# Patient Record
Sex: Male | Born: 1965 | Race: Black or African American | Hispanic: No | Marital: Single | Smoking: Current every day smoker
Health system: Southern US, Community
[De-identification: ages and names within clinical notes are randomized; demographics above are authoritative.]

## PROBLEM LIST (undated history)

## (undated) DIAGNOSIS — R079 Chest pain, unspecified: Secondary | ICD-10-CM

## (undated) DIAGNOSIS — B1920 Unspecified viral hepatitis C without hepatic coma: Secondary | ICD-10-CM

## (undated) DIAGNOSIS — E7800 Pure hypercholesterolemia, unspecified: Secondary | ICD-10-CM

## (undated) DIAGNOSIS — R0600 Dyspnea, unspecified: Secondary | ICD-10-CM

## (undated) DIAGNOSIS — C801 Malignant (primary) neoplasm, unspecified: Secondary | ICD-10-CM

## (undated) DIAGNOSIS — I10 Essential (primary) hypertension: Secondary | ICD-10-CM

## (undated) HISTORY — PX: JOINT REPLACEMENT: SHX530

## (undated) HISTORY — PX: PARTIAL HIP ARTHROPLASTY: SHX733

## (undated) HISTORY — DX: Essential (primary) hypertension: I10

## (undated) HISTORY — DX: Pure hypercholesterolemia, unspecified: E78.00

## (undated) HISTORY — DX: Unspecified viral hepatitis C without hepatic coma: B19.20

---
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
## 1998-05-23 ENCOUNTER — Inpatient Hospital Stay (HOSPITAL_COMMUNITY): Admission: RE | Admit: 1998-05-23 | Discharge: 1998-05-27 | Payer: Self-pay | Admitting: Orthopedic Surgery

## 1998-05-23 ENCOUNTER — Encounter: Payer: Self-pay | Admitting: Orthopedic Surgery

## 1999-01-12 ENCOUNTER — Inpatient Hospital Stay (HOSPITAL_COMMUNITY): Admission: EM | Admit: 1999-01-12 | Discharge: 1999-01-16 | Payer: Self-pay | Admitting: Emergency Medicine

## 1999-01-12 ENCOUNTER — Encounter: Payer: Self-pay | Admitting: Emergency Medicine

## 1999-02-04 ENCOUNTER — Encounter: Payer: Self-pay | Admitting: Emergency Medicine

## 1999-02-04 ENCOUNTER — Emergency Department (HOSPITAL_COMMUNITY): Admission: EM | Admit: 1999-02-04 | Discharge: 1999-02-04 | Payer: Self-pay | Admitting: Emergency Medicine

## 1999-02-07 ENCOUNTER — Inpatient Hospital Stay (HOSPITAL_COMMUNITY): Admission: EM | Admit: 1999-02-07 | Discharge: 1999-02-11 | Payer: Self-pay | Admitting: Emergency Medicine

## 1999-02-25 ENCOUNTER — Encounter: Admission: RE | Admit: 1999-02-25 | Discharge: 1999-02-28 | Payer: Self-pay | Admitting: Orthopedic Surgery

## 1999-08-13 ENCOUNTER — Emergency Department (HOSPITAL_COMMUNITY): Admission: EM | Admit: 1999-08-13 | Discharge: 1999-08-13 | Payer: Self-pay | Admitting: Emergency Medicine

## 2000-08-03 ENCOUNTER — Emergency Department (HOSPITAL_COMMUNITY): Admission: EM | Admit: 2000-08-03 | Discharge: 2000-08-03 | Payer: Self-pay | Admitting: Emergency Medicine

## 2000-08-03 ENCOUNTER — Encounter: Payer: Self-pay | Admitting: Emergency Medicine

## 2001-02-17 ENCOUNTER — Encounter: Payer: Self-pay | Admitting: Orthopedic Surgery

## 2001-02-17 ENCOUNTER — Encounter: Payer: Self-pay | Admitting: Emergency Medicine

## 2001-02-17 ENCOUNTER — Inpatient Hospital Stay (HOSPITAL_COMMUNITY): Admission: EM | Admit: 2001-02-17 | Discharge: 2001-02-19 | Payer: Self-pay | Admitting: Emergency Medicine

## 2001-11-11 ENCOUNTER — Emergency Department (HOSPITAL_COMMUNITY): Admission: EM | Admit: 2001-11-11 | Discharge: 2001-11-11 | Payer: Self-pay | Admitting: Emergency Medicine

## 2001-11-11 ENCOUNTER — Encounter: Payer: Self-pay | Admitting: Emergency Medicine

## 2002-06-17 ENCOUNTER — Emergency Department (HOSPITAL_COMMUNITY): Admission: EM | Admit: 2002-06-17 | Discharge: 2002-06-18 | Payer: Self-pay | Admitting: Emergency Medicine

## 2002-06-18 ENCOUNTER — Encounter: Payer: Self-pay | Admitting: Emergency Medicine

## 2002-10-22 ENCOUNTER — Emergency Department (HOSPITAL_COMMUNITY): Admission: EM | Admit: 2002-10-22 | Discharge: 2002-10-22 | Payer: Self-pay | Admitting: Emergency Medicine

## 2002-10-22 ENCOUNTER — Encounter: Payer: Self-pay | Admitting: Emergency Medicine

## 2003-02-08 ENCOUNTER — Encounter: Payer: Self-pay | Admitting: Orthopedic Surgery

## 2003-02-08 ENCOUNTER — Ambulatory Visit (HOSPITAL_COMMUNITY): Admission: RE | Admit: 2003-02-08 | Discharge: 2003-02-08 | Payer: Self-pay | Admitting: Orthopedic Surgery

## 2003-06-12 ENCOUNTER — Emergency Department (HOSPITAL_COMMUNITY): Admission: AD | Admit: 2003-06-12 | Discharge: 2003-06-12 | Payer: Self-pay | Admitting: Family Medicine

## 2003-06-20 ENCOUNTER — Emergency Department (HOSPITAL_COMMUNITY): Admission: AD | Admit: 2003-06-20 | Discharge: 2003-06-20 | Payer: Self-pay | Admitting: Family Medicine

## 2003-07-01 ENCOUNTER — Emergency Department (HOSPITAL_COMMUNITY): Admission: AD | Admit: 2003-07-01 | Discharge: 2003-07-01 | Payer: Self-pay | Admitting: Family Medicine

## 2005-08-10 ENCOUNTER — Emergency Department (HOSPITAL_COMMUNITY): Admission: EM | Admit: 2005-08-10 | Discharge: 2005-08-10 | Payer: Self-pay | Admitting: Emergency Medicine

## 2006-05-07 ENCOUNTER — Emergency Department (HOSPITAL_COMMUNITY): Admission: EM | Admit: 2006-05-07 | Discharge: 2006-05-07 | Payer: Self-pay | Admitting: Emergency Medicine

## 2006-10-16 ENCOUNTER — Emergency Department (HOSPITAL_COMMUNITY): Admission: EM | Admit: 2006-10-16 | Discharge: 2006-10-16 | Payer: Self-pay | Admitting: Emergency Medicine

## 2006-12-01 ENCOUNTER — Inpatient Hospital Stay (HOSPITAL_COMMUNITY): Admission: EM | Admit: 2006-12-01 | Discharge: 2006-12-04 | Payer: Self-pay | Admitting: *Deleted

## 2006-12-07 ENCOUNTER — Ambulatory Visit: Payer: Self-pay | Admitting: Internal Medicine

## 2006-12-19 ENCOUNTER — Emergency Department (HOSPITAL_COMMUNITY): Admission: EM | Admit: 2006-12-19 | Discharge: 2006-12-19 | Payer: Self-pay | Admitting: Emergency Medicine

## 2006-12-23 ENCOUNTER — Ambulatory Visit: Payer: Self-pay | Admitting: Internal Medicine

## 2006-12-27 ENCOUNTER — Emergency Department (HOSPITAL_COMMUNITY): Admission: EM | Admit: 2006-12-27 | Discharge: 2006-12-27 | Payer: Self-pay | Admitting: Emergency Medicine

## 2007-01-06 ENCOUNTER — Emergency Department (HOSPITAL_COMMUNITY): Admission: EM | Admit: 2007-01-06 | Discharge: 2007-01-06 | Payer: Self-pay | Admitting: Emergency Medicine

## 2007-01-21 ENCOUNTER — Ambulatory Visit: Payer: Self-pay | Admitting: Internal Medicine

## 2007-02-10 ENCOUNTER — Ambulatory Visit: Payer: Self-pay | Admitting: Internal Medicine

## 2007-04-25 ENCOUNTER — Encounter: Admission: RE | Admit: 2007-04-25 | Discharge: 2007-04-25 | Payer: Self-pay | Admitting: Internal Medicine

## 2007-04-25 ENCOUNTER — Ambulatory Visit: Payer: Self-pay | Admitting: Internal Medicine

## 2007-04-25 LAB — CONVERTED CEMR LAB
ALT: 83 units/L — ABNORMAL HIGH (ref 0–53)
AST: 45 units/L — ABNORMAL HIGH (ref 0–37)
Alkaline Phosphatase: 44 units/L (ref 39–117)
LDL Cholesterol: 75 mg/dL (ref 0–99)
Sodium: 142 meq/L (ref 135–145)
Total Bilirubin: 0.5 mg/dL (ref 0.3–1.2)
Total CHOL/HDL Ratio: 5.4
Total Protein: 7.5 g/dL (ref 6.0–8.3)
VLDL: 67 mg/dL — ABNORMAL HIGH (ref 0–40)

## 2007-10-06 ENCOUNTER — Ambulatory Visit: Payer: Self-pay | Admitting: Internal Medicine

## 2008-04-10 ENCOUNTER — Ambulatory Visit: Payer: Self-pay | Admitting: Internal Medicine

## 2008-04-10 LAB — CONVERTED CEMR LAB
Calcium: 10.2 mg/dL (ref 8.4–10.5)
Cholesterol: 212 mg/dL — ABNORMAL HIGH (ref 0–200)
Potassium: 4.3 meq/L (ref 3.5–5.3)
Sodium: 142 meq/L (ref 135–145)
Total CHOL/HDL Ratio: 4.5
Triglycerides: 155 mg/dL — ABNORMAL HIGH (ref <150)
VLDL: 31 mg/dL (ref 0–40)

## 2008-11-29 ENCOUNTER — Ambulatory Visit: Payer: Self-pay | Admitting: Internal Medicine

## 2009-09-02 ENCOUNTER — Ambulatory Visit: Payer: Self-pay | Admitting: Internal Medicine

## 2009-09-02 LAB — CONVERTED CEMR LAB
Albumin: 4.3 g/dL (ref 3.5–5.2)
Alkaline Phosphatase: 46 units/L (ref 39–117)
BUN: 25 mg/dL — ABNORMAL HIGH (ref 6–23)
CO2: 26 meq/L (ref 19–32)
Direct LDL: 93 mg/dL
Glucose, Bld: 173 mg/dL — ABNORMAL HIGH (ref 70–99)
Hgb A1c MFr Bld: 7.1 % — ABNORMAL HIGH (ref 4.6–6.1)
Microalb, Ur: 0.5 mg/dL (ref 0.00–1.89)
Potassium: 4.2 meq/L (ref 3.5–5.3)
Total Bilirubin: 0.5 mg/dL (ref 0.3–1.2)
Total Protein: 7.4 g/dL (ref 6.0–8.3)

## 2010-07-30 ENCOUNTER — Encounter (INDEPENDENT_AMBULATORY_CARE_PROVIDER_SITE_OTHER): Payer: Self-pay | Admitting: Family Medicine

## 2010-07-30 LAB — CONVERTED CEMR LAB
ALT: 96 units/L — ABNORMAL HIGH (ref 0–53)
AST: 54 units/L — ABNORMAL HIGH (ref 0–37)
Alkaline Phosphatase: 54 units/L (ref 39–117)
BUN: 20 mg/dL (ref 6–23)
Calcium: 9.7 mg/dL (ref 8.4–10.5)
Chloride: 99 meq/L (ref 96–112)
Creatinine, Ser: 1.24 mg/dL (ref 0.40–1.50)
HDL: 33 mg/dL — ABNORMAL LOW (ref >39)
LDL Cholesterol: 99 mg/dL (ref 0–99)
Total CHOL/HDL Ratio: 5.7
VLDL: 55 mg/dL — ABNORMAL HIGH (ref 0–40)

## 2010-11-10 ENCOUNTER — Emergency Department (HOSPITAL_COMMUNITY): Payer: Medicare Other

## 2010-11-10 ENCOUNTER — Emergency Department (HOSPITAL_COMMUNITY)
Admission: EM | Admit: 2010-11-10 | Discharge: 2010-11-11 | Disposition: A | Discharge status: Home / Self Care | Payer: Medicare Other | Attending: Emergency Medicine | Admitting: Emergency Medicine

## 2010-11-10 DIAGNOSIS — M79609 Pain in unspecified limb: Secondary | ICD-10-CM | POA: Insufficient documentation

## 2010-11-10 DIAGNOSIS — I10 Essential (primary) hypertension: Secondary | ICD-10-CM | POA: Insufficient documentation

## 2010-11-10 DIAGNOSIS — E119 Type 2 diabetes mellitus without complications: Secondary | ICD-10-CM | POA: Insufficient documentation

## 2010-11-10 DIAGNOSIS — IMO0002 Reserved for concepts with insufficient information to code with codable children: Secondary | ICD-10-CM | POA: Insufficient documentation

## 2010-11-10 DIAGNOSIS — Z794 Long term (current) use of insulin: Secondary | ICD-10-CM | POA: Insufficient documentation

## 2010-11-17 ENCOUNTER — Inpatient Hospital Stay (INDEPENDENT_AMBULATORY_CARE_PROVIDER_SITE_OTHER)
Admission: RE | Admit: 2010-11-17 | Discharge: 2010-11-17 | Disposition: A | Discharge status: Home / Self Care | Payer: Medicare Other | Source: Ambulatory Visit | Attending: Emergency Medicine | Admitting: Emergency Medicine

## 2010-11-17 DIAGNOSIS — M25529 Pain in unspecified elbow: Secondary | ICD-10-CM

## 2010-12-08 ENCOUNTER — Inpatient Hospital Stay (INDEPENDENT_AMBULATORY_CARE_PROVIDER_SITE_OTHER)
Admission: RE | Admit: 2010-12-08 | Discharge: 2010-12-08 | Disposition: A | Discharge status: Home / Self Care | Payer: Medicare Other | Source: Ambulatory Visit | Attending: Emergency Medicine | Admitting: Emergency Medicine

## 2010-12-08 ENCOUNTER — Ambulatory Visit (INDEPENDENT_AMBULATORY_CARE_PROVIDER_SITE_OTHER): Payer: Medicare Other

## 2010-12-08 ENCOUNTER — Ambulatory Visit (HOSPITAL_COMMUNITY): Payer: Medicare Other

## 2010-12-08 DIAGNOSIS — IMO0002 Reserved for concepts with insufficient information to code with codable children: Secondary | ICD-10-CM

## 2010-12-19 NOTE — Op Note (Signed)
[place]  Patient:    [name], UTTER                         MRN: [id number] Proc. Date: 02/17/01 Adm. Date:  [id number] Attending:  Mayme Genta                           Operative Report  PREOPERATIVE DIAGNOSIS: Post-traumatic dislocation of right total hip.  POSTOPERATIVE DIAGNOSIS: Post-traumatic dislocation of right total hip.  OPERATION/PROCEDURE: Closed manipulative reduction under anesthesia.  SURGEON: Sharmon Revere, M.D.  ANESTHESIA: General.  DESCRIPTION OF PROCEDURE: The patient was taken to the operating room after being given adequate preoperative medication and given general anesthesia and intubated.  The right hip was manipulated with traction and countertraction. The C-arm was used to visualize reduction and the hip was reduced.  Implants remained stable.  No evidence of loosening.  The patient tolerated the procedure quite well and went to the recovery room in stable and satisfactory condition.  The patient is being kept for 23 hour observation for pain control and to start physical therapy with the use of crutches, partial weightbearing on the right side, and to use pillows in between the legs while at bed rest. The patient will be discharged tomorrow on Percocet one q.4h p.r.n. for pain. Partial weightbearing on the right side.  Return to the office in two weeks. DD:  02/17/01 TD:  02/18/01 Job: [id number]
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
## 2010-12-19 NOTE — Discharge Summary (Signed)
[name], [name]                  ACCOUNT NO.:  [id number]   MEDICAL RECORD NO.:  [id number]          PATIENT TYPE:  INP   LOCATION:  9038                         FACILITY:  [facility]   PHYSICIAN:  Leana Gamer, MDDATE OF BIRTH:  [birth year]   DATE OF ADMISSION:  12/01/2006  DATE OF DISCHARGE:                               DISCHARGE SUMMARY   DATE OF ANTICIPATED DISCHARGE:  Dec 04, 2006.   DISCHARGE DISPOSITION:  Home.   FINAL DISCHARGE DIAGNOSES:  1. Mild diabetic ketoacidosis.  2. Diabetes type 2, new diagnosis.  3. Hypertension.  4. Hyperlipidemia.  5. Alcohol abuse.  6. Tobacco use disorder.  7. Fatty liver.  8. History of psychiatric disorder, unspecified at this time.   DISCHARGE MEDICATIONS:  1. Glucotrol 5 mg p.o. b.i.d.  2. Glucophage 500 mg p.o. b.i.d.  3. Lantus insulin 20 units subcu at bedtime.  4. Lisinopril 20 mg p.o. daily.  5. Hydrochlorothiazide 12.5 mg p.o. daily.  6. Pravachol 40 mg p.o. q.h.s.   CONSULTANTS:  None.   PROCEDURE:  None.   DIAGNOSTIC STUDIES:  CT of the abdomen and pelvis, which showed a fatty  liver and a slightly prominent appendix with no adjacent inflammation.   CHIEF COMPLAINT:  Abdominal pain and vomiting x4 days.   HISTORY OF PRESENT ILLNESS:  Please see the H&P dictated by Dr. Zigmund Daniel  for details of the HPI.   HOSPITAL COURSE:  1. Diabetic ketoacidosis.  The patient was found to be a new diagnosis      of diabetes type 2 and was in mild DKA.  The patient was given      aggressive hydration and IV insulin initially.  The anion gap      corrected with these measures and the patient was then transitioned      to oral antiglycemic medications.  The patient had poor control on      oral medications and did have some nausea associated with intake of      Glucophage.  Thus, the Glucophage was not any further titrated at      this time and the patient was started on Lantus insulin.  I expect      that the patient will  improve in terms of symptomatology from the      Glucophage and this can be titrated up and the patient should be      able to come off of insulin as his medications are titrated as an      outpatient.  However, I will leave that up to the skill and      discretion of the outpatient physician.  Please note that      microalbumin was checked on this patient.  The patient has minimal      microalbumin of 0.20 at this time.  2. Hypertension.  The patient was markedly hypertensive on arrival to      the emergency room and was given antihypertensive medications in      the emergency room.  The patient was thus started on lisinopril 40  mg and hydrochlorothiazide 12.5 mg; however, the patient did have      some symptoms of orthostasis and the lisinopril was decreased down      to 20 mg.  The patient is being discharged on lisinopril 20 mg p.o.      daily and hydrochlorothiazide 12.5 mg p.o. daily.  At this dosing,      the patient has no signs or symptoms of orthostasis and any further      titration will be done as an outpatient.  3. Hyperlipidemia.  The patient had a fasting lipid panel, which      indicated that he had elevated triglyceride and LDL levels.  The      patient was started on Zocor in the hospital and will be      transitioned to Pravachol, as an outpatient, 40 mg p.o. q.h.s.  The      patient was given diabetic teaching, goals in dieting, the      administration of his insulin.  He was given instruction on signs      and symptoms of hypo- and hyperglycemia and how to proceed in      either case.  The patient was also counseled against continued use      of alcohol and tobacco in light of these new diagnoses.  The      patient is in a precontemplated state for use of both of these;      however, reinforcement by his outpatient physician may very well      bring this patient into contemplative state and into a state of      cessation.  The patient, even though a heavy  drinker, exhibited no      signs and symptoms of withdrawal or delirium tremens while      hospitalized.  The patient has a significant other who appears to      be very involved in his care and he was also instructed in diabetic      education.  Dietary restrictions, the patient should be on a      diabetic, low-sodium, low-cholesterol diet.  Physical restriction,      none.   PERTINENT LABORATORY STUDIES:  On this admission, the patient had a  baseline hemoglobin A1c of 14.7.  Patient has a total cholesterol of  217, triglyceride level of 200, HDL of 31 and an LDL of 146.  Liver  enzymes, he has an alkaline phosphatase of 44, HGOT of 56, HGPT of 169.  Patient has a baseline creatinine of 1.23 and a BUN of 11, sodium of  135, potassium 4.4, chloride 99 and bicarb of 29.  He has a hemoglobin  of 13.6, hematocrit of 40.2 and a platelet count of 195 with a WBC count  of 5.3.   FOLLOWUP:  Patient to follow up with [facility].      Leana Gamer, MD  Electronically Signed     MAM/MEDQ  D:  12/04/2006  T:  12/04/2006  Job:  [id number]   cc:   [facility]

## 2010-12-19 NOTE — H&P (Signed)
[name], [name]                  ACCOUNT NO.:  [id number]   MEDICAL RECORD NO.:  [id number]          PATIENT TYPE:  INP   LOCATION:  2119                         FACILITY:  [facility]   PHYSICIAN:  Leana Gamer, MDDATE OF BIRTH:  05/31/1966   DATE OF ADMISSION:  12/01/2006  DATE OF DISCHARGE:  10/16/2006                              HISTORY & PHYSICAL   CHIEF COMPLAINT:  Abdominal pain with vomiting x4 days.   HISTORY OF PRESENT ILLNESS:  This is a 45 year old gentleman with a long-  standing history of excessive alcohol use who reports to the emergency  room with abdominal pain.  According to the patient, the abdominal pain  started 4 days ago.  He describes the pain as periumbilical, achy in  nature, nonradiating, and associated with emesis.  The patient states he  has approximately 15 episodes of emesis over the last 4 days.  Last  episode yesterday.  The patient states that the emesis consisted of  blood; however, the patient is unable to tell me whether it is bright  red blood or coffee ground emesis.  The patient states he has had no  episodes such as this in the past.   The patient denies any fever or chills.  He denies any diarrhea.  Please  note that the patient also used crack/cocaine approximately 4 days ago,  just prior to the onset of these symptoms.   During a routine workup, the patient was found to have a blood sugar of  480 and we were asked to see the patient for admission for new onset  diabetes.   PAST MEDICAL HISTORY:  Significant for:  1. A right total hip arthroplasty.  2. Questionable mental illness, which has allowed him to get      disability; however, the patient is not on any medications for his      mental illness.   SOCIAL HISTORY:  The patient is on disability.  Apparently, he has had a  longstanding history of incarceration, in and out of the hospital.  He  has excessive alcohol use, ranging anywhere from six 24-ounce beers to a  12-pack of  beer a day.  He does admit to 2 pack per day tobacco habits  for approximately 30 years.  As noted before, the patient does have  crack cocaine use.  He stated his last use was 4 days ago prior to  admission to the hospital.   FAMILY HISTORY:  The patient has diabetes in both his mother and his  father.  Hypertension and hyperlipidemia in a father who is now deceased  from complications of diabetes.   ALLERGIES:  NO KNOWN DRUG ALLERGIES.   MEDICATIONS:  No current medications.   REVIEW OF SYSTEMS:  Full 10 systems reviewed, all systems are negative,  except as noted in the HPI.  Please note the patient denies polyuria,  polydipsia or polyphagia.   LABORATORY STUDIES:  Done in the emergency room shows a sodium of 130,  potassium of 4.1, chloride of 93, bicarb 26, BUN 50, creatinine 1.2 and  a blood glucose  level of 480, lipase is 35.  White blood cell count 5.3,  hemoglobin of 13.6, platelets of 195.  A urinalysis is positive for  ketones, but negative for any signs of urinary tract infection.   PHYSICAL EXAMINATION:  GENERAL:  The patient is resting comfortably in  bed in no acute distress.  VITAL SIGNS:  Initial blood pressure was 152/102.  Blood pressure, at  time of my evaluation is 150/89.  Heart rate 66.  Respiratory rate 16.  Temperature 97.6.  The patient is 99% on room air.  HEENT EXAMINATION:  He is normocephalic, atraumatic.  Pupils are equally  round and reactive to light and accommodation.  Extraocular movements  are intact.  Tympanic membranes are translucent bilaterally with good  land marks.  Oral mucosa is moist.  No exudate, erythema or lesions are  noted.  NECK EXAMINATION:  Trachea is midline.  No masses.  No thyromegaly or  carotid bruit.  RESPIRATORY EXAMINATION:  Patient has normal respiratory effort.  Equal  excursion bilaterally.  No wheezes, rhonchi or rales noted.  No  increased vocal fremitus.  Clear to auscultation.  CARDIOVASCULAR:  He has got normal  S1 and S2.  No murmurs, rubs or  gallops or noted.  PMI is nondisplaced.  No heaves or thrills on  palpation.  ABDOMINAL EXAMINATION:  Patient's abdomen is soft, nontender and  nondistended.  No masses and no hepatosplenomegaly and the patient has  normoactive bowel sounds.  LYMPH:  There is no cervical or axillary inguinal  lymphadenopathy  noted.  MUSCULOSKELETAL:  Patient has functional range of motion in bilateral  upper and lower extremities albeit.  The range of motion in the right  hip is somewhat limited.  There is no warmth, swelling or erythema  around the joints.  There is no spinal tenderness.  NEUROLOGICAL:  Cranial nerves II-XII are grossly intact.  No focal  neurological deficits.  DTRs are 2+ bilaterally upper and lower  extremities.  Sensation is intact to light touch, pinprick, and  proprioception.  Strength is 4+/5 in bilateral upper and lower  extremities.  PSYCHIATRIC:  Patient is alert and oriented x3.  The patient has good  recent recall; however, his remote call appears somewhat impaired and  the patient appears to have some cognitive deficits.   ASSESSMENT/PLAN:  This is a patient who presents with:  1. New diagnosis of diabetes.  2. Mild diabetic ketoacidosis.  3. New diagnosis of hypertension, uncontrolled.  4. Alcohol dependence.  5. Tobacco use disorder.  6. History of mental illness.  7. Status post right total hip arthroplasty.   PLAN:  The patient will be admitted mainly for his new onset diabetes  with diabetic ketoacidosis and uncontrolled hypertension.  The patient  is being started on lisinopril 20 mg p.o. daily and hydrochlorothiazide  12.5 mg p.o. daily.  I would avoid any beta-blockers on this patient who  does admit to cocaine use and the patient is being started on ACE  inhibitor due to his diabetes and hypertension.  Patient will also receive hydralazine on a p.r.n. basis for elevated blood pressure beyond  the control of his basal  medications.  The patient will receive diabetic  education and will be started on IV fluids, normal saline with 20 of  potassium per liter at 100 mL per hour.  The patient will also receive  thiamine and folate.  The patient does have excessive use and he stopped  his alcohol use approximately 4 days ago.  The patient is in the time  period that he certainly should be going into DTs if he will; however,  at this time, the patient shows no signs and symptoms of agitation and  no anxiolytics have been started for this patient; however, I would  caution that, if the patient shows any signs of agitation, the physician  should be notified so that the patient can be evaluated and anxiolytics  started if necessary.  We will initiate diabetic and nutrition  education.  Check a fasting lipid panel on this patient.  Further  actions will be contingent upon tests results.  Patient does have  abdominal pain and so far the abdominal pain is unimpressive.  I imagine  that this is probably secondary to the diabetes and may be some group  gastroileitis that the patient had.  Patient will be put on GI  prophylaxis with  Protonix and on Lovenox.  I would refrain from aspirin use at this time  secondary to the patient's claim of bloody type emesis; however, the  patient has demonstrated none so far.  The patient will assume a heart  healthy diabetic diet and we will observe the patient and make further  decisions over the next 24 hours.      Leana Gamer, MD  Electronically Signed     MAM/MEDQ  D:  12/01/2006  T:  12/01/2006  Job:  [id number]

## 2011-01-28 DIAGNOSIS — E119 Type 2 diabetes mellitus without complications: Secondary | ICD-10-CM | POA: Insufficient documentation

## 2011-01-28 DIAGNOSIS — E7800 Pure hypercholesterolemia, unspecified: Secondary | ICD-10-CM

## 2011-01-28 DIAGNOSIS — E785 Hyperlipidemia, unspecified: Secondary | ICD-10-CM | POA: Insufficient documentation

## 2011-01-28 DIAGNOSIS — I10 Essential (primary) hypertension: Secondary | ICD-10-CM

## 2011-04-25 ENCOUNTER — Emergency Department (HOSPITAL_COMMUNITY)
Admission: EM | Admit: 2011-04-25 | Discharge: 2011-04-26 | Disposition: A | Discharge status: Home / Self Care | Payer: Medicare Other | Attending: Emergency Medicine | Admitting: Emergency Medicine

## 2011-04-25 DIAGNOSIS — I10 Essential (primary) hypertension: Secondary | ICD-10-CM | POA: Insufficient documentation

## 2011-04-25 DIAGNOSIS — Z794 Long term (current) use of insulin: Secondary | ICD-10-CM | POA: Insufficient documentation

## 2011-04-25 DIAGNOSIS — F1010 Alcohol abuse, uncomplicated: Secondary | ICD-10-CM | POA: Insufficient documentation

## 2011-04-25 DIAGNOSIS — E119 Type 2 diabetes mellitus without complications: Secondary | ICD-10-CM | POA: Insufficient documentation

## 2011-04-25 LAB — POCT I-STAT, CHEM 8
Calcium, Ion: 1.13 mmol/L (ref 1.12–1.32)
Chloride: 108 mEq/L (ref 96–112)
Glucose, Bld: 111 mg/dL — ABNORMAL HIGH (ref 70–99)
HCT: 51 % (ref 39.0–52.0)
TCO2: 22 mmol/L (ref 0–100)

## 2011-04-25 LAB — URINALYSIS, ROUTINE W REFLEX MICROSCOPIC
Bilirubin Urine: NEGATIVE
Ketones, ur: NEGATIVE mg/dL
Leukocytes, UA: NEGATIVE
Nitrite: NEGATIVE
Protein, ur: NEGATIVE mg/dL
Urobilinogen, UA: 0.2 mg/dL (ref 0.0–1.0)
pH: 5 (ref 5.0–8.0)

## 2011-04-25 LAB — GLUCOSE, CAPILLARY: Glucose-Capillary: 90 mg/dL (ref 70–99)

## 2011-05-21 LAB — BASIC METABOLIC PANEL
CO2: 30
Calcium: 9.8
Creatinine, Ser: 1.1
GFR calc non Af Amer: >60
Sodium: 135

## 2011-06-27 ENCOUNTER — Emergency Department (HOSPITAL_COMMUNITY): Payer: Medicare Other

## 2011-06-27 ENCOUNTER — Encounter (HOSPITAL_COMMUNITY): Payer: Self-pay

## 2011-06-27 ENCOUNTER — Inpatient Hospital Stay (HOSPITAL_COMMUNITY)
Admission: EM | Admit: 2011-06-27 | Discharge: 2011-07-03 | DRG: 903 | Disposition: A | Discharge status: Home / Self Care | Payer: Medicare Other | Attending: General Surgery | Admitting: General Surgery

## 2011-06-27 DIAGNOSIS — E119 Type 2 diabetes mellitus without complications: Secondary | ICD-10-CM | POA: Diagnosis present

## 2011-06-27 DIAGNOSIS — Z1881 Retained glass fragments: Secondary | ICD-10-CM

## 2011-06-27 DIAGNOSIS — Z7289 Other problems related to lifestyle: Secondary | ICD-10-CM | POA: Diagnosis present

## 2011-06-27 DIAGNOSIS — E785 Hyperlipidemia, unspecified: Secondary | ICD-10-CM | POA: Diagnosis present

## 2011-06-27 DIAGNOSIS — Z833 Family history of diabetes mellitus: Secondary | ICD-10-CM

## 2011-06-27 DIAGNOSIS — S139XXA Sprain of joints and ligaments of unspecified parts of neck, initial encounter: Secondary | ICD-10-CM | POA: Diagnosis present

## 2011-06-27 DIAGNOSIS — S51809A Unspecified open wound of unspecified forearm, initial encounter: Secondary | ICD-10-CM

## 2011-06-27 DIAGNOSIS — S161XXA Strain of muscle, fascia and tendon at neck level, initial encounter: Secondary | ICD-10-CM | POA: Diagnosis present

## 2011-06-27 DIAGNOSIS — I10 Essential (primary) hypertension: Secondary | ICD-10-CM | POA: Diagnosis present

## 2011-06-27 DIAGNOSIS — Z79899 Other long term (current) drug therapy: Secondary | ICD-10-CM

## 2011-06-27 DIAGNOSIS — E7800 Pure hypercholesterolemia, unspecified: Secondary | ICD-10-CM | POA: Diagnosis present

## 2011-06-27 DIAGNOSIS — S41109A Unspecified open wound of unspecified upper arm, initial encounter: Secondary | ICD-10-CM | POA: Diagnosis present

## 2011-06-27 DIAGNOSIS — F17200 Nicotine dependence, unspecified, uncomplicated: Secondary | ICD-10-CM | POA: Diagnosis present

## 2011-06-27 DIAGNOSIS — T148XXA Other injury of unspecified body region, initial encounter: Secondary | ICD-10-CM

## 2011-06-27 DIAGNOSIS — M542 Cervicalgia: Secondary | ICD-10-CM

## 2011-06-27 DIAGNOSIS — S51009A Unspecified open wound of unspecified elbow, initial encounter: Secondary | ICD-10-CM | POA: Diagnosis present

## 2011-06-27 DIAGNOSIS — S41112A Laceration without foreign body of left upper arm, initial encounter: Secondary | ICD-10-CM | POA: Diagnosis present

## 2011-06-27 DIAGNOSIS — Z8249 Family history of ischemic heart disease and other diseases of the circulatory system: Secondary | ICD-10-CM

## 2011-06-27 DIAGNOSIS — IMO0002 Reserved for concepts with insufficient information to code with codable children: Secondary | ICD-10-CM

## 2011-06-27 DIAGNOSIS — Z794 Long term (current) use of insulin: Secondary | ICD-10-CM

## 2011-06-27 DIAGNOSIS — F1010 Alcohol abuse, uncomplicated: Secondary | ICD-10-CM | POA: Diagnosis present

## 2011-06-27 DIAGNOSIS — Y92410 Unspecified street and highway as the place of occurrence of the external cause: Secondary | ICD-10-CM

## 2011-06-27 DIAGNOSIS — Z96649 Presence of unspecified artificial hip joint: Secondary | ICD-10-CM

## 2011-06-27 LAB — COMPREHENSIVE METABOLIC PANEL
ALT: 136 U/L — ABNORMAL HIGH (ref 0–53)
AST: 85 U/L — ABNORMAL HIGH (ref 0–37)
CO2: 21 mEq/L (ref 19–32)
Chloride: 100 mEq/L (ref 96–112)
GFR calc non Af Amer: 79 mL/min — ABNORMAL LOW (ref >90)
Sodium: 137 mEq/L (ref 135–145)
Total Bilirubin: 0.3 mg/dL (ref 0.3–1.2)

## 2011-06-27 LAB — CBC
Platelets: 184 10*3/uL (ref 150–400)
RDW: 13.2 % (ref 11.5–15.5)
WBC: 7.7 10*3/uL (ref 4.0–10.5)

## 2011-06-27 LAB — URINALYSIS, ROUTINE W REFLEX MICROSCOPIC
Bilirubin Urine: NEGATIVE
Hgb urine dipstick: NEGATIVE
Protein, ur: NEGATIVE mg/dL
Specific Gravity, Urine: 1.041 — ABNORMAL HIGH (ref 1.005–1.030)
Urobilinogen, UA: 0.2 mg/dL (ref 0.0–1.0)

## 2011-06-27 LAB — TYPE AND SCREEN: Antibody Screen: NEGATIVE

## 2011-06-27 LAB — DIFFERENTIAL
Basophils Absolute: 0 10*3/uL (ref 0.0–0.1)
Lymphocytes Relative: 50 % — ABNORMAL HIGH (ref 12–46)
Neutro Abs: 3.1 10*3/uL (ref 1.7–7.7)

## 2011-06-27 LAB — GLUCOSE, CAPILLARY

## 2011-06-27 LAB — RAPID URINE DRUG SCREEN, HOSP PERFORMED
Amphetamines: NOT DETECTED
Benzodiazepines: NOT DETECTED
Opiates: POSITIVE — AB
Tetrahydrocannabinol: NOT DETECTED

## 2011-06-27 LAB — ABO/RH: ABO/RH(D): A POS

## 2011-06-27 MED ORDER — HYDROMORPHONE HCL PF 1 MG/ML IJ SOLN
INTRAMUSCULAR | Status: AC
  Administered 2011-06-28: 2 mg via INTRAVENOUS
  Filled 2011-06-27: qty 1

## 2011-06-27 MED ORDER — DEXTROSE-NACL 5-0.9 % IV SOLN
INTRAVENOUS | Status: DC
  Administered 2011-06-27 – 2011-06-29 (×3): via INTRAVENOUS

## 2011-06-27 MED ORDER — HYDROMORPHONE HCL PF 2 MG/ML IJ SOLN
INTRAMUSCULAR | Status: AC
  Administered 2011-06-27: 1 mg
  Filled 2011-06-27: qty 1

## 2011-06-27 MED ORDER — ACETAMINOPHEN 650 MG RE SUPP: 650.0000 mg | Freq: Four times a day (QID) | RECTAL | Status: DC | PRN

## 2011-06-27 MED ORDER — OXYCODONE HCL 5 MG PO TABS
5.0000 mg | ORAL_TABLET | ORAL | Status: DC | PRN
  Administered 2011-06-29: 5 mg via ORAL
  Filled 2011-06-27: qty 1

## 2011-06-27 MED ORDER — ONDANSETRON HCL 4 MG/2ML IJ SOLN
INTRAMUSCULAR | Status: AC
  Administered 2011-06-27: 4 mg
  Filled 2011-06-27: qty 2

## 2011-06-27 MED ORDER — HYDROMORPHONE HCL PF 1 MG/ML IJ SOLN
1.0000 mg | Freq: Once | INTRAMUSCULAR | Status: AC
  Administered 2011-06-27: 1 mg via INTRAVENOUS
  Filled 2011-06-27: qty 1

## 2011-06-27 MED ORDER — THERA M PLUS PO TABS
1.0000 | ORAL_TABLET | Freq: Every day | ORAL | Status: DC
  Administered 2011-06-28 – 2011-07-03 (×5): 1 via ORAL
  Filled 2011-06-27 (×6): qty 1

## 2011-06-27 MED ORDER — THIAMINE HCL 100 MG/ML IJ SOLN
100.0000 mg | Freq: Every day | INTRAMUSCULAR | Status: DC
  Filled 2011-06-27 (×6): qty 1

## 2011-06-27 MED ORDER — ONDANSETRON HCL 4 MG/2ML IJ SOLN: 4.0000 mg | Freq: Four times a day (QID) | INTRAMUSCULAR | Status: DC | PRN

## 2011-06-27 MED ORDER — MORPHINE SULFATE 4 MG/ML IJ SOLN
4.0000 mg | Freq: Once | INTRAMUSCULAR | Status: AC
  Administered 2011-06-27: 4 mg via INTRAVENOUS
  Filled 2011-06-27: qty 1

## 2011-06-27 MED ORDER — MORPHINE SULFATE 4 MG/ML IJ SOLN
INTRAMUSCULAR | Status: AC
  Administered 2011-06-27: 4 mg
  Filled 2011-06-27: qty 1

## 2011-06-27 MED ORDER — CEFAZOLIN SODIUM 1-5 GM-% IV SOLN
1.0000 g | Freq: Three times a day (TID) | INTRAVENOUS | Status: DC
  Administered 2011-06-27 – 2011-07-03 (×16): 1 g via INTRAVENOUS
  Filled 2011-06-27 (×20): qty 50

## 2011-06-27 MED ORDER — HYDROMORPHONE HCL PF 2 MG/ML IJ SOLN
INTRAMUSCULAR | Status: AC
  Administered 2011-06-27: 2 mg
  Filled 2011-06-27: qty 1

## 2011-06-27 MED ORDER — LISINOPRIL 20 MG PO TABS
20.0000 mg | ORAL_TABLET | Freq: Every day | ORAL | Status: DC
  Administered 2011-06-28 – 2011-07-03 (×6): 20 mg via ORAL
  Filled 2011-06-27 (×8): qty 1

## 2011-06-27 MED ORDER — IOHEXOL 300 MG/ML  SOLN
100.0000 mL | Freq: Once | INTRAMUSCULAR | Status: AC | PRN
  Administered 2011-06-27: 100 mL via INTRAVENOUS

## 2011-06-27 MED ORDER — ACETAMINOPHEN 325 MG PO TABS: 650.0000 mg | ORAL_TABLET | Freq: Four times a day (QID) | ORAL | Status: DC | PRN

## 2011-06-27 MED ORDER — HYDROCHLOROTHIAZIDE 25 MG PO TABS
25.0000 mg | ORAL_TABLET | Freq: Every day | ORAL | Status: DC
  Administered 2011-06-28 – 2011-07-03 (×5): 25 mg via ORAL
  Filled 2011-06-27 (×8): qty 1

## 2011-06-27 MED ORDER — MORPHINE SULFATE 4 MG/ML IJ SOLN
4.0000 mg | Freq: Once | INTRAMUSCULAR | Status: AC
  Administered 2011-06-27: 4 mg via INTRAVENOUS

## 2011-06-27 MED ORDER — LORAZEPAM 2 MG/ML IJ SOLN
1.0000 mg | Freq: Four times a day (QID) | INTRAMUSCULAR | Status: AC | PRN
  Administered 2011-06-28: 1 mg via INTRAVENOUS
  Filled 2011-06-27: qty 1

## 2011-06-27 MED ORDER — LORAZEPAM 1 MG PO TABS
1.0000 mg | ORAL_TABLET | Freq: Four times a day (QID) | ORAL | Status: AC | PRN
  Administered 2011-06-29 (×2): 1 mg via ORAL
  Filled 2011-06-27 (×2): qty 1

## 2011-06-27 MED ORDER — MORPHINE SULFATE 4 MG/ML IJ SOLN: 4.0000 mg | Freq: Once | INTRAMUSCULAR | Status: DC

## 2011-06-27 MED ORDER — HYDROMORPHONE HCL PF 1 MG/ML IJ SOLN
INTRAMUSCULAR | Status: AC
  Administered 2011-06-27: 1 mg via INTRAVENOUS
  Filled 2011-06-27: qty 1

## 2011-06-27 MED ORDER — HYDROMORPHONE HCL PF 1 MG/ML IJ SOLN
1.0000 mg | Freq: Once | INTRAMUSCULAR | Status: DC
  Administered 2011-06-27: 1 mg via INTRAVENOUS

## 2011-06-27 MED ORDER — TETANUS-DIPHTH-ACELL PERTUSSIS 5-2.5-18.5 LF-MCG/0.5 IM SUSP
INTRAMUSCULAR | Status: AC
  Filled 2011-06-27: qty 0.5

## 2011-06-27 MED ORDER — INSULIN ASPART 100 UNIT/ML ~~LOC~~ SOLN
0.0000 [IU] | SUBCUTANEOUS | Status: DC
  Administered 2011-06-27 – 2011-06-28 (×2): 8 [IU] via SUBCUTANEOUS
  Administered 2011-06-28: 3 [IU] via SUBCUTANEOUS
  Administered 2011-06-28 (×3): 8 [IU] via SUBCUTANEOUS
  Administered 2011-06-29: 5 [IU] via SUBCUTANEOUS
  Administered 2011-06-29: 3 [IU] via SUBCUTANEOUS
  Administered 2011-06-29: 5 [IU] via SUBCUTANEOUS
  Administered 2011-06-29: 3 [IU] via SUBCUTANEOUS
  Administered 2011-06-29: 2 [IU] via SUBCUTANEOUS
  Administered 2011-06-29: 3 [IU] via SUBCUTANEOUS
  Administered 2011-06-30: 2 [IU] via SUBCUTANEOUS
  Administered 2011-06-30: 3 [IU] via SUBCUTANEOUS
  Administered 2011-06-30: 5 [IU] via SUBCUTANEOUS
  Administered 2011-06-30: 2 [IU] via SUBCUTANEOUS
  Administered 2011-06-30: 3 [IU] via SUBCUTANEOUS
  Administered 2011-07-01: 2 [IU] via SUBCUTANEOUS
  Administered 2011-07-01 – 2011-07-02 (×3): 3 [IU] via SUBCUTANEOUS
  Administered 2011-07-02: 2 [IU] via SUBCUTANEOUS
  Administered 2011-07-02: 5 [IU] via SUBCUTANEOUS
  Administered 2011-07-02: 8 [IU] via SUBCUTANEOUS
  Administered 2011-07-03 (×2): 3 [IU] via SUBCUTANEOUS
  Administered 2011-07-03 (×2): 5 [IU] via SUBCUTANEOUS
  Filled 2011-06-27: qty 3

## 2011-06-27 MED ORDER — CEFAZOLIN SODIUM 1-5 GM-% IV SOLN
1.0000 g | Freq: Once | INTRAVENOUS | Status: AC
  Administered 2011-06-27 – 2011-06-28 (×2): 1 g via INTRAVENOUS
  Filled 2011-06-27: qty 50

## 2011-06-27 MED ORDER — TETANUS-DIPHTHERIA TOXOIDS TD 5-2 LFU IM INJ
0.5000 mL | INJECTION | Freq: Once | INTRAMUSCULAR | Status: AC
  Administered 2011-06-27: 0.5 mL via INTRAMUSCULAR

## 2011-06-27 MED ORDER — SODIUM CHLORIDE 0.9 % IV BOLUS (SEPSIS)
1000.0000 mL | Freq: Once | INTRAVENOUS | Status: AC
  Administered 2011-06-27: 1000 mL via INTRAVENOUS

## 2011-06-27 MED ORDER — HYDROMORPHONE HCL PF 1 MG/ML IJ SOLN
2.0000 mg | INTRAMUSCULAR | Status: DC | PRN
  Administered 2011-06-27 – 2011-06-29 (×12): 2 mg via INTRAVENOUS
  Filled 2011-06-27 (×12): qty 2

## 2011-06-27 MED ORDER — VITAMIN B-1 100 MG PO TABS
100.0000 mg | ORAL_TABLET | Freq: Every day | ORAL | Status: DC
  Administered 2011-06-28 – 2011-07-03 (×5): 100 mg via ORAL
  Filled 2011-06-27 (×6): qty 1

## 2011-06-27 MED ORDER — FOLIC ACID 1 MG PO TABS
1.0000 mg | ORAL_TABLET | Freq: Every day | ORAL | Status: DC
  Administered 2011-06-28 – 2011-07-03 (×5): 1 mg via ORAL
  Filled 2011-06-27 (×6): qty 1

## 2011-06-27 MED ORDER — INSULIN ASPART 100 UNIT/ML ~~LOC~~ SOLN
SUBCUTANEOUS | Status: AC
  Administered 2011-06-27: 8 [IU] via SUBCUTANEOUS
  Filled 2011-06-27: qty 1

## 2011-06-27 NOTE — ED Notes (Signed)
Pt request pain medication, Dr. Justin Mend made aware.

## 2011-06-27 NOTE — ED Notes (Signed)
Attempted to call report to floor RN x1 - RN unavailable at this time.
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
## 2011-06-27 NOTE — ED Notes (Signed)
Family at beside. Family given emotional support. 
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
## 2011-06-28 ENCOUNTER — Encounter (HOSPITAL_COMMUNITY): Admission: EM | Disposition: A | Discharge status: Home / Self Care | Payer: Self-pay | Source: Home / Self Care

## 2011-06-28 ENCOUNTER — Inpatient Hospital Stay (HOSPITAL_COMMUNITY): Payer: Medicare Other

## 2011-06-28 ENCOUNTER — Encounter (HOSPITAL_COMMUNITY): Payer: Self-pay

## 2011-06-28 HISTORY — PX: I & D EXTREMITY: SHX5045

## 2011-06-28 LAB — GLUCOSE, CAPILLARY
Glucose-Capillary: 260 mg/dL — ABNORMAL HIGH (ref 70–99)
Glucose-Capillary: 228 mg/dL — ABNORMAL HIGH (ref 70–99)
Glucose-Capillary: 295 mg/dL — ABNORMAL HIGH (ref 70–99)

## 2011-06-28 LAB — BASIC METABOLIC PANEL
BUN: 11 mg/dL (ref 6–23)
Chloride: 100 mEq/L (ref 96–112)
Glucose, Bld: 264 mg/dL — ABNORMAL HIGH (ref 70–99)
Potassium: 4.1 mEq/L (ref 3.5–5.1)

## 2011-06-28 LAB — CBC
HCT: 32.7 % — ABNORMAL LOW (ref 39.0–52.0)
Hemoglobin: 10.9 g/dL — ABNORMAL LOW (ref 13.0–17.0)
MCH: 29.5 pg (ref 26.0–34.0)
MCHC: 33.3 g/dL (ref 30.0–36.0)
MCV: 88.4 fL (ref 78.0–100.0)

## 2011-06-28 SURGERY — IRRIGATION AND DEBRIDEMENT EXTREMITY
Anesthesia: General | Site: Arm Lower | Laterality: Left | Wound class: Contaminated

## 2011-06-28 MED ORDER — HYDROMORPHONE HCL PF 1 MG/ML IJ SOLN
INTRAMUSCULAR | Status: AC
  Administered 2011-06-28: 0.5 mg via INTRAVENOUS
  Filled 2011-06-28: qty 1

## 2011-06-28 MED ORDER — ONDANSETRON HCL 4 MG/2ML IJ SOLN: 4.0000 mg | Freq: Once | INTRAMUSCULAR | Status: AC | PRN

## 2011-06-28 MED ORDER — HYDROMORPHONE HCL PF 1 MG/ML IJ SOLN
0.2500 mg | INTRAMUSCULAR | Status: DC | PRN
  Administered 2011-06-28 (×4): 0.5 mg via INTRAVENOUS

## 2011-06-28 MED ORDER — PROPOFOL 10 MG/ML IV EMUL
INTRAVENOUS | Status: DC | PRN
  Administered 2011-06-28: 100 mg via INTRAVENOUS

## 2011-06-28 MED ORDER — MEPERIDINE HCL 25 MG/ML IJ SOLN: 6.2500 mg | INTRAMUSCULAR | Status: DC | PRN

## 2011-06-28 MED ORDER — FENTANYL CITRATE 0.05 MG/ML IJ SOLN
INTRAMUSCULAR | Status: DC | PRN
  Administered 2011-06-28: 100 ug via INTRAVENOUS
  Administered 2011-06-28: 150 ug via INTRAVENOUS

## 2011-06-28 MED ORDER — ONDANSETRON HCL 4 MG/2ML IJ SOLN
INTRAMUSCULAR | Status: DC | PRN
  Administered 2011-06-28: 4 mg via INTRAVENOUS

## 2011-06-28 MED ORDER — SODIUM CHLORIDE 0.9 % IR SOLN
Status: DC | PRN
  Administered 2011-06-28: 3000 mL

## 2011-06-28 MED ORDER — MIDAZOLAM HCL 5 MG/5ML IJ SOLN
INTRAMUSCULAR | Status: DC | PRN
  Administered 2011-06-28: 2 mg via INTRAVENOUS

## 2011-06-28 MED ORDER — LACTATED RINGERS IV SOLN
INTRAVENOUS | Status: DC | PRN
  Administered 2011-06-28 (×2): via INTRAVENOUS

## 2011-06-28 MED ORDER — PHENYLEPHRINE HCL 10 MG/ML IJ SOLN
INTRAMUSCULAR | Status: DC | PRN
  Administered 2011-06-28: 160 ug via INTRAVENOUS
  Administered 2011-06-28: 240 ug via INTRAVENOUS
  Administered 2011-06-28: 400 ug via INTRAVENOUS
  Administered 2011-06-28 (×3): 160 ug via INTRAVENOUS

## 2011-06-28 MED ORDER — SUCCINYLCHOLINE CHLORIDE 20 MG/ML IJ SOLN
INTRAMUSCULAR | Status: DC | PRN
  Administered 2011-06-28: 100 mg via INTRAVENOUS

## 2011-06-28 MED ORDER — LIDOCAINE HCL (CARDIAC) 20 MG/ML IV SOLN
INTRAVENOUS | Status: DC | PRN
  Administered 2011-06-28: 50 mg via INTRAVENOUS

## 2011-06-28 SURGICAL SUPPLY — 59 items
BANDAGE CONFORM 2  STR LF (GAUZE/BANDAGES/DRESSINGS) IMPLANT
BANDAGE ELASTIC 3 VELCRO ST LF (GAUZE/BANDAGES/DRESSINGS) ×1 IMPLANT
BANDAGE ELASTIC 4 VELCRO ST LF (GAUZE/BANDAGES/DRESSINGS) ×1 IMPLANT
BANDAGE GAUZE ELAST BULKY 4 IN (GAUZE/BANDAGES/DRESSINGS) ×3 IMPLANT
BNDG CMPR 9X4 STRL LF SNTH (GAUZE/BANDAGES/DRESSINGS) ×1
BNDG COHESIVE 1X5 TAN STRL LF (GAUZE/BANDAGES/DRESSINGS) IMPLANT
BNDG ESMARK 4X9 LF (GAUZE/BANDAGES/DRESSINGS) ×2 IMPLANT
CLOTH BEACON ORANGE TIMEOUT ST (SAFETY) ×2 IMPLANT
CORDS BIPOLAR (ELECTRODE) ×2 IMPLANT
COVER SURGICAL LIGHT HANDLE (MISCELLANEOUS) ×2 IMPLANT
CUFF TOURNIQUET SINGLE 18IN (TOURNIQUET CUFF) ×2 IMPLANT
CUFF TOURNIQUET SINGLE 24IN (TOURNIQUET CUFF) IMPLANT
DRAIN PENROSE 1/4X12 LTX STRL (WOUND CARE) IMPLANT
DRAPE INCISE IOBAN 66X45 STRL (DRAPES) ×1 IMPLANT
DRAPE SURG 17X23 STRL (DRAPES) ×2 IMPLANT
DRAPE X-RAY CASS 24X20 (DRAPES) ×1 IMPLANT
DRSG ADAPTIC 3X8 NADH LF (GAUZE/BANDAGES/DRESSINGS) ×2 IMPLANT
DRSG VAC ATS MED SENSATRAC (GAUZE/BANDAGES/DRESSINGS) ×1 IMPLANT
ELECT REM PT RETURN 9FT ADLT (ELECTROSURGICAL) ×2
ELECTRODE REM PT RTRN 9FT ADLT (ELECTROSURGICAL) IMPLANT
GAUZE SPONGE 4X4 12PLY STRL LF (GAUZE/BANDAGES/DRESSINGS) ×1 IMPLANT
GAUZE XEROFORM 1X8 LF (GAUZE/BANDAGES/DRESSINGS) ×2 IMPLANT
GAUZE XEROFORM 5X9 LF (GAUZE/BANDAGES/DRESSINGS) IMPLANT
GLOVE BIOGEL PI IND STRL 8.5 (GLOVE) ×1 IMPLANT
GLOVE BIOGEL PI INDICATOR 8.5 (GLOVE) ×1
GLOVE SURG ORTHO 8.0 STRL STRW (GLOVE) ×2 IMPLANT
GOWN PREVENTION PLUS XLARGE (GOWN DISPOSABLE) ×3 IMPLANT
GOWN STRL NON-REIN LRG LVL3 (GOWN DISPOSABLE) ×3 IMPLANT
HANDPIECE INTERPULSE COAX TIP (DISPOSABLE) ×4
IV CYSTO TUBING LATEX FREE (IV SOLUTION) IMPLANT
KIT BASIN OR (CUSTOM PROCEDURE TRAY) ×2 IMPLANT
KIT ROOM TURNOVER OR (KITS) ×2 IMPLANT
MANIFOLD NEPTUNE II (INSTRUMENTS) ×2 IMPLANT
NDL HYPO 25GX1X1/2 BEV (NEEDLE) IMPLANT
NEEDLE HYPO 25GX1X1/2 BEV (NEEDLE) IMPLANT
NS IRRIG 1000ML POUR BTL (IV SOLUTION) ×2 IMPLANT
PACK ORTHO EXTREMITY (CUSTOM PROCEDURE TRAY) ×2 IMPLANT
PAD ARMBOARD 7.5X6 YLW CONV (MISCELLANEOUS) ×2 IMPLANT
PAD CAST 4YDX4 CTTN HI CHSV (CAST SUPPLIES) ×1 IMPLANT
PAD NEG PRESSURE SENSATRAC (MISCELLANEOUS) ×1 IMPLANT
PADDING CAST COTTON 4X4 STRL (CAST SUPPLIES)
SET HNDPC FAN SPRY TIP SCT (DISPOSABLE) IMPLANT
SOAP 2 % CHG 4 OZ (WOUND CARE) ×2 IMPLANT
SPONGE GAUZE 4X4 12PLY (GAUZE/BANDAGES/DRESSINGS) ×2 IMPLANT
SPONGE LAP 18X18 X RAY DECT (DISPOSABLE) ×2 IMPLANT
SPONGE LAP 4X18 X RAY DECT (DISPOSABLE) ×1 IMPLANT
SUCTION FRAZIER TIP 10 FR DISP (SUCTIONS) ×2 IMPLANT
SUT ETHILON 4 0 PS 2 18 (SUTURE) IMPLANT
SUT ETHILON 5 0 P 3 18 (SUTURE)
SUT NYLON ETHILON 5-0 P-3 1X18 (SUTURE) ×1 IMPLANT
SWAB CULTURE LIQ STUART DBL (MISCELLANEOUS) IMPLANT
SYR CONTROL 10ML LL (SYRINGE) IMPLANT
TOWEL OR 17X24 6PK STRL BLUE (TOWEL DISPOSABLE) ×2 IMPLANT
TOWEL OR 17X26 10 PK STRL BLUE (TOWEL DISPOSABLE) ×2 IMPLANT
TUBE ANAEROBIC SPECIMEN COL (MISCELLANEOUS) IMPLANT
TUBE CONNECTING 12X1/4 (SUCTIONS) ×2 IMPLANT
UNDERPAD 30X30 INCONTINENT (UNDERPADS AND DIAPERS) ×2 IMPLANT
WATER STERILE IRR 1000ML POUR (IV SOLUTION) ×1 IMPLANT
YANKAUER SUCT BULB TIP NO VENT (SUCTIONS) ×2 IMPLANT
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
## 2011-06-28 NOTE — Preoperative (Addendum)
Beta Blockers   Reason not to administer Beta Blockers:Not Applicable 

## 2011-06-28 NOTE — Brief Op Note (Signed)
06/27/2011 - 06/28/2011  9:21 AM  PATIENT:  [name]  45 y.o. male  PRE-OPERATIVE DIAGNOSIS:  Left forearm wound  POST-OPERATIVE DIAGNOSIS:  left upper and lower left arm wound  PROCEDURE:  Procedure(s): IRRIGATION AND DEBRIDEMENT EXTREMITY LUE WOUND 3 SEPARATE WOUNDS 4X5 CM [id number] AND [id number] APPLICATION OF WOUND VAC  SURGEON:  Surgeon(s): Linna Hoff  PHYSICIAN ASSISTANT:   ASSISTANTS: none   ANESTHESIA:   general  EBL:  Total I/O In: 1000 [I.V.:1000] Out: 350 [Urine:250; Blood:100]  BLOOD ADMINISTERED:none  DRAINS: none   LOCAL MEDICATIONS USED:  NONE  SPECIMEN:  No Specimen  DISPOSITION OF SPECIMEN:  N/A  COUNTS:  YES  TOURNIQUET:  * No tourniquets in log *  DICTATION: .[id number] JOB ID  PLAN OF CARE: Admit to inpatient   PATIENT DISPOSITION:  PACU - hemodynamically stable.   Delay start of Pharmacological VTE agent (>24hrs) due to surgical blood loss or risk of bleeding:  {YES/NO/NOT APPLICABLE:20182

## 2011-06-28 NOTE — Anesthesia Preprocedure Evaluation (Addendum)
Anesthesia Evaluation  Patient identified by MRN, date of birth, ID band Patient awake    Reviewed: Allergy & Precautions, H&P , Patient's Chart, lab work & pertinent test results  Airway       Dental  (+) Teeth Intact   Pulmonary          Cardiovascular hypertension, Pt. on medications Regular     Neuro/Psych    GI/Hepatic   Endo/Other  Diabetes mellitus-, Poorly Controlled, Type 2, Oral Hypoglycemic Agents  Renal/GU      Musculoskeletal   Abdominal (+) obese,   Peds  Hematology   Anesthesia Other Findings   Reproductive/Obstetrics                         Anesthesia Physical Anesthesia Plan  ASA: [name]  Anesthesia Plan: General   Post-op Pain Management:    Induction:   Airway Management Planned:   Additional Equipment:   Intra-op Plan:   Post-operative Plan:   Informed Consent: I have reviewed the patients History and Physical, chart, labs and discussed the procedure including the risks, benefits and alternatives for the proposed anesthesia with the patient or authorized representative who has indicated his/her understanding and acceptance.   Dental advisory given  Plan Discussed with: CRNA and Surgeon  Anesthesia Plan Comments:        Anesthesia Quick Evaluation

## 2011-06-28 NOTE — Progress Notes (Signed)
Patient states " I have artificial right hip"

## 2011-06-28 NOTE — Anesthesia Postprocedure Evaluation (Signed)
  Anesthesia Post-op Note  Patient: [name]  Procedure(s) Performed:  IRRIGATION AND DEBRIDEMENT EXTREMITY - with application of wound vac  Patient Location: PACU  Anesthesia Type: General  Level of Consciousness: alert   Airway and Oxygen Therapy: Patient connected to nasal cannula oxygen  Post-op Pain: moderate  Post-op Assessment: Post-op Vital signs reviewed  Post-op Vital Signs: stable  Complications: No apparent anesthesia complications
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
## 2011-06-28 NOTE — Transfer of Care (Signed)
Immediate Anesthesia Transfer of Care Note  Patient: [name]  Procedure(s) Performed:  IRRIGATION AND DEBRIDEMENT EXTREMITY - with application of wound vac  Patient Location: PACU  Anesthesia Type: General  Level of Consciousness: awake, alert  and oriented  Airway & Oxygen Therapy: Patient Spontanous Breathing and Patient connected to nasal cannula oxygen  Post-op Assessment: Report given to PACU RN, Post -op Vital signs reviewed and stable and Patient able to stick tongue midline  Post vital signs: Reviewed and stable  Complications: No apparent anesthesia complications

## 2011-06-28 NOTE — Anesthesia Procedure Notes (Addendum)
Performed by: Elige Ko F   Procedure Name: Intubation Date/Time: 06/28/2011 7:55 AM Performed by: Neldon Newport Pre-anesthesia Checklist: Emergency Drugs available, Patient identified, Timeout performed, Suction available and Patient being monitored Patient Re-evaluated:Patient Re-evaluated prior to inductionOxygen Delivery Method: Circle System Utilized Preoxygenation: Pre-oxygenation with 100% oxygen Intubation Type: IV induction, Rapid sequence and Circoid Pressure applied Laryngoscope Size: Charlyne Petrin, 2 and 3 Grade View: Grade [name] Tube type: Oral Tube size: 7.5 mm Number of attempts: 3 Placement Confirmation: ETT inserted through vocal cords under direct vision,  positive ETCO2,  CO2 detector and breath sounds checked- equal and bilateral Secured at: 23 cm Tube secured with: Tape Dental Injury: Teeth and Oropharynx as per pre-operative assessment  Difficulty Due To: Difficulty was unanticipated

## 2011-06-29 DIAGNOSIS — S161XXA Strain of muscle, fascia and tendon at neck level, initial encounter: Secondary | ICD-10-CM | POA: Diagnosis present

## 2011-06-29 DIAGNOSIS — S41112A Laceration without foreign body of left upper arm, initial encounter: Secondary | ICD-10-CM | POA: Diagnosis present

## 2011-06-29 DIAGNOSIS — Z7289 Other problems related to lifestyle: Secondary | ICD-10-CM | POA: Diagnosis present

## 2011-06-29 LAB — GLUCOSE, CAPILLARY
Glucose-Capillary: 202 mg/dL — ABNORMAL HIGH (ref 70–99)
Glucose-Capillary: 139 mg/dL — ABNORMAL HIGH (ref 70–99)

## 2011-06-29 MED ORDER — HYDROMORPHONE HCL PF 1 MG/ML IJ SOLN
0.5000 mg | INTRAMUSCULAR | Status: DC | PRN
  Administered 2011-06-29 – 2011-07-03 (×4): 0.5 mg via INTRAVENOUS
  Filled 2011-06-29 (×6): qty 1

## 2011-06-29 MED ORDER — ENOXAPARIN SODIUM 40 MG/0.4ML ~~LOC~~ SOLN
40.0000 mg | Freq: Every day | SUBCUTANEOUS | Status: DC
  Administered 2011-06-29 – 2011-07-03 (×4): 40 mg via SUBCUTANEOUS
  Filled 2011-06-29 (×5): qty 0.4

## 2011-06-29 MED ORDER — METHOCARBAMOL 500 MG PO TABS
500.0000 mg | ORAL_TABLET | Freq: Four times a day (QID) | ORAL | Status: DC | PRN
  Administered 2011-06-29: 1000 mg via ORAL
  Administered 2011-07-01 (×2): 500 mg via ORAL
  Administered 2011-07-03 (×2): 1000 mg via ORAL
  Filled 2011-06-29 (×2): qty 2
  Filled 2011-06-29 (×2): qty 1
  Filled 2011-06-29: qty 2

## 2011-06-29 MED ORDER — NAPROXEN 500 MG PO TABS
500.0000 mg | ORAL_TABLET | Freq: Two times a day (BID) | ORAL | Status: DC
  Administered 2011-06-29 – 2011-07-03 (×7): 500 mg via ORAL
  Filled 2011-06-29 (×10): qty 1

## 2011-06-29 MED ORDER — METFORMIN HCL 500 MG PO TABS
1000.0000 mg | ORAL_TABLET | Freq: Two times a day (BID) | ORAL | Status: DC
  Administered 2011-06-29 – 2011-07-03 (×8): 1000 mg via ORAL
  Filled 2011-06-29 (×10): qty 2

## 2011-06-29 MED ORDER — INSULIN GLARGINE 100 UNIT/ML ~~LOC~~ SOLN
20.0000 [IU] | Freq: Every day | SUBCUTANEOUS | Status: DC
  Administered 2011-06-29 – 2011-07-02 (×3): 20 [IU] via SUBCUTANEOUS
  Filled 2011-06-29: qty 3

## 2011-06-29 MED ORDER — OXYCODONE HCL 5 MG PO TABS
10.0000 mg | ORAL_TABLET | ORAL | Status: DC | PRN
  Administered 2011-06-29 (×2): 20 mg via ORAL
  Administered 2011-06-30 (×2): 10 mg via ORAL
  Administered 2011-07-01 – 2011-07-03 (×5): 20 mg via ORAL
  Filled 2011-06-29: qty 4
  Filled 2011-06-29 (×2): qty 2
  Filled 2011-06-29 (×4): qty 4
  Filled 2011-06-29: qty 2
  Filled 2011-06-29 (×3): qty 4

## 2011-06-29 MED ORDER — SIMVASTATIN 40 MG PO TABS
40.0000 mg | ORAL_TABLET | Freq: Every day | ORAL | Status: DC
  Administered 2011-06-29 – 2011-07-03 (×5): 40 mg via ORAL
  Filled 2011-06-29 (×5): qty 1

## 2011-06-29 MED ORDER — GLIPIZIDE 5 MG PO TABS
5.0000 mg | ORAL_TABLET | Freq: Every day | ORAL | Status: DC
  Administered 2011-06-29 – 2011-07-03 (×4): 5 mg via ORAL
  Filled 2011-06-29 (×5): qty 1

## 2011-06-29 NOTE — Progress Notes (Signed)
On CIWA as drinks 12pack daily Patient examined and I agree with the assessment and plan  Courtnie Brenes E

## 2011-06-29 NOTE — Progress Notes (Signed)
Subjective: Pt doing ok   Objective: Vital signs in last 24 hours: Temp:  [98.8 F (37.1 C)-99.4 F (37.4 C)] 99.3 F (37.4 C) (11/26 0515) Pulse Rate:  [80-93] 93  (11/26 0515) Resp:  [18-22] 20  (11/26 0515) BP: (103-147)/(51-75) 147/69 mmHg (11/26 0515) SpO2:  [97 %-100 %] 100 % (11/26 0515)  Intake/Output from previous day: 11/25 0701 - 11/26 0700 In: 4358.3 [I.V.:4108.3; IV Piggyback:250] Out: 350 [Urine:250; Blood:100] Intake/Output this shift: Total I/O In: -  Out: 300 [Urine:300]   Basename 06/28/11 1140 06/27/11 1552  HGB 10.9* 15.1    Basename 06/28/11 1140 06/27/11 1552  WBC 9.0 7.7  RBC 3.70* 5.01  HCT 32.7* 43.3  PLT 183 184    Basename 06/28/11 1140 06/27/11 1552  NA 134* 137  K 4.1 3.7  CL 100 100  CO2 25 21  BUN 11 9  CREATININE 1.00 1.11  GLUCOSE 264* 323*  CALCIUM 7.9* 9.0   No results found for this basename: LABPT:2,INR:2 in the last 72 hours  Vac in place able to gently wiggle digits fingers warm well perfused   Assessment/Plan: Continue with wound vac Return to or on Wednesday for repeat debridement and or skin grafting Will continue to follow   MCGWIRE, DASARO 06/29/2011, 12:34 PM
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
## 2011-06-30 ENCOUNTER — Encounter (HOSPITAL_COMMUNITY): Payer: Self-pay | Admitting: Orthopedic Surgery

## 2011-06-30 LAB — GLUCOSE, CAPILLARY
Glucose-Capillary: 137 mg/dL — ABNORMAL HIGH (ref 70–99)
Glucose-Capillary: 110 mg/dL — ABNORMAL HIGH (ref 70–99)
Glucose-Capillary: 135 mg/dL — ABNORMAL HIGH (ref 70–99)

## 2011-06-30 NOTE — Progress Notes (Signed)
Agree [name] E
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
## 2011-06-30 NOTE — Progress Notes (Signed)
Surgery postponed until Thursday i want to look at wound tomorrow and change wound vac with wound vac nurse to see if pt is ready for skin grafting or will need further vac tx before skin grafting. Surgery on schedule for Thursday if wound looks good tomorrow. Will see tomorrow

## 2011-07-01 LAB — GLUCOSE, CAPILLARY
Glucose-Capillary: 128 mg/dL — ABNORMAL HIGH (ref 70–99)
Glucose-Capillary: 174 mg/dL — ABNORMAL HIGH (ref 70–99)
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
## 2011-07-01 NOTE — Progress Notes (Signed)
Pt seen/examined  Wound vac changed with wound care nurse Wound looks good, good healthy tissue  Will plan for OR tomorrow for wound debridement and skin grafting. Will continue to follow
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
## 2011-07-01 NOTE — Progress Notes (Signed)
This patient has been seen and I agree with the findings and treatment plan.  [name], [name], MD, FACS [phone] (pager) [phone] (direct pager) Trauma Surgeon  

## 2011-07-02 ENCOUNTER — Inpatient Hospital Stay (HOSPITAL_COMMUNITY): Payer: Medicare Other | Admitting: Anesthesiology

## 2011-07-02 ENCOUNTER — Encounter (HOSPITAL_COMMUNITY): Payer: Self-pay | Admitting: Anesthesiology

## 2011-07-02 ENCOUNTER — Encounter (HOSPITAL_COMMUNITY): Admission: EM | Disposition: A | Discharge status: Home / Self Care | Payer: Self-pay | Source: Home / Self Care

## 2011-07-02 HISTORY — PX: INCISION AND DRAINAGE OF WOUND: SHX1803

## 2011-07-02 LAB — GLUCOSE, CAPILLARY
Glucose-Capillary: 164 mg/dL — ABNORMAL HIGH (ref 70–99)
Glucose-Capillary: 289 mg/dL — ABNORMAL HIGH (ref 70–99)

## 2011-07-02 SURGERY — IRRIGATION AND DEBRIDEMENT WOUND
Anesthesia: General | Laterality: Left | Wound class: Clean Contaminated

## 2011-07-02 MED ORDER — CEFAZOLIN SODIUM 1-5 GM-% IV SOLN
INTRAVENOUS | Status: DC | PRN
  Administered 2011-07-02: 1 g via INTRAVENOUS

## 2011-07-02 MED ORDER — HYDROMORPHONE HCL PF 1 MG/ML IJ SOLN
1.0000 mg | INTRAMUSCULAR | Status: DC | PRN
  Administered 2011-07-02 – 2011-07-03 (×5): 1 mg via INTRAVENOUS
  Filled 2011-07-02 (×4): qty 1

## 2011-07-02 MED ORDER — HYDROMORPHONE HCL PF 1 MG/ML IJ SOLN
0.2500 mg | INTRAMUSCULAR | Status: DC | PRN
  Administered 2011-07-02: 0.5 mg via INTRAVENOUS

## 2011-07-02 MED ORDER — FENTANYL CITRATE 0.05 MG/ML IJ SOLN
INTRAMUSCULAR | Status: DC | PRN
  Administered 2011-07-02 (×5): 50 ug via INTRAVENOUS
  Administered 2011-07-02: 75 ug via INTRAVENOUS
  Administered 2011-07-02 (×5): 50 ug via INTRAVENOUS
  Administered 2011-07-02: 100 ug via INTRAVENOUS
  Administered 2011-07-02: 25 ug via INTRAVENOUS
  Administered 2011-07-02: 50 ug via INTRAVENOUS

## 2011-07-02 MED ORDER — MIDAZOLAM HCL 5 MG/5ML IJ SOLN
INTRAMUSCULAR | Status: DC | PRN
  Administered 2011-07-02: 2 mg via INTRAVENOUS

## 2011-07-02 MED ORDER — MINERAL OIL LIGHT 100 % EX OIL
TOPICAL_OIL | CUTANEOUS | Status: DC | PRN
  Administered 2011-07-02: 1 via TOPICAL

## 2011-07-02 MED ORDER — THROMBIN 20000 UNITS EX KIT
PACK | CUTANEOUS | Status: DC | PRN
  Administered 2011-07-02: 20000 [IU] via TOPICAL

## 2011-07-02 MED ORDER — SODIUM CHLORIDE 0.9 % IV SOLN
INTRAVENOUS | Status: DC | PRN
  Administered 2011-07-02 (×2): via INTRAVENOUS

## 2011-07-02 MED ORDER — SODIUM CHLORIDE 0.9 % IR SOLN
Status: DC | PRN
  Administered 2011-07-02: 1000 mL

## 2011-07-02 MED ORDER — LACTATED RINGERS IV SOLN: INTRAVENOUS | Status: DC | PRN

## 2011-07-02 MED ORDER — LACTATED RINGERS IV SOLN
INTRAVENOUS | Status: DC | PRN
  Administered 2011-07-02: 15:00:00 via INTRAVENOUS

## 2011-07-02 MED ORDER — PROPOFOL 10 MG/ML IV BOLUS
INTRAVENOUS | Status: DC | PRN
  Administered 2011-07-02: 200 mg via INTRAVENOUS

## 2011-07-02 MED ORDER — DROPERIDOL 2.5 MG/ML IJ SOLN: 0.6250 mg | INTRAMUSCULAR | Status: DC | PRN

## 2011-07-02 SURGICAL SUPPLY — 59 items
BANDAGE ELASTIC 3 VELCRO ST LF (GAUZE/BANDAGES/DRESSINGS) ×1 IMPLANT
BANDAGE ELASTIC 4 VELCRO ST LF (GAUZE/BANDAGES/DRESSINGS) ×1 IMPLANT
BANDAGE GAUZE ELAST BULKY 4 IN (GAUZE/BANDAGES/DRESSINGS) ×1 IMPLANT
BLADE DERMATOME II (BLADE) ×1 IMPLANT
BLADE SURG 15 STRL LF DISP TIS (BLADE) IMPLANT
BLADE SURG 15 STRL SS (BLADE) ×2
BNDG CMPR 9X4 STRL LF SNTH (GAUZE/BANDAGES/DRESSINGS) ×1
BNDG COHESIVE 1X5 TAN STRL LF (GAUZE/BANDAGES/DRESSINGS) ×1 IMPLANT
BNDG ESMARK 4X9 LF (GAUZE/BANDAGES/DRESSINGS) ×1 IMPLANT
CLOTH BEACON ORANGE TIMEOUT ST (SAFETY) ×1 IMPLANT
CORDS BIPOLAR (ELECTRODE) ×1 IMPLANT
COVER SURGICAL LIGHT HANDLE (MISCELLANEOUS) ×2 IMPLANT
CUFF TOURNIQUET SINGLE 18IN (TOURNIQUET CUFF) IMPLANT
CUFF TOURNIQUET SINGLE 24IN (TOURNIQUET CUFF) IMPLANT
DERMACARRIERS 3 TO 1 (MISCELLANEOUS) ×1 IMPLANT
DERMACARRIERS GRAFT 1 TO 1.5 (DISPOSABLE) ×2
DRAPE INCISE IOBAN 66X45 STRL (DRAPES) ×1 IMPLANT
DRAPE SURG 17X23 STRL (DRAPES) ×5 IMPLANT
DRESSING MATRIX WOUND 4X5 (GAUZE/BANDAGES/DRESSINGS) IMPLANT
DRSG ADAPTIC 3X8 NADH LF (GAUZE/BANDAGES/DRESSINGS) ×2 IMPLANT
DRSG MATRIX WOUND 4X5 (GAUZE/BANDAGES/DRESSINGS) ×2
DRSG PAD ABDOMINAL 8X10 ST (GAUZE/BANDAGES/DRESSINGS) ×1 IMPLANT
DRSG VAC ATS MED SENSATRAC (GAUZE/BANDAGES/DRESSINGS) ×1 IMPLANT
ELECT REM PT RETURN 9FT ADLT (ELECTROSURGICAL)
ELECTRODE REM PT RTRN 9FT ADLT (ELECTROSURGICAL) IMPLANT
GAUZE XEROFORM 5X9 LF (GAUZE/BANDAGES/DRESSINGS) ×1 IMPLANT
GLOVE BIOGEL PI IND STRL 7.0 (GLOVE) IMPLANT
GLOVE BIOGEL PI IND STRL 8.5 (GLOVE) IMPLANT
GLOVE BIOGEL PI INDICATOR 7.0 (GLOVE) ×1
GLOVE BIOGEL PI INDICATOR 8.5 (GLOVE) ×2
GLOVE SURG ORTHO 8.0 STRL STRW (GLOVE) ×2 IMPLANT
GOWN PREVENTION PLUS XLARGE (GOWN DISPOSABLE) ×1 IMPLANT
GOWN STRL NON-REIN LRG LVL3 (GOWN DISPOSABLE) ×5 IMPLANT
GRAFT DERMACARRIERS 1 TO 1.5 (DISPOSABLE) IMPLANT
HANDPIECE INTERPULSE COAX TIP (DISPOSABLE)
KIT BASIN OR (CUSTOM PROCEDURE TRAY) ×1 IMPLANT
KIT ROOM TURNOVER OR (KITS) ×1 IMPLANT
MANIFOLD NEPTUNE II (INSTRUMENTS) ×1 IMPLANT
NS IRRIG 1000ML POUR BTL (IV SOLUTION) ×1 IMPLANT
PACK ORTHO EXTREMITY (CUSTOM PROCEDURE TRAY) ×1 IMPLANT
PAD ARMBOARD 7.5X6 YLW CONV (MISCELLANEOUS) ×1 IMPLANT
PAD CAST 4YDX4 CTTN HI CHSV (CAST SUPPLIES) IMPLANT
PADDING CAST COTTON 4X4 STRL (CAST SUPPLIES) ×2
SET HNDPC FAN SPRY TIP SCT (DISPOSABLE) IMPLANT
SOAP 2 % CHG 4 OZ (WOUND CARE) ×1 IMPLANT
SPONGE GAUZE 4X4 12PLY (GAUZE/BANDAGES/DRESSINGS) ×1 IMPLANT
SPONGE LAP 18X18 X RAY DECT (DISPOSABLE) ×1 IMPLANT
SPONGE LAP 4X18 X RAY DECT (DISPOSABLE) ×1 IMPLANT
STAPLER VISISTAT (STAPLE) ×2 IMPLANT
STAPLER VISISTAT 35W (STAPLE) ×1 IMPLANT
SUCTION FRAZIER TIP 10 FR DISP (SUCTIONS) ×1 IMPLANT
SUT CHROMIC 5 0 P 3 (SUTURE) ×2 IMPLANT
SUT MNCRL AB 4-0 PS2 18 (SUTURE) ×2 IMPLANT
TOWEL OR 17X24 6PK STRL BLUE (TOWEL DISPOSABLE) ×1 IMPLANT
TOWEL OR 17X26 10 PK STRL BLUE (TOWEL DISPOSABLE) ×1 IMPLANT
TUBE CONNECTING 12X1/4 (SUCTIONS) ×1 IMPLANT
UNDERPAD 30X30 INCONTINENT (UNDERPADS AND DIAPERS) ×1 IMPLANT
WATER STERILE IRR 1000ML POUR (IV SOLUTION) ×1 IMPLANT
YANKAUER SUCT BULB TIP NO VENT (SUCTIONS) ×1 IMPLANT

## 2011-07-02 NOTE — Transfer of Care (Signed)
Immediate Anesthesia Transfer of Care Note  Patient: [name]  Procedure(s) Performed:  IRRIGATION AND DEBRIDEMENT WOUND - Irrigation and Debridement of left arm wound with  Skin Grafting from left thigh   Patient Location: PACU  Anesthesia Type: General  Level of Consciousness: awake, alert  and oriented  Airway & Oxygen Therapy: Patient Spontanous Breathing and Patient connected to nasal cannula oxygen  Post-op Assessment: Report given to PACU RN  Post vital signs: stable  Complications: No apparent anesthesia complications

## 2011-07-02 NOTE — Anesthesia Preprocedure Evaluation (Addendum)
Anesthesia Evaluation  Patient identified by MRN, date of birth, ID band Patient awake    Reviewed: Allergy & Precautions, H&P , NPO status , Patient's Chart, lab work & pertinent test results, reviewed documented beta blocker date and time   Airway Mallampati: II TM Distance: >3 FB Neck ROM: Full    Dental  (+) Dental Advisory Given   Pulmonary Current Smoker,  clear to auscultation  Pulmonary exam normal       Cardiovascular hypertension, Pt. on medications Regular Normal- Systolic murmurs    Neuro/Psych  Neuromuscular disease    GI/Hepatic (+)     substance abuse  alcohol use and cocaine use,   Endo/Other  Diabetes mellitus-, Poorly Controlled, Type 2, Oral Hypoglycemic Agents and Insulin Dependent  Renal/GU      Musculoskeletal   Abdominal   Peds  Hematology   Anesthesia Other Findings   Reproductive/Obstetrics                       Anesthesia Physical Anesthesia Plan  ASA: II  Anesthesia Plan: General   Post-op Pain Management:    Induction: Intravenous  Airway Management Planned: LMA  Additional Equipment:   Intra-op Plan:   Post-operative Plan:   Informed Consent: I have reviewed the patients History and Physical, chart, labs and discussed the procedure including the risks, benefits and alternatives for the proposed anesthesia with the patient or authorized representative who has indicated his/her understanding and acceptance.     Plan Discussed with: CRNA and Surgeon  Anesthesia Plan Comments:         Anesthesia Quick Evaluation

## 2011-07-02 NOTE — Progress Notes (Signed)
Pt seen examined To OR today for debridement and possible skin grafting May need to used biomatrix and come back to skin graft next week. Discussed this with patient.  Patient voiced understanding and reason for surgery Consent signed [id number]  [name]

## 2011-07-02 NOTE — Anesthesia Postprocedure Evaluation (Signed)
  Anesthesia Post-op Note  Patient: [name]  Procedure(s) Performed:  IRRIGATION AND DEBRIDEMENT WOUND - Irrigation and Debridement of left arm wound with  Skin Grafting from left thigh   Patient Location: PACU  Anesthesia Type: General  Level of Consciousness: awake, alert  and oriented  Airway and Oxygen Therapy: Patient Spontanous Breathing  Post-op Pain: none  Post-op Assessment: Post-op Vital signs reviewed, Patient's Cardiovascular Status Stable, Respiratory Function Stable, Patent Airway, No signs of Nausea or vomiting and Pain level controlled  Post-op Vital Signs: stable  Complications: No apparent anesthesia complications

## 2011-07-02 NOTE — Progress Notes (Signed)
OK TO D/C TO HOME WILL NEED HOME WOUND VAC, WILL NOT NEED TO BE CHANGED WILL NEED TO SEE IN OFFICE IN 10 DAYS KEEP VAC AT 100MMHG CONTINOUS/LOW  CONTINUE WITH SPLINT AND VAC AT ALL TIMES

## 2011-07-02 NOTE — Consult Note (Signed)
Wound care follow-up:  Vac reading blockage throughout the night and alarming.  Bedside nurses have applied several layers of drape.  @ separate areas of sponge have drifted apart.  Cut opening and applied new area of bridge sponge and new track pad.  Good seal and suction at 125 cm with mod pink drainage.  Plans to go to the OR today for closure. Will not plan to follow further unless re-consulted.  [address], [place], MSN, [place]

## 2011-07-02 NOTE — Anesthesia Procedure Notes (Addendum)
Performed by: Marinda Elk   Procedure Name: LMA Insertion Date/Time: 07/02/2011 3:26 PM Performed by: Marinda Elk Pre-anesthesia Checklist: Patient identified, Timeout performed, Emergency Drugs available, Suction available and Patient being monitored Patient Re-evaluated:Patient Re-evaluated prior to inductionOxygen Delivery Method: Circle System Utilized Preoxygenation: Pre-oxygenation with 100% oxygen Intubation Type: IV induction Ventilation: Mask ventilation without difficulty LMA: LMA inserted LMA Size: 5.0 Number of attempts: 1 Placement Confirmation: positive ETCO2 and breath sounds checked- equal and bilateral Tube secured with: Tape Dental Injury: Teeth and Oropharynx as per pre-operative assessment
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
## 2011-07-02 NOTE — Brief Op Note (Signed)
06/27/2011 - 07/02/2011  5:30 PM  PATIENT:  [name]  45 y.o. male  PRE-OPERATIVE DIAGNOSIS:  left arm wound  POST-OPERATIVE DIAGNOSIS:  left arm wound  PROCEDURE:  Procedure(s): IRRIGATION AND DEBRIDEMENT WOUND AND STSG TO LEFT ARM AND FOREARM LEFT ARM 4X4 CM AREA LEFT FOREARM 8X12CM AREA  SURGEON:  Surgeon(s): Linna Hoff  PHYSICIAN ASSISTANT: NONE  ASSISTANTS: none   ANESTHESIA:   general  EBL:  Total I/O In: 1800 [I.V.:1800] Out: 610 [Urine:600; Blood:10]  BLOOD ADMINISTERED:none  DRAINS: none   LOCAL MEDICATIONS USED:  NONE  SPECIMEN:  No Specimen  DISPOSITION OF SPECIMEN:  N/A  COUNTS:  YES  TOURNIQUET:  * No tourniquets in log *  DICTATION: .329191  PLAN OF CARE: Admit to inpatient   PATIENT DISPOSITION:  PACU - hemodynamically stable.   Delay start of Pharmacological VTE agent (>24hrs) due to surgical blood loss or risk of bleeding:  {YES/NO/NOT APPLICABLE:20182

## 2011-07-02 NOTE — Progress Notes (Signed)
This patient has been seen and I agree with the findings and treatment plan.  Wing Gfeller O. Lacora Folmer, [name], MD, FACS [phone] (pager) [phone] (direct pager) Trauma Surgeon  

## 2011-07-03 LAB — GLUCOSE, CAPILLARY
Glucose-Capillary: 185 mg/dL — ABNORMAL HIGH (ref 70–99)
Glucose-Capillary: 211 mg/dL — ABNORMAL HIGH (ref 70–99)
Glucose-Capillary: 226 mg/dL — ABNORMAL HIGH (ref 70–99)

## 2011-07-03 MED ORDER — NAPROXEN 500 MG PO TABS: 500.0000 mg | ORAL_TABLET | Freq: Two times a day (BID) | ORAL | Status: AC

## 2011-07-03 MED ORDER — METHOCARBAMOL 500 MG PO TABS: 500.0000 mg | ORAL_TABLET | Freq: Four times a day (QID) | ORAL | Status: AC | PRN

## 2011-07-03 MED ORDER — SIMETHICONE 80 MG PO CHEW
80.0000 mg | CHEWABLE_TABLET | Freq: Four times a day (QID) | ORAL | Status: DC | PRN
  Administered 2011-07-03: 80 mg via ORAL
  Filled 2011-07-03 (×2): qty 1

## 2011-07-03 MED ORDER — TRAMADOL HCL 50 MG PO TABS: 100.0000 mg | ORAL_TABLET | Freq: Four times a day (QID) | ORAL | Status: AC

## 2011-07-03 MED ORDER — TRAMADOL HCL 50 MG PO TABS
100.0000 mg | ORAL_TABLET | Freq: Four times a day (QID) | ORAL | Status: DC
  Administered 2011-07-03: 100 mg via ORAL
  Filled 2011-07-03: qty 2

## 2011-07-03 MED ORDER — OXYCODONE-ACETAMINOPHEN 10-325 MG PO TABS: 1.0000 | ORAL_TABLET | Freq: Four times a day (QID) | ORAL | Status: AC | PRN
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
## 2011-07-03 NOTE — Progress Notes (Signed)
This patient has been seen and I agree with the findings and treatment plan.  Tarrence Enck O. Isha Seefeld, [name], MD, FACS [phone] (pager) [phone] (direct pager) Trauma Surgeon  

## 2011-07-03 NOTE — Discharge Summary (Signed)
This patient has been seen and I agree with the findings and treatment plan.  Kage Willmann O. Geraldo Haris, [name], MD, FACS [phone] (pager) [phone] (direct pager) Trauma Surgeon  
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
## 2011-07-03 NOTE — Progress Notes (Signed)
Inpatient Diabetes Program Recommendations  AACE/ADA: New Consensus Statement on Inpatient Glycemic Control (2009)  Target Ranges:  Prepandial:   less than 140 mg/dL      Peak postprandial:   less than 180 mg/dL (1-2 hours)      Critically ill patients:  140 - 180 mg/dL   Reason for Visit: CBGs 213, 289, 185, 226, 195  Inpatient Diabetes Program Recommendations Insulin - Basal: increase Lantus to 24 units

## 2011-07-14 ENCOUNTER — Encounter (HOSPITAL_COMMUNITY): Payer: Self-pay | Admitting: Orthopedic Surgery

## 2011-08-07 DIAGNOSIS — S51809A Unspecified open wound of unspecified forearm, initial encounter: Secondary | ICD-10-CM | POA: Diagnosis not present

## 2011-08-14 DIAGNOSIS — S51809A Unspecified open wound of unspecified forearm, initial encounter: Secondary | ICD-10-CM | POA: Diagnosis not present

## 2011-08-24 DIAGNOSIS — S51809A Unspecified open wound of unspecified forearm, initial encounter: Secondary | ICD-10-CM | POA: Diagnosis not present

## 2011-09-04 DIAGNOSIS — S51809A Unspecified open wound of unspecified forearm, initial encounter: Secondary | ICD-10-CM | POA: Diagnosis not present

## 2011-09-16 DIAGNOSIS — I10 Essential (primary) hypertension: Secondary | ICD-10-CM | POA: Diagnosis not present

## 2011-09-28 DIAGNOSIS — S51809A Unspecified open wound of unspecified forearm, initial encounter: Secondary | ICD-10-CM | POA: Diagnosis not present

## 2011-10-12 DIAGNOSIS — S51809A Unspecified open wound of unspecified forearm, initial encounter: Secondary | ICD-10-CM | POA: Diagnosis not present

## 2011-10-13 DIAGNOSIS — S51809A Unspecified open wound of unspecified forearm, initial encounter: Secondary | ICD-10-CM | POA: Diagnosis not present

## 2011-12-17 DIAGNOSIS — E119 Type 2 diabetes mellitus without complications: Secondary | ICD-10-CM | POA: Diagnosis not present

## 2011-12-17 DIAGNOSIS — I10 Essential (primary) hypertension: Secondary | ICD-10-CM | POA: Diagnosis not present

## 2012-03-31 DIAGNOSIS — E119 Type 2 diabetes mellitus without complications: Secondary | ICD-10-CM | POA: Diagnosis not present

## 2012-03-31 DIAGNOSIS — E785 Hyperlipidemia, unspecified: Secondary | ICD-10-CM | POA: Diagnosis not present

## 2012-03-31 DIAGNOSIS — I10 Essential (primary) hypertension: Secondary | ICD-10-CM | POA: Diagnosis not present

## 2012-03-31 DIAGNOSIS — M1990 Unspecified osteoarthritis, unspecified site: Secondary | ICD-10-CM | POA: Diagnosis not present

## 2012-08-22 DIAGNOSIS — J209 Acute bronchitis, unspecified: Secondary | ICD-10-CM | POA: Diagnosis not present

## 2012-08-22 DIAGNOSIS — E119 Type 2 diabetes mellitus without complications: Secondary | ICD-10-CM | POA: Diagnosis not present

## 2012-08-22 DIAGNOSIS — E785 Hyperlipidemia, unspecified: Secondary | ICD-10-CM | POA: Diagnosis not present

## 2012-08-22 DIAGNOSIS — I10 Essential (primary) hypertension: Secondary | ICD-10-CM | POA: Diagnosis not present

## 2013-01-24 DIAGNOSIS — I10 Essential (primary) hypertension: Secondary | ICD-10-CM | POA: Diagnosis not present

## 2013-01-24 DIAGNOSIS — E119 Type 2 diabetes mellitus without complications: Secondary | ICD-10-CM | POA: Diagnosis not present

## 2013-02-14 ENCOUNTER — Encounter (HOSPITAL_COMMUNITY): Payer: Self-pay | Admitting: *Deleted

## 2013-02-14 ENCOUNTER — Emergency Department (INDEPENDENT_AMBULATORY_CARE_PROVIDER_SITE_OTHER): Payer: Medicare Other

## 2013-02-14 ENCOUNTER — Emergency Department (INDEPENDENT_AMBULATORY_CARE_PROVIDER_SITE_OTHER)
Admission: EM | Admit: 2013-02-14 | Discharge: 2013-02-14 | Disposition: A | Discharge status: Home / Self Care | Payer: Medicare Other | Source: Home / Self Care | Attending: Family Medicine | Admitting: Family Medicine

## 2013-02-14 DIAGNOSIS — S20219A Contusion of unspecified front wall of thorax, initial encounter: Secondary | ICD-10-CM

## 2013-02-14 DIAGNOSIS — R079 Chest pain, unspecified: Secondary | ICD-10-CM | POA: Diagnosis not present

## 2013-02-14 DIAGNOSIS — S298XXA Other specified injuries of thorax, initial encounter: Secondary | ICD-10-CM | POA: Diagnosis not present

## 2013-02-14 DIAGNOSIS — S20212A Contusion of left front wall of thorax, initial encounter: Secondary | ICD-10-CM

## 2013-02-14 MED ORDER — DICLOFENAC POTASSIUM 50 MG PO TABS: 50.0000 mg | ORAL_TABLET | Freq: Three times a day (TID) | ORAL | Status: DC
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
## 2013-08-05 ENCOUNTER — Emergency Department (INDEPENDENT_AMBULATORY_CARE_PROVIDER_SITE_OTHER)
Admission: EM | Admit: 2013-08-05 | Discharge: 2013-08-05 | Disposition: A | Discharge status: Home / Self Care | Payer: PRIVATE HEALTH INSURANCE | Source: Home / Self Care | Attending: Family Medicine | Admitting: Family Medicine

## 2013-08-05 ENCOUNTER — Encounter (HOSPITAL_COMMUNITY): Payer: Self-pay | Admitting: Emergency Medicine

## 2013-08-05 ENCOUNTER — Emergency Department (INDEPENDENT_AMBULATORY_CARE_PROVIDER_SITE_OTHER): Payer: PRIVATE HEALTH INSURANCE

## 2013-08-05 DIAGNOSIS — S161XXA Strain of muscle, fascia and tendon at neck level, initial encounter: Secondary | ICD-10-CM

## 2013-08-05 DIAGNOSIS — S0993XA Unspecified injury of face, initial encounter: Secondary | ICD-10-CM | POA: Diagnosis not present

## 2013-08-05 DIAGNOSIS — M542 Cervicalgia: Secondary | ICD-10-CM | POA: Diagnosis not present

## 2013-08-05 DIAGNOSIS — S139XXA Sprain of joints and ligaments of unspecified parts of neck, initial encounter: Secondary | ICD-10-CM

## 2013-08-05 MED ORDER — CYCLOBENZAPRINE HCL 10 MG PO TABS: 10.0000 mg | ORAL_TABLET | Freq: Every evening | ORAL | Status: DC | PRN

## 2013-08-05 MED ORDER — TRAMADOL HCL 50 MG PO TABS: 50.0000 mg | ORAL_TABLET | Freq: Four times a day (QID) | ORAL | Status: DC | PRN
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
## 2013-09-04 ENCOUNTER — Ambulatory Visit: Payer: Self-pay | Admitting: Family Medicine

## 2013-09-22 ENCOUNTER — Ambulatory Visit: Payer: Medicare Other | Admitting: Family Medicine

## 2013-10-12 DIAGNOSIS — E1110 Type 2 diabetes mellitus with ketoacidosis without coma: Secondary | ICD-10-CM | POA: Diagnosis not present

## 2013-10-12 DIAGNOSIS — I10 Essential (primary) hypertension: Secondary | ICD-10-CM | POA: Diagnosis not present

## 2013-10-12 DIAGNOSIS — E119 Type 2 diabetes mellitus without complications: Secondary | ICD-10-CM | POA: Diagnosis not present

## 2013-11-16 DIAGNOSIS — E119 Type 2 diabetes mellitus without complications: Secondary | ICD-10-CM | POA: Diagnosis not present

## 2013-11-16 DIAGNOSIS — M542 Cervicalgia: Secondary | ICD-10-CM | POA: Diagnosis not present

## 2013-11-23 ENCOUNTER — Ambulatory Visit: Payer: Medicare Other | Attending: Family Medicine

## 2013-11-23 DIAGNOSIS — M2550 Pain in unspecified joint: Secondary | ICD-10-CM | POA: Insufficient documentation

## 2013-11-23 DIAGNOSIS — M542 Cervicalgia: Secondary | ICD-10-CM | POA: Diagnosis not present

## 2013-11-23 DIAGNOSIS — IMO0001 Reserved for inherently not codable concepts without codable children: Secondary | ICD-10-CM | POA: Diagnosis not present

## 2013-12-04 ENCOUNTER — Encounter: Payer: Medicare Other | Admitting: Rehabilitation

## 2013-12-06 ENCOUNTER — Encounter: Payer: Medicare Other | Admitting: Rehabilitation

## 2013-12-20 ENCOUNTER — Encounter: Payer: Medicare Other | Admitting: Rehabilitation

## 2013-12-26 ENCOUNTER — Encounter: Payer: Medicare Other | Admitting: Rehabilitation

## 2013-12-28 ENCOUNTER — Encounter: Payer: Medicare Other | Admitting: Rehabilitation

## 2014-04-26 DIAGNOSIS — E119 Type 2 diabetes mellitus without complications: Secondary | ICD-10-CM | POA: Diagnosis not present

## 2014-04-26 DIAGNOSIS — Z23 Encounter for immunization: Secondary | ICD-10-CM | POA: Diagnosis not present

## 2014-04-26 DIAGNOSIS — I10 Essential (primary) hypertension: Secondary | ICD-10-CM | POA: Diagnosis not present

## 2014-04-26 DIAGNOSIS — E785 Hyperlipidemia, unspecified: Secondary | ICD-10-CM | POA: Diagnosis not present

## 2014-04-26 DIAGNOSIS — R079 Chest pain, unspecified: Secondary | ICD-10-CM | POA: Diagnosis not present

## 2014-05-15 DIAGNOSIS — M79672 Pain in left foot: Secondary | ICD-10-CM | POA: Diagnosis not present

## 2014-05-15 DIAGNOSIS — E119 Type 2 diabetes mellitus without complications: Secondary | ICD-10-CM | POA: Diagnosis not present

## 2014-05-15 DIAGNOSIS — I10 Essential (primary) hypertension: Secondary | ICD-10-CM | POA: Diagnosis not present

## 2014-05-30 DIAGNOSIS — E1140 Type 2 diabetes mellitus with diabetic neuropathy, unspecified: Secondary | ICD-10-CM | POA: Diagnosis not present

## 2014-05-30 DIAGNOSIS — I10 Essential (primary) hypertension: Secondary | ICD-10-CM | POA: Diagnosis not present

## 2014-05-30 DIAGNOSIS — F17209 Nicotine dependence, unspecified, with unspecified nicotine-induced disorders: Secondary | ICD-10-CM | POA: Diagnosis not present

## 2014-05-30 DIAGNOSIS — R0789 Other chest pain: Secondary | ICD-10-CM | POA: Diagnosis not present

## 2014-05-31 ENCOUNTER — Ambulatory Visit (INDEPENDENT_AMBULATORY_CARE_PROVIDER_SITE_OTHER): Payer: Medicare Other | Admitting: Podiatry

## 2014-05-31 ENCOUNTER — Ambulatory Visit (INDEPENDENT_AMBULATORY_CARE_PROVIDER_SITE_OTHER): Payer: Medicare Other

## 2014-05-31 ENCOUNTER — Encounter: Payer: Self-pay | Admitting: Podiatry

## 2014-05-31 VITALS — BP 117/75 | HR 87 | Resp 16

## 2014-05-31 DIAGNOSIS — M779 Enthesopathy, unspecified: Secondary | ICD-10-CM

## 2014-05-31 DIAGNOSIS — E1151 Type 2 diabetes mellitus with diabetic peripheral angiopathy without gangrene: Secondary | ICD-10-CM | POA: Diagnosis not present

## 2014-05-31 DIAGNOSIS — Q828 Other specified congenital malformations of skin: Secondary | ICD-10-CM

## 2014-05-31 NOTE — Progress Notes (Signed)
   Subjective:    Patient ID: [name], male    DOB: 1965-10-30, 48 y.o.   MRN: [id number]  HPI Comments: "I have pain in the bottom"  Patient c/o burning plantar forefoot bilateral for several months. There are some calluses. Pain with a lot of walking. No home treatment.  Foot Pain Associated symptoms include chest pain and headaches.      Review of Systems  HENT: Positive for sneezing.   Cardiovascular: Positive for chest pain.  Musculoskeletal: Positive for gait problem.  Neurological: Positive for headaches.  All other systems reviewed and are negative.      Objective:   Physical Exam        Assessment & Plan:

## 2014-06-04 NOTE — Progress Notes (Signed)
Subjective:     Patient ID: [name], male   DOB: 05-14-1966, 48 y.o.   MRN: [id number]  HPIpatient presents stating I been having a lot of pain on the bottom of my both feet with callus formation and trouble with been on any kinds of cement floors or walking barefoot   Review of Systems  All other systems reviewed and are negative.      Objective:   Physical Exam  Constitutional: He is oriented to person, place, and time.  Cardiovascular: Intact distal pulses.   Musculoskeletal: Normal range of motion.  Neurological: He is oriented to person, place, and time.  Skin: Skin is warm.  Nursing note and vitals reviewed. neurovascular status intact with muscle strength adequate and range of motion of the subtalar and midtarsal joint within normal limits. Patient is noted to have good digital perfusion is well oriented 3 and has no equinus condition. Severe calluses sub-third metatarsal of both feet sub-fifth that are very painful when pressed and nucleated with the left foot being worse than the right     Assessment:     Inflammatory callus formation with spur at structure as part of the issue and skin structure    Plan:     H&P performed and condition reviewed. I went ahead reviewed x-rays and today I did deep debridement of lesions and explained that I'm hoping this will last for a while but unfortunately reoccurrence will, again patient will be seen back to recheck

## 2014-06-14 DIAGNOSIS — E1165 Type 2 diabetes mellitus with hyperglycemia: Secondary | ICD-10-CM | POA: Diagnosis not present

## 2014-06-14 DIAGNOSIS — A084 Viral intestinal infection, unspecified: Secondary | ICD-10-CM | POA: Diagnosis not present

## 2014-12-25 DIAGNOSIS — E118 Type 2 diabetes mellitus with unspecified complications: Secondary | ICD-10-CM | POA: Diagnosis not present

## 2014-12-25 DIAGNOSIS — I10 Essential (primary) hypertension: Secondary | ICD-10-CM | POA: Diagnosis not present

## 2014-12-25 DIAGNOSIS — E785 Hyperlipidemia, unspecified: Secondary | ICD-10-CM | POA: Diagnosis not present

## 2014-12-26 DIAGNOSIS — E785 Hyperlipidemia, unspecified: Secondary | ICD-10-CM | POA: Diagnosis not present

## 2014-12-26 DIAGNOSIS — I10 Essential (primary) hypertension: Secondary | ICD-10-CM | POA: Diagnosis not present

## 2014-12-26 DIAGNOSIS — E118 Type 2 diabetes mellitus with unspecified complications: Secondary | ICD-10-CM | POA: Diagnosis not present

## 2015-02-19 DIAGNOSIS — I10 Essential (primary) hypertension: Secondary | ICD-10-CM | POA: Diagnosis not present

## 2015-02-19 DIAGNOSIS — E1165 Type 2 diabetes mellitus with hyperglycemia: Secondary | ICD-10-CM | POA: Diagnosis not present

## 2016-02-24 DIAGNOSIS — Z6825 Body mass index (BMI) 25.0-25.9, adult: Secondary | ICD-10-CM | POA: Diagnosis not present

## 2016-02-24 DIAGNOSIS — E785 Hyperlipidemia, unspecified: Secondary | ICD-10-CM | POA: Diagnosis not present

## 2016-02-24 DIAGNOSIS — I10 Essential (primary) hypertension: Secondary | ICD-10-CM | POA: Diagnosis not present

## 2016-02-24 DIAGNOSIS — E1142 Type 2 diabetes mellitus with diabetic polyneuropathy: Secondary | ICD-10-CM | POA: Diagnosis not present

## 2016-08-30 DIAGNOSIS — Z23 Encounter for immunization: Secondary | ICD-10-CM | POA: Diagnosis not present

## 2016-11-20 ENCOUNTER — Ambulatory Visit: Payer: Self-pay | Admitting: Family Medicine

## 2016-12-01 ENCOUNTER — Other Ambulatory Visit: Payer: Self-pay | Admitting: Family Medicine

## 2016-12-01 ENCOUNTER — Ambulatory Visit (INDEPENDENT_AMBULATORY_CARE_PROVIDER_SITE_OTHER): Payer: Medicare Other | Admitting: Family Medicine

## 2016-12-01 ENCOUNTER — Encounter: Payer: Self-pay | Admitting: Family Medicine

## 2016-12-01 VITALS — BP 146/90 | HR 83 | Temp 98.5°F | Resp 16 | Ht 67.0 in | Wt 187.0 lb

## 2016-12-01 DIAGNOSIS — Z114 Encounter for screening for human immunodeficiency virus [HIV]: Secondary | ICD-10-CM | POA: Diagnosis not present

## 2016-12-01 DIAGNOSIS — E118 Type 2 diabetes mellitus with unspecified complications: Secondary | ICD-10-CM

## 2016-12-01 DIAGNOSIS — Z794 Long term (current) use of insulin: Secondary | ICD-10-CM

## 2016-12-01 DIAGNOSIS — M25551 Pain in right hip: Secondary | ICD-10-CM | POA: Diagnosis not present

## 2016-12-01 DIAGNOSIS — I10 Essential (primary) hypertension: Secondary | ICD-10-CM

## 2016-12-01 DIAGNOSIS — G629 Polyneuropathy, unspecified: Secondary | ICD-10-CM | POA: Diagnosis not present

## 2016-12-01 DIAGNOSIS — E785 Hyperlipidemia, unspecified: Secondary | ICD-10-CM | POA: Diagnosis not present

## 2016-12-01 DIAGNOSIS — F17210 Nicotine dependence, cigarettes, uncomplicated: Secondary | ICD-10-CM

## 2016-12-01 DIAGNOSIS — Z1211 Encounter for screening for malignant neoplasm of colon: Secondary | ICD-10-CM

## 2016-12-01 DIAGNOSIS — F17200 Nicotine dependence, unspecified, uncomplicated: Secondary | ICD-10-CM | POA: Insufficient documentation

## 2016-12-01 DIAGNOSIS — Z0000 Encounter for general adult medical examination without abnormal findings: Secondary | ICD-10-CM | POA: Diagnosis not present

## 2016-12-01 DIAGNOSIS — Z1159 Encounter for screening for other viral diseases: Secondary | ICD-10-CM | POA: Diagnosis not present

## 2016-12-01 LAB — POCT URINALYSIS DIP (DEVICE)
BILIRUBIN URINE: NEGATIVE
Glucose, UA: NEGATIVE mg/dL
Hgb urine dipstick: NEGATIVE
KETONES UR: NEGATIVE mg/dL
LEUKOCYTES UA: NEGATIVE
Nitrite: NEGATIVE
PH: 5.5 (ref 5.0–8.0)
Protein, ur: NEGATIVE mg/dL
Specific Gravity, Urine: 1.025 (ref 1.005–1.030)
Urobilinogen, UA: 1 mg/dL (ref 0.0–1.0)

## 2016-12-01 LAB — CBC WITH DIFFERENTIAL/PLATELET
Basophils Absolute: 0 cells/uL (ref 0–200)
Basophils Relative: 0 %
Eosinophils Absolute: 61 cells/uL (ref 15–500)
Eosinophils Relative: 1 %
HEMATOCRIT: 44 % (ref 38.5–50.0)
HEMOGLOBIN: 14.3 g/dL (ref 13.2–17.1)
LYMPHS ABS: 2745 {cells}/uL (ref 850–3900)
Lymphocytes Relative: 45 %
MCH: 28.3 pg (ref 27.0–33.0)
MCHC: 32.5 g/dL (ref 32.0–36.0)
MCV: 87 fL (ref 80.0–100.0)
MONO ABS: 549 {cells}/uL (ref 200–950)
MPV: 8.7 fL (ref 7.5–12.5)
Monocytes Relative: 9 %
NEUTROS ABS: 2745 {cells}/uL (ref 1500–7800)
Neutrophils Relative %: 45 %
Platelets: 256 10*3/uL (ref 140–400)
RBC: 5.06 MIL/uL (ref 4.20–5.80)
RDW: 14.4 % (ref 11.0–15.0)
WBC: 6.1 10*3/uL (ref 3.8–10.8)

## 2016-12-01 LAB — COMPLETE METABOLIC PANEL WITH GFR
ALK PHOS: 52 U/L (ref 40–115)
ALT: 71 U/L — ABNORMAL HIGH (ref 9–46)
AST: 56 U/L — ABNORMAL HIGH (ref 10–35)
Albumin: 3.7 g/dL (ref 3.6–5.1)
BUN: 15 mg/dL (ref 7–25)
CALCIUM: 9.4 mg/dL (ref 8.6–10.3)
CHLORIDE: 107 mmol/L (ref 98–110)
CO2: 26 mmol/L (ref 20–31)
Creat: 1.31 mg/dL (ref 0.70–1.33)
GFR, EST AFRICAN AMERICAN: 73 mL/min (ref >=60)
GFR, Est Non African American: 63 mL/min (ref >=60)
Glucose, Bld: 165 mg/dL — ABNORMAL HIGH (ref 65–99)
POTASSIUM: 4.4 mmol/L (ref 3.5–5.3)
Sodium: 143 mmol/L (ref 135–146)
Total Bilirubin: 0.4 mg/dL (ref 0.2–1.2)
Total Protein: 6.7 g/dL (ref 6.1–8.1)

## 2016-12-01 LAB — POCT GLYCOSYLATED HEMOGLOBIN (HGB A1C): HEMOGLOBIN A1C: 8

## 2016-12-01 LAB — GLUCOSE, CAPILLARY: Glucose-Capillary: 216 mg/dL — ABNORMAL HIGH (ref 65–99)

## 2016-12-01 MED ORDER — GABAPENTIN 300 MG PO CAPS: 300.0000 mg | ORAL_CAPSULE | Freq: Three times a day (TID) | ORAL | 1 refills | Status: DC

## 2016-12-01 MED ORDER — GLUCOSE BLOOD VI STRP: ORAL_STRIP | 12 refills | Status: DC

## 2016-12-01 MED ORDER — PRAVASTATIN SODIUM 80 MG PO TABS: 80.0000 mg | ORAL_TABLET | Freq: Every day | ORAL | 1 refills | Status: DC

## 2016-12-01 MED ORDER — INSULIN GLARGINE 100 UNIT/ML ~~LOC~~ SOLN: 30.0000 [IU] | Freq: Every day | SUBCUTANEOUS | 11 refills | Status: DC

## 2016-12-01 MED ORDER — HYDROCHLOROTHIAZIDE 25 MG PO TABS: 25.0000 mg | ORAL_TABLET | Freq: Every day | ORAL | 1 refills | Status: DC

## 2016-12-01 MED ORDER — METFORMIN HCL 1000 MG PO TABS: 1000.0000 mg | ORAL_TABLET | Freq: Two times a day (BID) | ORAL | 1 refills | Status: DC

## 2016-12-01 MED ORDER — GLIPIZIDE 10 MG PO TABS: 10.0000 mg | ORAL_TABLET | Freq: Two times a day (BID) | ORAL | 1 refills | Status: DC

## 2016-12-01 MED ORDER — LISINOPRIL 20 MG PO TABS: 20.0000 mg | ORAL_TABLET | Freq: Every day | ORAL | 1 refills | Status: DC

## 2016-12-01 MED ORDER — TRAMADOL HCL 50 MG PO TABS: 50.0000 mg | ORAL_TABLET | Freq: Four times a day (QID) | ORAL | 0 refills | Status: DC | PRN
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
## 2016-12-02 ENCOUNTER — Other Ambulatory Visit: Payer: Self-pay | Admitting: Family Medicine

## 2016-12-02 DIAGNOSIS — Z1159 Encounter for screening for other viral diseases: Secondary | ICD-10-CM

## 2016-12-02 DIAGNOSIS — R748 Abnormal levels of other serum enzymes: Secondary | ICD-10-CM

## 2016-12-02 LAB — HIV ANTIBODY (ROUTINE TESTING W REFLEX): HIV: NONREACTIVE

## 2016-12-04 LAB — HEPATITIS C ANTIBODY: HCV AB: REACTIVE — AB

## 2016-12-05 LAB — HEPATITIS C RNA QUANTITATIVE
HCV Quantitative Log: 7.18 Log IU/mL — ABNORMAL HIGH
HCV Quantitative: 15000000 IU/mL — ABNORMAL HIGH

## 2017-01-05 ENCOUNTER — Encounter: Payer: Self-pay | Admitting: Family Medicine

## 2017-03-04 ENCOUNTER — Ambulatory Visit: Payer: Self-pay | Admitting: Family Medicine

## 2017-04-12 ENCOUNTER — Telehealth: Payer: Self-pay

## 2017-04-12 ENCOUNTER — Other Ambulatory Visit: Payer: Self-pay | Admitting: Family Medicine

## 2017-04-12 NOTE — Telephone Encounter (Signed)
I called and spoke with patient. He states he takes his lantus 30 units twice daily instead of what we have in the computer as once daily. He no-showed an appointment on 03/04/2017. Can this be changed or does he have to be seen first? He has scheduled an appointment for October 8th,2018. Please advise. Thanks!

## 2017-04-12 NOTE — Telephone Encounter (Signed)
[name] will need to continue Lantus 30 units HS. Will need to seen him in office on Friday 04/16/2017.   Thanks

## 2017-04-13 NOTE — Telephone Encounter (Signed)
I called and spoke with patient, advised him that he needs to be taking 30 units every night and we need to see him asap preferably on Friday 04/16/2017. Patient advised me that he has always taken the lantus 30 units twice daily and will continue to do so until he comes in. He also stated that he can NOT come in for an appointment before the appointment in October. I encouraged him if there is anyway possible to please come in asap so we can follow up on this. He says if there is any changes he will call to scheduled.

## 2017-04-26 ENCOUNTER — Other Ambulatory Visit: Payer: Self-pay

## 2017-04-26 NOTE — Patient Outreach (Signed)
[facility]) Care Management  04/26/2017  Canaan Holzer Lockridge 1966-05-01 [id number]   Medication Adherence call to Mr. Clinton Gallant the reason for this call is because  Mr. Koppel  Is showing past due under [facility] Ins.on Lisinopril 20 mg spoke to patient he said he already pick up this medication from [facility] patient  wont be due in 3 more month from now.   [facility] Management Direct Dial [phone]  Fax [phone] [email or web address]

## 2017-05-10 ENCOUNTER — Ambulatory Visit: Payer: Self-pay | Admitting: Family Medicine

## 2017-05-19 ENCOUNTER — Ambulatory Visit: Payer: Self-pay | Admitting: Family Medicine

## 2017-06-16 ENCOUNTER — Ambulatory Visit: Payer: Self-pay | Admitting: Family Medicine

## 2017-06-17 ENCOUNTER — Ambulatory Visit (INDEPENDENT_AMBULATORY_CARE_PROVIDER_SITE_OTHER): Payer: Medicare Other | Admitting: Family Medicine

## 2017-06-17 ENCOUNTER — Encounter: Payer: Self-pay | Admitting: Family Medicine

## 2017-06-17 VITALS — BP 146/90 | HR 88 | Temp 98.3°F | Resp 16 | Ht 67.0 in | Wt 180.0 lb

## 2017-06-17 DIAGNOSIS — E785 Hyperlipidemia, unspecified: Secondary | ICD-10-CM | POA: Diagnosis not present

## 2017-06-17 DIAGNOSIS — Z794 Long term (current) use of insulin: Secondary | ICD-10-CM

## 2017-06-17 DIAGNOSIS — H6121 Impacted cerumen, right ear: Secondary | ICD-10-CM

## 2017-06-17 DIAGNOSIS — F17200 Nicotine dependence, unspecified, uncomplicated: Secondary | ICD-10-CM

## 2017-06-17 DIAGNOSIS — M25551 Pain in right hip: Secondary | ICD-10-CM

## 2017-06-17 DIAGNOSIS — Z23 Encounter for immunization: Secondary | ICD-10-CM | POA: Diagnosis not present

## 2017-06-17 DIAGNOSIS — I10 Essential (primary) hypertension: Secondary | ICD-10-CM | POA: Diagnosis not present

## 2017-06-17 DIAGNOSIS — E118 Type 2 diabetes mellitus with unspecified complications: Secondary | ICD-10-CM

## 2017-06-17 LAB — POCT URINALYSIS DIP (DEVICE)
BILIRUBIN URINE: NEGATIVE
Glucose, UA: NEGATIVE mg/dL
HGB URINE DIPSTICK: NEGATIVE
KETONES UR: NEGATIVE mg/dL
Leukocytes, UA: NEGATIVE
Nitrite: NEGATIVE
PH: 5.5 (ref 5.0–8.0)
PROTEIN: NEGATIVE mg/dL
Urobilinogen, UA: 1 mg/dL (ref 0.0–1.0)

## 2017-06-17 LAB — COMPLETE METABOLIC PANEL WITH GFR
AG RATIO: 1.4 (calc) (ref 1.0–2.5)
ALBUMIN MSPROF: 4 g/dL (ref 3.6–5.1)
ALKALINE PHOSPHATASE (APISO): 56 U/L (ref 40–115)
ALT: 68 U/L — ABNORMAL HIGH (ref 9–46)
AST: 51 U/L — AB (ref 10–35)
BUN / CREAT RATIO: 13 (calc) (ref 6–22)
BUN: 18 mg/dL (ref 7–25)
CO2: 28 mmol/L (ref 20–32)
Calcium: 9.5 mg/dL (ref 8.6–10.3)
Chloride: 105 mmol/L (ref 98–110)
Creat: 1.37 mg/dL — ABNORMAL HIGH (ref 0.70–1.33)
GFR, EST NON AFRICAN AMERICAN: 59 mL/min/{1.73_m2} — AB (ref >=60)
GFR, Est African American: 69 mL/min/{1.73_m2} (ref >=60)
GLOBULIN: 2.9 g/dL (ref 1.9–3.7)
Glucose, Bld: 243 mg/dL — ABNORMAL HIGH (ref 65–99)
POTASSIUM: 4.3 mmol/L (ref 3.5–5.3)
SODIUM: 140 mmol/L (ref 135–146)
Total Bilirubin: 0.4 mg/dL (ref 0.2–1.2)
Total Protein: 6.9 g/dL (ref 6.1–8.1)

## 2017-06-17 LAB — CBC
HCT: 41.7 % (ref 38.5–50.0)
Hemoglobin: 14.1 g/dL (ref 13.2–17.1)
MCH: 29 pg (ref 27.0–33.0)
MCHC: 33.8 g/dL (ref 32.0–36.0)
MCV: 85.8 fL (ref 80.0–100.0)
MPV: 9.2 fL (ref 7.5–12.5)
PLATELETS: 279 10*3/uL (ref 140–400)
RBC: 4.86 10*6/uL (ref 4.20–5.80)
RDW: 12.8 % (ref 11.0–15.0)
WBC: 5.1 10*3/uL (ref 3.8–10.8)

## 2017-06-17 LAB — LIPID PANEL
CHOLESTEROL: 186 mg/dL (ref <200)
HDL: 38 mg/dL — AB (ref >40)
LDL Cholesterol (Calc): 116 mg/dL (calc) — ABNORMAL HIGH
NON-HDL CHOLESTEROL (CALC): 148 mg/dL — AB (ref <130)
Total CHOL/HDL Ratio: 4.9 (calc) (ref <5.0)
Triglycerides: 202 mg/dL — ABNORMAL HIGH (ref <150)

## 2017-06-17 LAB — POCT GLYCOSYLATED HEMOGLOBIN (HGB A1C): Hemoglobin A1C: 7.5

## 2017-06-17 LAB — GLUCOSE, CAPILLARY: Glucose-Capillary: 215 mg/dL — ABNORMAL HIGH (ref 65–99)

## 2017-06-17 MED ORDER — INSULIN GLARGINE 100 UNIT/ML ~~LOC~~ SOLN: 40.0000 [IU] | Freq: Every day | SUBCUTANEOUS | 11 refills | Status: DC

## 2017-06-17 MED ORDER — PRAVASTATIN SODIUM 80 MG PO TABS: 80.0000 mg | ORAL_TABLET | Freq: Every day | ORAL | 1 refills | Status: DC

## 2017-06-17 MED ORDER — GLUCOSE BLOOD VI STRP: ORAL_STRIP | 12 refills | Status: DC

## 2017-06-17 MED ORDER — TRAMADOL HCL 50 MG PO TABS: 50.0000 mg | ORAL_TABLET | Freq: Four times a day (QID) | ORAL | 0 refills | Status: DC | PRN

## 2017-06-17 MED ORDER — LISINOPRIL 20 MG PO TABS: 20.0000 mg | ORAL_TABLET | Freq: Every day | ORAL | 1 refills | Status: DC

## 2017-06-17 MED ORDER — GLIPIZIDE 10 MG PO TABS: 10.0000 mg | ORAL_TABLET | Freq: Two times a day (BID) | ORAL | 1 refills | Status: DC

## 2017-06-17 MED ORDER — METFORMIN HCL 1000 MG PO TABS: 1000.0000 mg | ORAL_TABLET | Freq: Two times a day (BID) | ORAL | 1 refills | Status: DC

## 2017-06-17 MED ORDER — HYDROCHLOROTHIAZIDE 25 MG PO TABS: 25.0000 mg | ORAL_TABLET | Freq: Every day | ORAL | 1 refills | Status: DC

## 2017-06-17 NOTE — Patient Instructions (Addendum)
Your hemoglobin A1c is 7.5, I recommend that you continue Lantus at 40 units at your bedtime.  Continue metformin 1000 mg twice daily with meals.  Also continue glipizide 10 mg daily for uncontrolled type 2 diabetes.  Continue to check your feet daily to ensure that there is no breakdown.  Diabetes goal is less than 7.0. Blood pressure is at goal on current medication regimen, no changes are warranted on today.  Everything is been sent to your pharmacy. You can contact gastroenterology to schedule an appointment for you screening colonoscopy, a referral has already been sent in the left a message for you. I will also send a referral to ophthalmology, you will need a diabetic eye exam.  You received your influenza vaccination on today without complication.  Follow-up in office in 3 months for chronic conditions.   I recommend that you refrain from alcohol use due to uncontrolled diabetes.  Alcohol has a very high sugar content and can lead to hyperglycemia, which is a blood sugar greater than 300.  Any blood sugar that is greater than 200 is considered toxic. Hyperglycemia Hyperglycemia is when the sugar (glucose) level in your blood is too high. It may not cause symptoms. If you do have symptoms, they may include warning signs, such as:  Feeling more thirsty than normal.  Hunger.  Feeling tired.  Needing to pee (urinate) more than normal.  Blurry eyesight (vision).  You may get other symptoms as it gets worse, such as:  Dry mouth.  Not being hungry (loss of appetite).  Fruity-smelling breath.  Weakness.  Weight gain or loss that is not planned. Weight loss may be fast.  A tingling or numb feeling in your hands or feet.  Headache.  Skin that does not bounce back quickly when it is lightly pinched and released (poor skin turgor).  Pain in your belly (abdomen).  Cuts or bruises that heal slowly.  High blood sugar can happen to people who do or do not have diabetes. High  blood sugar can happen slowly or quickly, and it can be an emergency. Follow these instructions at home: General instructions  Take over-the-counter and prescription medicines only as told by your doctor.  Do not use products that contain nicotine or tobacco, such as cigarettes and e-cigarettes. If you need help quitting, ask your doctor.  Limit alcohol intake to no more than 1 drink per day for nonpregnant women and 2 drinks per day for men. One drink equals 12 oz of beer, 5 oz of wine, or 1 oz of hard liquor.  Manage stress. If you need help with this, ask your doctor.  Keep all follow-up visits as told by your doctor. This is important. Eating and drinking  Stay at a healthy weight.  Exercise regularly, as told by your doctor.  Drink enough fluid, especially when you: ? Exercise. ? Get sick. ? Are in hot temperatures.  Eat healthy foods, such as: ? Low-fat (lean) proteins. ? Complex carbs (complex carbohydrates), such as whole wheat bread or brown rice. ? Fresh fruits and vegetables. ? Low-fat dairy products. ? Healthy fats.  Drink enough fluid to keep your pee (urine) clear or pale yellow. If you have diabetes:  Make sure you know the symptoms of hyperglycemia.  Follow your diabetes management plan, as told by your doctor. Make sure you: ? Take insulin and medicines as told. ? Follow your exercise plan. ? Follow your meal plan. Eat on time. Do not skip meals. ? Check your  blood sugar as often as told. Make sure to check before and after exercise. If you exercise longer or in a different way than you normally do, check your blood sugar more often. ? Follow your sick day plan whenever you cannot eat or drink normally. Make this plan ahead of time with your doctor.  Share your diabetes management plan with people in your workplace, school, and household.  Check your urine for ketones when you are ill and as told by your doctor.  Carry a card or wear jewelry that says  that you have diabetes. Contact a doctor if:  Your blood sugar level is higher than 240 mg/dL (13.3 mmol/L) for 2 days in a row.  You have problems keeping your blood sugar in your target range.  High blood sugar happens often for you. Get help right away if:  You have trouble breathing.  You have a change in how you think, feel, or act (mental status).  You feel sick to your stomach (nauseous), and that feeling does not go away.  You cannot stop throwing up (vomiting). These symptoms may be an emergency. Do not wait to see if the symptoms will go away. Get medical help right away. Call your local emergency services (911 in the [place] Do not drive yourself to the hospital. Summary  Hyperglycemia is when the sugar (glucose) level in your blood is too high.  High blood sugar can happen to people who do or do not have diabetes.  Make sure you drink enough fluids, eat healthy foods, and exercise regularly.  Contact your doctor if you have problems keeping your blood sugar in your target range. This information is not intended to replace advice given to you by your health care provider. Make sure you discuss any questions you have with your health care provider. Document Released: 05/17/2009 Document Revised: 04/06/2016 Document Reviewed: 04/06/2016 Elsevier Interactive Patient Education  2017 [employer].   Hypoglycemia Hypoglycemia is when the sugar (glucose) level in the blood is too low. Symptoms of low blood sugar may include:  Feeling: ? Hungry. ? Worried or nervous (anxious). ? Sweaty and clammy. ? Confused. ? Dizzy. ? Sleepy. ? Sick to your stomach (nauseous).  Having: ? A fast heartbeat. ? A headache. ? A change in your vision. ? Jerky movements that you cannot control (seizure). ? Nightmares. ? Tingling or no feeling (numbness) around the mouth, lips, or tongue.  Having trouble with: ? Talking. ? Paying attention (concentrating). ? Moving  (coordination). ? Sleeping.  Shaking.  Passing out (fainting).  Getting upset easily (irritability).  Low blood sugar can happen to people who have diabetes and people who do not have diabetes. Low blood sugar can happen quickly, and it can be an emergency. Treating Low Blood Sugar Low blood sugar is often treated by eating or drinking something sugary right away. If you can think clearly and swallow safely, follow the 15:15 rule:  Take 15 grams of a fast-acting carb (carbohydrate). Some fast-acting carbs are: ? 1 tube of glucose gel. ? 3 sugar tablets (glucose pills). ? 6-8 pieces of hard candy. ? 4 oz (120 mL) of fruit juice. ? 4 oz (120 mL) of regular (not diet) soda.  Check your blood sugar 15 minutes after you take the carb.  If your blood sugar is still at or below 70 mg/dL (3.9 mmol/L), take 15 grams of a carb again.  If your blood sugar does not go above 70 mg/dL (3.9 mmol/L) after 3 tries,  get help right away.  After your blood sugar goes back to normal, eat a meal or a snack within 1 hour.  Treating Very Low Blood Sugar If your blood sugar is at or below 54 mg/dL (3 mmol/L), you have very low blood sugar (severe hypoglycemia). This is an emergency. Do not wait to see if the symptoms will go away. Get medical help right away. Call your local emergency services (911 in the [place] Do not drive yourself to the hospital. If you have very low blood sugar and you cannot eat or drink, you may need a glucagon shot (injection). A family member or friend should learn how to check your blood sugar and how to give you a glucagon shot. Ask your doctor if you need to have a glucagon shot kit at home. Follow these instructions at home: General instructions  Avoid any diets that cause you to not eat enough food. Talk with your doctor before you start any new diet.  Take over-the-counter and prescription medicines only as told by your doctor.  Limit alcohol to no more than 1 drink per  day for nonpregnant women and 2 drinks per day for men. One drink equals 12 oz of beer, 5 oz of wine, or 1 oz of hard liquor.  Keep all follow-up visits as told by your doctor. This is important. If You Have Diabetes:   Make sure you know the symptoms of low blood sugar.  Always keep a source of sugar with you, such as: ? Sugar. ? Sugar tablets. ? Glucose gel. ? Fruit juice. ? Regular soda (not diet soda). ? Milk. ? Hard candy. ? Honey.  Take your medicines as told.  Follow your exercise and meal plan. ? Eat on time. Do not skip meals. ? Follow your sick day plan when you cannot eat or drink normally. Make this plan ahead of time with your doctor.  Check your blood sugar as often as told by your doctor. Always check before and after exercise.  Share your diabetes care plan with: ? Your work or school. ? People you live with.  Check your pee (urine) for ketones: ? When you are sick. ? As told by your doctor.  Carry a card or wear jewelry that says you have diabetes. If You Have Low Blood Sugar From Other Causes:   Check your blood sugar as often as told by your doctor.  Follow instructions from your doctor about what you cannot eat or drink. Contact a doctor if:  You have trouble keeping your blood sugar in your target range.  You have low blood sugar often. Get help right away if:  You still have symptoms after you eat or drink something sugary.  Your blood sugar is at or below 54 mg/dL (3 mmol/L).  You have jerky movements that you cannot control.  You pass out. These symptoms may be an emergency. Do not wait to see if the symptoms will go away. Get medical help right away. Call your local emergency services (911 in the [place] Do not drive yourself to the hospital. This information is not intended to replace advice given to you by your health care provider. Make sure you discuss any questions you have with your health care provider. Document Released:  10/14/2009 Document Revised: 12/26/2015 Document Reviewed: 08/23/2015 Elsevier Interactive Patient Education  [employer].

## 2017-06-17 NOTE — Progress Notes (Signed)
Subjective:    Patient ID: [name], male    DOB: 12/18/1965, 51 y.o.   MRN: [id number]  [name], a 51 year old male with a history of diabetes type 2, hypertension, and hyperlipidemia presents for a follow up. Patient has had frequent no show/no call appointments over the past several months.   He has a history of type 2 diabetes. He was diagnosed with diabetes mellitus several years ago. He takes medications consistently, but does not exercise or follow a carbohydrate modified diet. He reports neuropathy.He says that he typically takes Lantus 32 units twice daily, which is not the was medication was prescribed. He says that he has been taking it that way for years, so that he does not have to modify diet.  He endorses bilateral lower extremity neuropathy. He has been taking Gabapentin, with sustained relief. He currently denies fatigue, polyuria, polydipsia, or polyphagia.  [name] also has a history of hypertension and hyperlipidemia. He is taking medications consistently. He does not follow a low fat, low sodium diet. He denies shortness of breath, chest pain, lower extremity edema, tachypnea, or syncope.   [name]'s cardiac risk factors include everyday tobacco use and sedentary life style. He is not ready to quit smoking. He has attempted smoking cessation in the past. He continues to smoke 3-4 cigarettes per day.     He has been unable to follow an exercise regimen due to right hip pain. He says that he has sustained injuries to right hip in the past. He was referred to pain management on last office visit, but has not scheduled an appointment, he is requesting Tramadol on today.      Past Medical History:  Diagnosis Date  . Diabetes mellitus   . Elevated cholesterol   . Hypertension    Social History   Socioeconomic History  . Marital status: Single    Spouse name: Not on file  . Number of children: Not on file  . Years of education: Not on file  . Highest  education level: Not on file  Social Needs  . Financial resource strain: Not on file  . Food insecurity - worry: Not on file  . Food insecurity - inability: Not on file  . Transportation needs - medical: Not on file  . Transportation needs - non-medical: Not on file  Occupational History  . Not on file  Tobacco Use  . Smoking status: Current Every Day Smoker    Packs/day: 1.00    Types: Cigarettes  . Smokeless tobacco: Never Used  Substance and Sexual Activity  . Alcohol use: Yes    Alcohol/week: 7.0 oz    Types: 14 Standard drinks or equivalent per week    Comment: some   . Drug use: No  . Sexual activity: Yes  Other Topics Concern  . Not on file  Social History Narrative  . Not on file   Immunization History  Administered Date(s) Administered  . Influenza,inj,Quad PF,6+ Mos 06/17/2017  . Td 06/27/2011    Review of Systems  Constitutional: Negative.  Negative for fatigue, fever and unexpected weight change.  HENT: Negative.   Eyes: Positive for redness.  Respiratory: Negative.  Negative for apnea, chest tightness and shortness of breath.   Cardiovascular: Negative.   Gastrointestinal: Negative.   Endocrine: Negative.  Negative for cold intolerance, heat intolerance, polydipsia, polyphagia and polyuria.  Genitourinary: Negative.   Musculoskeletal: Positive for arthralgias. Negative for neck pain and neck stiffness.  Right hip pain  Skin: Negative.   Allergic/Immunologic: Negative.  Negative for environmental allergies, food allergies and immunocompromised state.  Neurological: Positive for numbness (Neuropathy to lower extremities).  Hematological: Negative.   Psychiatric/Behavioral: Negative.        Objective:   Physical Exam  Constitutional: He is oriented to person, place, and time.  HENT:  Head: Normocephalic and atraumatic.  Right Ear: External ear normal.  Left Ear: External ear normal.  Nose: Nose normal.  Mouth/Throat: Oropharynx is clear and  moist.  Eyes: Conjunctivae and EOM are normal. Pupils are equal, round, and reactive to light.  Neck: Normal range of motion. Neck supple.  Cardiovascular: Normal rate, regular rhythm, normal heart sounds and intact distal pulses.  Pulmonary/Chest: Effort normal and breath sounds normal.  Abdominal: Soft. Bowel sounds are normal.  Musculoskeletal:       Right hip: He exhibits decreased range of motion and decreased strength.  Neurological: He is alert and oriented to person, place, and time. He has normal reflexes.  Skin: Skin is warm and dry.  Skin graft to left forearm  Psychiatric: He has a normal mood and affect. His behavior is normal. Judgment and thought content normal.      BP (!) 146/90 Comment: manually  Pulse 88   Temp 98.3 F (36.8 C) (Oral)   Resp 16   Ht 5' 7"  (1.702 m)   Wt 180 lb (81.6 kg)   SpO2 99%   BMI 28.19 kg/m   Assessment & Plan:  1. Type 2 diabetes mellitus with complication, with long-term current use of insulin (HCC) Hemoglobin a1C is 7.5. Discussed the importance of taking insulin as prescribed. Will start Lantus 40 units at bedtime. All other medications continued.  Recommend that he follow a carbohydrate modified diet throughout the day.  - Glucose, capillary - HgB A1c - POCT urinalysis dip (device) - insulin glargine (LANTUS) 100 UNIT/ML injection; Inject 0.4 mLs (40 Units total) at bedtime into the skin.  Dispense: 10 mL; Refill: 11 - glipiZIDE (GLUCOTROL) 10 MG tablet; Take 1 tablet (10 mg total) 2 (two) times daily before a meal by mouth.  Dispense: 120 tablet; Refill: 1 - metFORMIN (GLUCOPHAGE) 1000 MG tablet; Take 1 tablet (1,000 mg total) 2 (two) times daily by mouth.  Dispense: 120 tablet; Refill: 1 - glucose blood test strip; Check blood sugar 3 times per day prior to meals. Use as instructed  Dispense: 100 each; Refill: 12 - COMPLETE METABOLIC PANEL WITH GFR - CBC  2. Essential hypertension Blood pressure is at goal on current medication  regimen. No changes warranted.  - POCT urinalysis dip (device) - lisinopril (PRINIVIL,ZESTRIL) 20 MG tablet; Take 1 tablet (20 mg total) daily by mouth.  Dispense: 90 tablet; Refill: 1 - hydrochlorothiazide (HYDRODIURIL) 25 MG tablet; Take 1 tablet (25 mg total) daily by mouth.  Dispense: 90 tablet; Refill: 1 - COMPLETE METABOLIC PANEL WITH GFR - CBC  3. Hyperlipidemia, unspecified hyperlipidemia type - pravastatin (PRAVACHOL) 80 MG tablet; Take 1 tablet (80 mg total) daily by mouth.  Dispense: 90 tablet; Refill: 1 - Lipid Panel  4. Need for immunization against influenza - Flu Vaccine QUAD 36+ mos IM  5. Right hip pain - traMADol (ULTRAM) 50 MG tablet; Take 1 tablet (50 mg total) every 6 (six) hours as needed by mouth.  Dispense: 30 tablet; Refill: 0 - Ambulatory referral to Pain Clinic  6. Impacted cerumen of right ear - Ear Lavage  7. Tobacco dependence Smoking cessation instruction/counseling given:  counseled patient on the dangers of tobacco use, advised patient to stop smoking, and reviewed strategies to maximize success    RTC: 3 months for chronic conditions  Donia Pounds  MSN, FNP-C [facility] [address] [place], [place] [address] [phone]

## 2017-06-21 ENCOUNTER — Other Ambulatory Visit: Payer: Self-pay | Admitting: Family Medicine

## 2017-06-21 ENCOUNTER — Telehealth: Payer: Self-pay

## 2017-06-21 DIAGNOSIS — B1920 Unspecified viral hepatitis C without hepatic coma: Secondary | ICD-10-CM

## 2017-06-21 NOTE — Telephone Encounter (Signed)
-----   Message from Dorena Dew, [place] sent at 06/19/2017  8:32 AM EST ----- Regarding: lab results Please inquire whether patient has been treated in the past for hepatitis C. Hepatitis C was confirmed positive in May during appoitnment to establish care. If he has not been treated, I will send a referral to [facility]. Also, discuss the importance of refraining from daily alcohol or acetaminiphen use.   Cholesterol is elevated, Recommend a lowfat, low carbohydrate diet divided over 5-6 small meals, increase water intake to 6-8 glasses, and 150 minutes per week of cardiovascular exercise.    Thanks.

## 2017-06-21 NOTE — Telephone Encounter (Signed)
Called and spoke with patient. He has never been treated for Hep C and I advised that we will send in referral for this to [facility]. Advised to refrain from using alcohol daily or acetaminophen.   Advised that cholesterol was elevated and that he needs to eat a lowfat/low carb diet over 5 to 6 small meals daily. Advised to drink 6 to 8 glasses of water daily and exercise 150 minutes weekly of cardio. Patient verbalized understanding.

## 2017-07-01 ENCOUNTER — Encounter: Payer: Self-pay | Admitting: Internal Medicine

## 2017-07-08 ENCOUNTER — Encounter: Payer: Self-pay | Admitting: Internal Medicine

## 2017-08-03 DIAGNOSIS — C229 Malignant neoplasm of liver, not specified as primary or secondary: Secondary | ICD-10-CM

## 2017-08-03 HISTORY — DX: Malignant neoplasm of liver, not specified as primary or secondary: C22.9

## 2017-08-17 ENCOUNTER — Encounter: Payer: Self-pay | Admitting: Internal Medicine

## 2017-09-17 ENCOUNTER — Ambulatory Visit: Payer: Self-pay | Admitting: Family Medicine

## 2017-09-23 ENCOUNTER — Ambulatory Visit (INDEPENDENT_AMBULATORY_CARE_PROVIDER_SITE_OTHER): Payer: Medicare Other | Admitting: Internal Medicine

## 2017-09-23 ENCOUNTER — Encounter: Payer: Self-pay | Admitting: Internal Medicine

## 2017-09-23 DIAGNOSIS — B182 Chronic viral hepatitis C: Secondary | ICD-10-CM | POA: Insufficient documentation

## 2017-09-23 NOTE — Progress Notes (Signed)
[facility]   CC: consideration for treatment for chronic hepatitis C  HPI:  +[name] is a 52 y.o. male who presents for initial evaluation and management of chronic hepatitis C.  Patient tested positive in May, 2018 during routine screening. Hepatitis C-associated risk factors present are: none. Patient denies history of blood transfusion, IV drug abuse. Patient has had other studies performed. Results: hepatitis C RNA by PCR, result: positive. Patient has not had prior treatment for Hepatitis C. Patient does not have a past history of liver disease. Patient does not have a family history of liver disease. Patient does not  have associated signs or symptoms related to liver disease.  Labs reviewed and confirm chronic hepatitis C with a positive viral load.   Records reviewed from Epic and did have a positive viral load last year, negative HIV.       Patient does not have documented immunity to Hepatitis A. Patient does not have documented immunity to Hepatitis B.    Review of Systems:  Constitutional: negative for fatigue and malaise Gastrointestinal: negative for diarrhea Integument/breast: negative for rash Hematologic/lymphatic: negative for lymphadenopathy All other systems reviewed and are negative       Past Medical History:  Diagnosis Date  . Diabetes mellitus   . Elevated cholesterol   . Hypertension     Prior to Admission medications   Medication Sig Start Date End Date Taking? Authorizing Provider  gabapentin (NEURONTIN) 300 MG capsule Take 1 capsule (300 mg total) by mouth 3 (three) times daily. 12/01/16  Yes Dorena Dew, FNP  glipiZIDE (GLUCOTROL) 10 MG tablet Take 1 tablet (10 mg total) 2 (two) times daily before a meal by mouth. 06/17/17  Yes Dorena Dew, FNP  hydrochlorothiazide (HYDRODIURIL) 25 MG tablet Take 1 tablet (25 mg total) daily by mouth. 06/17/17  Yes Dorena Dew, FNP  insulin glargine (LANTUS) 100  UNIT/ML injection Inject 0.4 mLs (40 Units total) at bedtime into the skin. 06/17/17  Yes Dorena Dew, FNP  lisinopril (PRINIVIL,ZESTRIL) 20 MG tablet Take 1 tablet (20 mg total) daily by mouth. 06/17/17  Yes Dorena Dew, FNP  metFORMIN (GLUCOPHAGE) 1000 MG tablet Take 1 tablet (1,000 mg total) 2 (two) times daily by mouth. 06/17/17  Yes Dorena Dew, FNP  pravastatin (PRAVACHOL) 80 MG tablet Take 1 tablet (80 mg total) daily by mouth. 06/17/17  Yes Dorena Dew, FNP  senna (SENOKOT) 8.6 MG tablet Take 1 tablet by mouth as needed for constipation.   Yes [provider]  glucose blood test strip Check blood sugar 3 times per day prior to meals. Use as instructed 06/17/17   Dorena Dew, FNP  traMADol (ULTRAM) 50 MG tablet Take 1 tablet (50 mg total) every 6 (six) hours as needed by mouth. 06/17/17   Dorena Dew, FNP    No Known Allergies  Social History   Tobacco Use  . Smoking status: Current Every Day Smoker    Packs/day: 1.00    Types: Cigarettes  . Smokeless tobacco: Never Used  Substance Use Topics  . Alcohol use: Yes    Alcohol/week: 7.0 oz    Types: 14 Standard drinks or equivalent per week    Comment: some   . Drug use: No    Family History  Problem Relation Age of Onset  . Diabetes Unknown   . Hypertension Unknown      Objective:  Constitutional: in no apparent distress  and alert,  Vitals:   09/23/17 1048  BP: 123/84  Pulse: 85  Temp: 98.2 F (36.8 C)   Eyes: anicteric Cardiovascular: Cor RRR Respiratory: CTA B; normal respiratory effort Gastrointestinal: Bowel sounds are normal, liver is not enlarged, spleen is not enlarged Musculoskeletal: no pedal edema noted Skin: negatives: no rash; no porphyria cutanea tarda Lymphatic: no cervical lymphadenopathy   Laboratory Genotype: No results found for: HCVGENOTYPE HCV viral load:  Lab Results  Component Value Date   HCVQUANT 15,000,000 (H) 12/01/2016   Lab Results    Component Value Date   WBC 5.1 06/17/2017   HGB 14.1 06/17/2017   HCT 41.7 06/17/2017   MCV 85.8 06/17/2017   PLT 279 06/17/2017    Lab Results  Component Value Date   CREATININE 1.37 (H) 06/17/2017   BUN 18 06/17/2017   NA 140 06/17/2017   K 4.3 06/17/2017   CL 105 06/17/2017   CO2 28 06/17/2017    Lab Results  Component Value Date   ALT 68 (H) 06/17/2017   AST 51 (H) 06/17/2017   ALKPHOS 52 12/01/2016     Labs and history reviewed and show CHILD-PUGH unknown  5-6 points: Child class A 7-9 points: Child class B 10-15 points: Child class C  Lab Results  Component Value Date   BILITOT 0.4 06/17/2017   ALBUMIN 3.7 12/01/2016     Assessment: New Patient with Chronic Hepatitis C genotype unknown, untreated.  I discussed with the patient the lab findings that confirm chronic hepatitis C as well as the natural history and progression of disease including about 30% of people who develop cirrhosis of the liver if left untreated and once cirrhosis is established there is a 2-7% risk per year of liver cancer and liver failure.  I discussed the importance of treatment and benefits in reducing the risk, even if significant liver fibrosis exists.   Plan: 1) Patient counseled extensively on limiting acetaminophen to no more than 2 grams daily, avoidance of alcohol. 2) Transmission discussed with patient including sexual transmission, sharing razors and toothbrush.   3) Will need referral to gastroenterology if concern for cirrhosis 4) Will need referral for substance abuse counseling: No.; Further work up to include urine drug screen  No. 5) Will prescribe appropriate medication based on genotype and coverage  6) Hepatitis A and B titers 7) Pneumovax vaccine next visit 9) Further work up to include liver staging with elastography 10) will follow up after labs, elastography

## 2017-09-24 LAB — COMPLETE METABOLIC PANEL WITH GFR
AG RATIO: 1.4 (calc) (ref 1.0–2.5)
ALBUMIN MSPROF: 3.9 g/dL (ref 3.6–5.1)
ALT: 80 U/L — ABNORMAL HIGH (ref 9–46)
AST: 52 U/L — AB (ref 10–35)
Alkaline phosphatase (APISO): 49 U/L (ref 40–115)
BUN: 21 mg/dL (ref 7–25)
CO2: 28 mmol/L (ref 20–32)
Calcium: 9.8 mg/dL (ref 8.6–10.3)
Chloride: 106 mmol/L (ref 98–110)
Creat: 1.24 mg/dL (ref 0.70–1.33)
GFR, Est African American: 78 mL/min/{1.73_m2} (ref >=60)
GFR, Est Non African American: 67 mL/min/{1.73_m2} (ref >=60)
GLOBULIN: 2.8 g/dL (ref 1.9–3.7)
Glucose, Bld: 136 mg/dL — ABNORMAL HIGH (ref 65–99)
POTASSIUM: 4.7 mmol/L (ref 3.5–5.3)
SODIUM: 142 mmol/L (ref 135–146)
Total Bilirubin: 0.4 mg/dL (ref 0.2–1.2)
Total Protein: 6.7 g/dL (ref 6.1–8.1)

## 2017-09-24 LAB — CBC WITH DIFFERENTIAL/PLATELET
BASOS PCT: 0.7 %
Basophils Absolute: 39 cells/uL (ref 0–200)
EOS ABS: 83 {cells}/uL (ref 15–500)
Eosinophils Relative: 1.5 %
HCT: 42.4 % (ref 38.5–50.0)
Hemoglobin: 14.7 g/dL (ref 13.2–17.1)
Lymphs Abs: 2761 cells/uL (ref 850–3900)
MCH: 29.5 pg (ref 27.0–33.0)
MCHC: 34.7 g/dL (ref 32.0–36.0)
MCV: 85.1 fL (ref 80.0–100.0)
MONOS PCT: 8.5 %
MPV: 9 fL (ref 7.5–12.5)
Neutro Abs: 2151 cells/uL (ref 1500–7800)
Neutrophils Relative %: 39.1 %
PLATELETS: 302 10*3/uL (ref 140–400)
RBC: 4.98 10*6/uL (ref 4.20–5.80)
RDW: 12.5 % (ref 11.0–15.0)
TOTAL LYMPHOCYTE: 50.2 %
WBC mixed population: 468 cells/uL (ref 200–950)
WBC: 5.5 10*3/uL (ref 3.8–10.8)

## 2017-09-24 LAB — HIV ANTIBODY (ROUTINE TESTING W REFLEX): HIV 1&2 Ab, 4th Generation: NONREACTIVE

## 2017-09-24 LAB — PROTIME-INR
INR: 1
Prothrombin Time: 10.4 s (ref 9.0–11.5)

## 2017-09-24 LAB — HEPATITIS B CORE ANTIBODY, TOTAL: HEP B C TOTAL AB: NONREACTIVE

## 2017-09-24 LAB — HEPATITIS B SURFACE ANTIGEN: Hepatitis B Surface Ag: NONREACTIVE

## 2017-09-24 LAB — HEPATITIS A ANTIBODY, TOTAL: Hepatitis A AB,Total: NONREACTIVE

## 2017-09-24 LAB — HEPATITIS B SURFACE ANTIBODY,QUALITATIVE: HEP B S AB: NONREACTIVE

## 2017-09-26 LAB — LIVER FIBROSIS, FIBROTEST-ACTITEST
ALT: 78 U/L — AB (ref 9–46)
APOLIPOPROTEIN A1: 140 mg/dL (ref 94–176)
Alpha-2-Macroglobulin: 358 mg/dL — ABNORMAL HIGH (ref 106–279)
BILIRUBIN: 0.3 mg/dL (ref 0.2–1.2)
Fibrosis Score: 0.64
GGT: 313 U/L — AB (ref 3–95)
HAPTOGLOBIN: 150 mg/dL (ref 43–212)
Necroinflammat ACT Score: 0.59
Reference ID: 2345620

## 2017-09-27 ENCOUNTER — Ambulatory Visit: Payer: Self-pay | Admitting: Family Medicine

## 2017-09-27 LAB — HEPATITIS C RNA QUANTITATIVE
HCV Quantitative Log: 6.82 Log IU/mL — ABNORMAL HIGH
HCV RNA, PCR, QN: 6550000 [IU]/mL — AB

## 2017-09-27 LAB — HEPATITIS C GENOTYPE

## 2017-10-19 ENCOUNTER — Telehealth: Payer: Self-pay

## 2017-10-19 DIAGNOSIS — E118 Type 2 diabetes mellitus with unspecified complications: Secondary | ICD-10-CM

## 2017-10-19 DIAGNOSIS — Z794 Long term (current) use of insulin: Principal | ICD-10-CM

## 2017-10-19 MED ORDER — GLUCOSE BLOOD VI STRP: ORAL_STRIP | 12 refills | Status: DC

## 2017-10-19 NOTE — Telephone Encounter (Signed)
Called and left a message for patient to call back. Thanks!

## 2017-10-19 NOTE — Telephone Encounter (Signed)
Medication refilled

## 2017-10-26 ENCOUNTER — Ambulatory Visit: Payer: Self-pay | Admitting: Internal Medicine

## 2017-11-14 ENCOUNTER — Other Ambulatory Visit: Payer: Self-pay | Admitting: Family Medicine

## 2017-11-14 DIAGNOSIS — Z794 Long term (current) use of insulin: Principal | ICD-10-CM

## 2017-11-14 DIAGNOSIS — E118 Type 2 diabetes mellitus with unspecified complications: Secondary | ICD-10-CM

## 2017-11-16 ENCOUNTER — Encounter: Payer: Self-pay | Admitting: Internal Medicine

## 2017-11-16 ENCOUNTER — Ambulatory Visit (INDEPENDENT_AMBULATORY_CARE_PROVIDER_SITE_OTHER): Payer: Medicare Other | Admitting: Internal Medicine

## 2017-11-16 VITALS — BP 153/80 | HR 72 | Temp 97.9°F | Resp 18 | Ht 67.0 in | Wt 168.0 lb

## 2017-11-16 DIAGNOSIS — Z789 Other specified health status: Secondary | ICD-10-CM | POA: Diagnosis not present

## 2017-11-16 DIAGNOSIS — B182 Chronic viral hepatitis C: Secondary | ICD-10-CM

## 2017-11-16 DIAGNOSIS — K7400 Hepatic fibrosis, unspecified: Secondary | ICD-10-CM

## 2017-11-16 DIAGNOSIS — Z7189 Other specified counseling: Secondary | ICD-10-CM | POA: Insufficient documentation

## 2017-11-16 DIAGNOSIS — Z7185 Encounter for immunization safety counseling: Secondary | ICD-10-CM

## 2017-11-16 DIAGNOSIS — Z7289 Other problems related to lifestyle: Secondary | ICD-10-CM

## 2017-11-16 MED ORDER — SOFOSBUVIR-VELPATASVIR 400-100 MG PO TABS: 1.0000 | ORAL_TABLET | Freq: Every day | ORAL | 2 refills | Status: DC

## 2017-11-16 NOTE — Progress Notes (Signed)
   Subjective:    Patient ID: [name], male    DOB: April 04, 1966, 52 y.o.   MRN: [id number]  HPI Here for follow up of chronic hepatitis C He has genotype 1a, hepatitis A and B non -immune.  Fibrosure F3 but elastography not yet done.  He has no associated fatigue. + trasnaminitis, normal platelets.  No complaints.    Review of Systems  Constitutional: Negative for fatigue.  Gastrointestinal: Negative for diarrhea.  Skin: Negative for rash.       Objective:   Physical Exam  Constitutional: He appears well-developed and well-nourished. No distress.  HENT:  Mouth/Throat: No oropharyngeal exudate.  Eyes: No scleral icterus.  Cardiovascular: Normal rate, regular rhythm and normal heart sounds.  No murmur heard. Pulmonary/Chest: Effort normal and breath sounds normal. No respiratory distress.  Skin: No rash noted.   SH: + alcohol       Assessment & Plan:

## 2017-11-16 NOTE — Assessment & Plan Note (Signed)
F3 and normal platelets.  Will check elastography to decide if [place] screening indicated.

## 2017-11-16 NOTE — Assessment & Plan Note (Signed)
Advised to avoid alcohol

## 2017-11-16 NOTE — Patient Instructions (Signed)
Date 11/16/17  Dear Mr. Konczal, As discussed in the ID Clinic, your hepatitis C therapy will include the following medications:    sofosbuvir 400 mg/velpatasvir 100 mg (Epclusa) oral daily  Please note that ALL MEDICATIONS WILL START ON THE SAME DATE for a total of 12 weeks. ---------------------------------------------------------------- Your HCV Treatment Start Date: TBA   Your HCV genotype: 1a    Liver Fibrosis: F3    ---------------------------------------------------------------- YOUR PHARMACY CONTACT (depending on your insurance):   [facility] [place], [place] [address] Phone: [phone] Hours: Monday to Friday 7:30 am to 6:00 pm   Please always contact your pharmacy at least 3-4 business days before you run out of medications to ensure your next month's medication is ready or 1 week prior to running out if you receive it by mail.  Remember, each prescription is for 28 days. ---------------------------------------------------------------- GENERAL NOTES REGARDING YOUR HEPATITIS C MEDICATION:  SOFOSBUVIR (SOVALDI or EPCLUSA): - Sofosbuvir 400 mg tablet is taken daily with OR without food. - The sofosbuvir tablets are yellow. - The Epclusa tablets are pink, diamond-shaped - The tablets should be stored at room temperature. - The most common side effects with sofosbuvir or Epclusa include:      1. Fatigue      2. Headache      3. Nausea      4. Diarrhea      5. Insomnia  - Acid reducing agents such as H2 blockers (ie. Pepcid (famotidine), Zantac (ranitidine), Tagamet (cimetidine), Axid (nizatidine) and proton pump inhibitors (ie. Prilosec (omeprazole), Protonix (pantoprazole), Nexium (esomeprazole), or Aciphex (rabeprazole)) can decrease effectiveness of Harvoni. Do not take until you have discussed with a health care provider.    -Antacids that contain magnesium and/or aluminum hydroxide (ie. Milk of Magensia, Rolaids, Gaviscon, Maalox,  Mylanta, an dArthritis Pain Formula)can reduce absorption of sofosbuvir, so take them at least 4 hours before or after Harvoni.  -Calcium carbonate (calcium supplements or antacids such as Tums, Caltrate, Os-Cal)needs to be taken at least 4 hours hours before or after sofosbuvir.  -St. John's wort or any products that contain St. John's wort like some herbal supplements  Please inform the office prior to starting any of these medications.   Please note that this only lists the most common side effects and is NOT a comprehensive list of the potential side effects of these medications. For more information, please review the drug information sheets that come with your medication package from the pharmacy.  ---------------------------------------------------------------- GENERAL HELPFUL HINTS ON HCV THERAPY: 1. No alcohol. 2. Stay well-hydrated 3. Notify the ID Clinic of any changes in your other over-the-counter/herbal or prescription medications. 4. If you miss a dose of your medication, take the missed dose as soon as you remember. Return to your regular time/dose schedule the next day.  5.  Do not stop taking your medications without first talking with your healthcare provider. 6.  You may take Tylenol (acetaminophen), as long as the dose is less than 2000 mg (OR no more than 4 tablets of the Tylenol Extra Strengths 537m tablet) in 24 hours. 7. You will follow up with our clinic pharmacist initially after starting the medication to monitor for any possible side effects 8. You will get labs once during treatment, soon after treatment completion and again 6 months or more after treatment completion to verify the virus is completely gone.   RThayer Headings [facility] [address] [place], [place]  [address] [phone]

## 2017-11-16 NOTE — Assessment & Plan Note (Signed)
Will start hepatitis A and B series. Pneumovax today

## 2017-11-16 NOTE — Assessment & Plan Note (Signed)
On pravastatin so ideal treatment will be Epclusa due to interactions with other statins.  Will wait until after the elastography to start.

## 2017-11-26 ENCOUNTER — Telehealth: Payer: Self-pay | Admitting: Pharmacist

## 2017-11-26 NOTE — Telephone Encounter (Signed)
Agree, yes, I would defer.

## 2017-11-26 NOTE — Telephone Encounter (Signed)
Patient's wife called regarding patient's Hep C treatment.  They are going to schedule the elastography soon, so he has not started the Epclusa yet.  His wife states she was just diagnosed with a H pylori infection and was started on clarithromycin, amoxicillin, and omeprazole.  Her husband has no symptoms but is going to be tested on Monday. She is asking if his Hep C treatment would interact with possible treatment for H pylori.  I told her that it most likely would since they often contain some type of acid suppressive agent.  She will call me back next week after he sees the doctor.  We may have to defer Hep C treatment until he's completed H pylori treatment if needed.

## 2017-11-29 ENCOUNTER — Other Ambulatory Visit: Payer: Self-pay

## 2017-12-07 ENCOUNTER — Ambulatory Visit (HOSPITAL_COMMUNITY)
Admission: RE | Admit: 2017-12-07 | Discharge: 2017-12-07 | Disposition: A | Discharge status: Home / Self Care | Payer: Medicare Other | Source: Ambulatory Visit | Attending: Internal Medicine | Admitting: Internal Medicine

## 2017-12-07 ENCOUNTER — Telehealth: Payer: Self-pay | Admitting: *Deleted

## 2017-12-07 ENCOUNTER — Other Ambulatory Visit: Payer: Self-pay | Admitting: Internal Medicine

## 2017-12-07 DIAGNOSIS — B182 Chronic viral hepatitis C: Secondary | ICD-10-CM | POA: Insufficient documentation

## 2017-12-07 DIAGNOSIS — R160 Hepatomegaly, not elsewhere classified: Secondary | ICD-10-CM | POA: Diagnosis not present

## 2017-12-07 DIAGNOSIS — K769 Liver disease, unspecified: Secondary | ICD-10-CM

## 2017-12-07 DIAGNOSIS — C220 Liver cell carcinoma: Secondary | ICD-10-CM | POA: Insufficient documentation

## 2017-12-07 NOTE — Telephone Encounter (Signed)
I spoke with Mr Lienhard about the findings and need for an MRI.  Ihave ordered it and if we can get it asap. thanks

## 2017-12-07 NOTE — Telephone Encounter (Signed)
[facility] calling to alert about Ultrasound Report. Please advise. Landis Gandy, RN

## 2017-12-07 NOTE — Telephone Encounter (Signed)
Will contact [facility] to schedule ASAP (closed after 5:00). Patient is currently scheduled Friday 5/10 at [facility] (6:30 am arrival). NPO after midnight.  Will need bloodwork at [facility] first. If able to get into [facility] first, will cancel [facility] [employer].  Patient may require prior authorization, will 'cc Marena Chancy.

## 2017-12-08 ENCOUNTER — Telehealth: Payer: Self-pay | Admitting: *Deleted

## 2017-12-08 DIAGNOSIS — K769 Liver disease, unspecified: Secondary | ICD-10-CM

## 2017-12-08 DIAGNOSIS — B182 Chronic viral hepatitis C: Secondary | ICD-10-CM

## 2017-12-08 NOTE — Addendum Note (Signed)
Addended by: Landis Gandy on: 12/08/2017 05:03 PM   Modules accepted: Orders

## 2017-12-08 NOTE — Telephone Encounter (Signed)
RN called patient again, but he was asleep. THe woman who answered asked if she could take a message. RN relayed the appointment at [facility] Friday, arrival at 6:30 am, nothing to eat or drink after midnight. She confirmed appointment, directions. Per radiology, patient will need labs drawn before the MRI. Patient can come 5/9 2:00 to [facility]. Please cosign orders. Landis Gandy, RN

## 2017-12-08 NOTE — Telephone Encounter (Signed)
AFP too. Thanks

## 2017-12-08 NOTE — Telephone Encounter (Signed)
Orders placed. Thanks.

## 2017-12-09 ENCOUNTER — Other Ambulatory Visit: Payer: Medicare Other

## 2017-12-09 DIAGNOSIS — K769 Liver disease, unspecified: Secondary | ICD-10-CM | POA: Diagnosis not present

## 2017-12-09 DIAGNOSIS — B182 Chronic viral hepatitis C: Secondary | ICD-10-CM

## 2017-12-10 ENCOUNTER — Telehealth: Payer: Self-pay | Admitting: Internal Medicine

## 2017-12-10 ENCOUNTER — Other Ambulatory Visit: Payer: Self-pay | Admitting: Internal Medicine

## 2017-12-10 ENCOUNTER — Ambulatory Visit (HOSPITAL_COMMUNITY)
Admission: RE | Admit: 2017-12-10 | Discharge: 2017-12-10 | Disposition: A | Discharge status: Home / Self Care | Payer: Medicare Other | Source: Ambulatory Visit | Attending: Internal Medicine | Admitting: Internal Medicine

## 2017-12-10 DIAGNOSIS — K769 Liver disease, unspecified: Secondary | ICD-10-CM | POA: Diagnosis not present

## 2017-12-10 DIAGNOSIS — R160 Hepatomegaly, not elsewhere classified: Secondary | ICD-10-CM

## 2017-12-10 DIAGNOSIS — K7689 Other specified diseases of liver: Secondary | ICD-10-CM | POA: Diagnosis not present

## 2017-12-10 LAB — CBC WITH DIFFERENTIAL/PLATELET
BASOS ABS: 18 {cells}/uL (ref 0–200)
BASOS PCT: 0.3 %
EOS ABS: 128 {cells}/uL (ref 15–500)
EOS PCT: 2.1 %
HCT: 38.7 % (ref 38.5–50.0)
Hemoglobin: 13.4 g/dL (ref 13.2–17.1)
Lymphs Abs: 3184 cells/uL (ref 850–3900)
MCH: 29.3 pg (ref 27.0–33.0)
MCHC: 34.6 g/dL (ref 32.0–36.0)
MCV: 84.7 fL (ref 80.0–100.0)
MONOS PCT: 8.9 %
MPV: 9 fL (ref 7.5–12.5)
NEUTROS PCT: 36.5 %
Neutro Abs: 2227 cells/uL (ref 1500–7800)
PLATELETS: 295 10*3/uL (ref 140–400)
RBC: 4.57 10*6/uL (ref 4.20–5.80)
RDW: 12.9 % (ref 11.0–15.0)
TOTAL LYMPHOCYTE: 52.2 %
WBC mixed population: 543 cells/uL (ref 200–950)
WBC: 6.1 10*3/uL (ref 3.8–10.8)

## 2017-12-10 LAB — COMPLETE METABOLIC PANEL WITH GFR
AG RATIO: 1.6 (calc) (ref 1.0–2.5)
ALT: 93 U/L — AB (ref 9–46)
AST: 92 U/L — AB (ref 10–35)
Albumin: 4.1 g/dL (ref 3.6–5.1)
Alkaline phosphatase (APISO): 42 U/L (ref 40–115)
BUN/Creatinine Ratio: 5 (calc) — ABNORMAL LOW (ref 6–22)
BUN: 6 mg/dL — ABNORMAL LOW (ref 7–25)
CALCIUM: 9.4 mg/dL (ref 8.6–10.3)
CO2: 27 mmol/L (ref 20–32)
Chloride: 105 mmol/L (ref 98–110)
Creat: 1.1 mg/dL (ref 0.70–1.33)
GFR, EST AFRICAN AMERICAN: 90 mL/min/{1.73_m2} (ref >=60)
GFR, EST NON AFRICAN AMERICAN: 77 mL/min/{1.73_m2} (ref >=60)
Globulin: 2.6 g/dL (calc) (ref 1.9–3.7)
Glucose, Bld: 202 mg/dL — ABNORMAL HIGH (ref 65–99)
POTASSIUM: 4.4 mmol/L (ref 3.5–5.3)
Sodium: 138 mmol/L (ref 135–146)
TOTAL PROTEIN: 6.7 g/dL (ref 6.1–8.1)
Total Bilirubin: 0.6 mg/dL (ref 0.2–1.2)

## 2017-12-10 LAB — AFP TUMOR MARKER: AFP TUMOR MARKER: 12 ng/mL — AB (ref <6.1)

## 2017-12-10 MED ORDER — DIPHENHYDRAMINE HCL 50 MG/ML IJ SOLN
INTRAMUSCULAR | Status: AC
  Filled 2017-12-10: qty 1

## 2017-12-10 MED ORDER — DIMENHYDRINATE 50 MG/ML IJ SOLN
50.0000 mg | Freq: Once | INTRAMUSCULAR | Status: DC
  Filled 2017-12-10: qty 1

## 2017-12-10 MED ORDER — DIPHENHYDRAMINE HCL 50 MG/ML IJ SOLN: 50.0000 mg | Freq: Once | INTRAMUSCULAR | Status: DC

## 2017-12-10 MED ORDER — GADOBENATE DIMEGLUMINE 529 MG/ML IV SOLN
15.0000 mL | Freq: Once | INTRAVENOUS | Status: AC | PRN
  Administered 2017-12-10: 15 mL via INTRAVENOUS

## 2017-12-10 NOTE — Telephone Encounter (Signed)
I called [name] regarding the MRI findings and concern for a cancer.  I discussed with him that it appears most c/w cancer from another source and that I am referring him to oncology for further evaluation and work up.  He voiced his understading. At this time, we will hold on treating his chronic hepatitis C until this has been addressed. Thayer Headings, MD

## 2017-12-11 ENCOUNTER — Ambulatory Visit (HOSPITAL_COMMUNITY)
Admission: EM | Admit: 2017-12-11 | Discharge: 2017-12-11 | Disposition: A | Discharge status: Home / Self Care | Payer: Medicare Other | Attending: Family Medicine | Admitting: Family Medicine

## 2017-12-11 ENCOUNTER — Ambulatory Visit (INDEPENDENT_AMBULATORY_CARE_PROVIDER_SITE_OTHER): Payer: Medicare Other

## 2017-12-11 ENCOUNTER — Encounter (HOSPITAL_COMMUNITY): Payer: Self-pay | Admitting: *Deleted

## 2017-12-11 DIAGNOSIS — M25521 Pain in right elbow: Secondary | ICD-10-CM | POA: Diagnosis not present

## 2017-12-11 DIAGNOSIS — M25551 Pain in right hip: Secondary | ICD-10-CM | POA: Diagnosis not present

## 2017-12-11 DIAGNOSIS — S79911A Unspecified injury of right hip, initial encounter: Secondary | ICD-10-CM | POA: Diagnosis not present

## 2017-12-11 DIAGNOSIS — F1020 Alcohol dependence, uncomplicated: Secondary | ICD-10-CM | POA: Diagnosis not present

## 2017-12-11 DIAGNOSIS — S299XXA Unspecified injury of thorax, initial encounter: Secondary | ICD-10-CM | POA: Diagnosis not present

## 2017-12-11 DIAGNOSIS — R0781 Pleurodynia: Secondary | ICD-10-CM

## 2017-12-11 MED ORDER — KETOROLAC TROMETHAMINE 60 MG/2ML IM SOLN
INTRAMUSCULAR | Status: AC
  Filled 2017-12-11: qty 2

## 2017-12-11 MED ORDER — TRAMADOL HCL 50 MG PO TABS: 50.0000 mg | ORAL_TABLET | Freq: Four times a day (QID) | ORAL | 0 refills | Status: DC | PRN

## 2017-12-11 MED ORDER — KETOROLAC TROMETHAMINE 30 MG/ML IJ SOLN
30.0000 mg | Freq: Once | INTRAMUSCULAR | Status: AC
  Administered 2017-12-11: 30 mg via INTRAMUSCULAR

## 2017-12-11 NOTE — Discharge Instructions (Addendum)
It was nice seeing you today. Your xray were normal without acute finding. I have escribed Tramadol to the pharmacy for pain. Use crutches to ambulate. See PCP for PT referral and pain management. Go to the ED if symptoms persists or worsens.  Please follow-up with your PCP and oncologist as planned for your MRI result. You will need PCP referral to social work for alcohol counseling and cessation.  NOTE: Your spine and neurologic exam looks good. Please see PCP on Monday. Go to the ED for CT spine if symptoms persists.
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
## 2017-12-13 ENCOUNTER — Ambulatory Visit: Payer: Self-pay | Admitting: Family Medicine

## 2017-12-13 ENCOUNTER — Telehealth: Payer: Self-pay | Admitting: Hematology

## 2017-12-13 NOTE — Telephone Encounter (Signed)
Referral from Dr. Linus Salmons from Infectious disease with a dx of a liver mass.  Pt has been scheduled to see Dr. Burr Medico on 5/14 at 230pm. Pt aware to arrive 30 minutes early.
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
## 2017-12-14 ENCOUNTER — Encounter: Payer: Self-pay | Admitting: Hematology

## 2017-12-14 ENCOUNTER — Inpatient Hospital Stay: Payer: Medicare Other | Attending: Hematology | Admitting: Hematology

## 2017-12-14 ENCOUNTER — Telehealth: Payer: Self-pay | Admitting: Hematology

## 2017-12-14 VITALS — BP 154/99 | HR 77 | Temp 98.5°F | Resp 20 | Ht 67.0 in | Wt 169.9 lb

## 2017-12-14 DIAGNOSIS — F17210 Nicotine dependence, cigarettes, uncomplicated: Secondary | ICD-10-CM

## 2017-12-14 DIAGNOSIS — E7800 Pure hypercholesterolemia, unspecified: Secondary | ICD-10-CM | POA: Diagnosis not present

## 2017-12-14 DIAGNOSIS — Z79899 Other long term (current) drug therapy: Secondary | ICD-10-CM | POA: Diagnosis not present

## 2017-12-14 DIAGNOSIS — M549 Dorsalgia, unspecified: Secondary | ICD-10-CM | POA: Diagnosis not present

## 2017-12-14 DIAGNOSIS — B182 Chronic viral hepatitis C: Secondary | ICD-10-CM | POA: Diagnosis not present

## 2017-12-14 DIAGNOSIS — K740 Hepatic fibrosis: Secondary | ICD-10-CM

## 2017-12-14 DIAGNOSIS — F1010 Alcohol abuse, uncomplicated: Secondary | ICD-10-CM | POA: Diagnosis not present

## 2017-12-14 DIAGNOSIS — C787 Secondary malignant neoplasm of liver and intrahepatic bile duct: Secondary | ICD-10-CM

## 2017-12-14 DIAGNOSIS — C220 Liver cell carcinoma: Secondary | ICD-10-CM | POA: Insufficient documentation

## 2017-12-14 DIAGNOSIS — I10 Essential (primary) hypertension: Secondary | ICD-10-CM | POA: Diagnosis not present

## 2017-12-14 DIAGNOSIS — E119 Type 2 diabetes mellitus without complications: Secondary | ICD-10-CM

## 2017-12-14 DIAGNOSIS — Z794 Long term (current) use of insulin: Secondary | ICD-10-CM

## 2017-12-14 DIAGNOSIS — R160 Hepatomegaly, not elsewhere classified: Secondary | ICD-10-CM

## 2017-12-14 MED ORDER — CYCLOBENZAPRINE HCL 5 MG PO TABS: 5.0000 mg | ORAL_TABLET | Freq: Three times a day (TID) | ORAL | 0 refills | Status: DC | PRN

## 2017-12-14 NOTE — Telephone Encounter (Signed)
Appointments scheduled AVS/Calendar printed per 5/14 los

## 2017-12-15 ENCOUNTER — Encounter: Payer: Self-pay | Admitting: Hematology

## 2017-12-22 ENCOUNTER — Encounter (HOSPITAL_COMMUNITY)
Admission: RE | Admit: 2017-12-22 | Discharge: 2017-12-22 | Disposition: A | Discharge status: Home / Self Care | Payer: Medicare Other | Source: Ambulatory Visit | Attending: Hematology | Admitting: Hematology

## 2017-12-22 ENCOUNTER — Other Ambulatory Visit: Payer: Self-pay | Admitting: Student

## 2017-12-22 ENCOUNTER — Other Ambulatory Visit: Payer: Self-pay | Admitting: Radiology

## 2017-12-22 ENCOUNTER — Telehealth: Payer: Self-pay

## 2017-12-22 DIAGNOSIS — C787 Secondary malignant neoplasm of liver and intrahepatic bile duct: Secondary | ICD-10-CM | POA: Diagnosis not present

## 2017-12-22 DIAGNOSIS — C801 Malignant (primary) neoplasm, unspecified: Secondary | ICD-10-CM | POA: Diagnosis not present

## 2017-12-22 DIAGNOSIS — E118 Type 2 diabetes mellitus with unspecified complications: Secondary | ICD-10-CM

## 2017-12-22 DIAGNOSIS — Z794 Long term (current) use of insulin: Principal | ICD-10-CM

## 2017-12-22 LAB — GLUCOSE, CAPILLARY: Glucose-Capillary: 134 mg/dL — ABNORMAL HIGH (ref 65–99)

## 2017-12-22 MED ORDER — GLUCOSE BLOOD VI STRP: ORAL_STRIP | 12 refills | Status: DC

## 2017-12-22 MED ORDER — INSULIN GLARGINE 100 UNIT/ML ~~LOC~~ SOLN: 40.0000 [IU] | Freq: Every day | SUBCUTANEOUS | 11 refills | Status: DC

## 2017-12-22 MED ORDER — FLUDEOXYGLUCOSE F - 18 (FDG) INJECTION
8.5000 | Freq: Once | INTRAVENOUS | Status: AC | PRN
  Administered 2017-12-22: 8.5 via INTRAVENOUS

## 2017-12-22 NOTE — Telephone Encounter (Signed)
Refills sent into pharmacy. Thanks!  

## 2017-12-23 ENCOUNTER — Ambulatory Visit (HOSPITAL_COMMUNITY)
Admission: RE | Admit: 2017-12-23 | Discharge: 2017-12-23 | Disposition: A | Discharge status: Home / Self Care | Payer: Medicare Other | Source: Ambulatory Visit | Attending: Hematology | Admitting: Hematology

## 2017-12-23 ENCOUNTER — Encounter (HOSPITAL_COMMUNITY): Payer: Self-pay

## 2017-12-23 DIAGNOSIS — E7800 Pure hypercholesterolemia, unspecified: Secondary | ICD-10-CM | POA: Insufficient documentation

## 2017-12-23 DIAGNOSIS — Z96641 Presence of right artificial hip joint: Secondary | ICD-10-CM | POA: Insufficient documentation

## 2017-12-23 DIAGNOSIS — C220 Liver cell carcinoma: Secondary | ICD-10-CM | POA: Insufficient documentation

## 2017-12-23 DIAGNOSIS — Z79899 Other long term (current) drug therapy: Secondary | ICD-10-CM | POA: Diagnosis not present

## 2017-12-23 DIAGNOSIS — F17210 Nicotine dependence, cigarettes, uncomplicated: Secondary | ICD-10-CM | POA: Insufficient documentation

## 2017-12-23 DIAGNOSIS — Z794 Long term (current) use of insulin: Secondary | ICD-10-CM | POA: Insufficient documentation

## 2017-12-23 DIAGNOSIS — B182 Chronic viral hepatitis C: Secondary | ICD-10-CM | POA: Diagnosis not present

## 2017-12-23 DIAGNOSIS — I10 Essential (primary) hypertension: Secondary | ICD-10-CM | POA: Diagnosis not present

## 2017-12-23 DIAGNOSIS — E119 Type 2 diabetes mellitus without complications: Secondary | ICD-10-CM | POA: Diagnosis not present

## 2017-12-23 DIAGNOSIS — R160 Hepatomegaly, not elsewhere classified: Secondary | ICD-10-CM | POA: Diagnosis not present

## 2017-12-23 DIAGNOSIS — C787 Secondary malignant neoplasm of liver and intrahepatic bile duct: Secondary | ICD-10-CM

## 2017-12-23 LAB — CBC
HEMATOCRIT: 39.9 % (ref 39.0–52.0)
Hemoglobin: 13.2 g/dL (ref 13.0–17.0)
MCH: 29.3 pg (ref 26.0–34.0)
MCHC: 33.1 g/dL (ref 30.0–36.0)
MCV: 88.7 fL (ref 78.0–100.0)
PLATELETS: 327 10*3/uL (ref 150–400)
RBC: 4.5 MIL/uL (ref 4.22–5.81)
RDW: 13.1 % (ref 11.5–15.5)
WBC: 6.5 10*3/uL (ref 4.0–10.5)

## 2017-12-23 LAB — APTT: APTT: 33 s (ref 24–36)

## 2017-12-23 LAB — PROTIME-INR
INR: 1.01
Prothrombin Time: 13.2 seconds (ref 11.4–15.2)

## 2017-12-23 LAB — GLUCOSE, CAPILLARY: Glucose-Capillary: 122 mg/dL — ABNORMAL HIGH (ref 65–99)

## 2017-12-23 MED ORDER — FENTANYL CITRATE (PF) 100 MCG/2ML IJ SOLN
INTRAMUSCULAR | Status: AC
  Filled 2017-12-23: qty 4

## 2017-12-23 MED ORDER — FENTANYL CITRATE (PF) 100 MCG/2ML IJ SOLN
INTRAMUSCULAR | Status: AC | PRN
  Administered 2017-12-23 (×2): 25 ug via INTRAVENOUS
  Administered 2017-12-23: 50 ug via INTRAVENOUS

## 2017-12-23 MED ORDER — LIDOCAINE HCL (PF) 1 % IJ SOLN
INTRAMUSCULAR | Status: AC
  Filled 2017-12-23: qty 30

## 2017-12-23 MED ORDER — MIDAZOLAM HCL 2 MG/2ML IJ SOLN
INTRAMUSCULAR | Status: AC
  Filled 2017-12-23: qty 4

## 2017-12-23 MED ORDER — GELATIN ABSORBABLE 12-7 MM EX MISC
CUTANEOUS | Status: AC
  Filled 2017-12-23: qty 1

## 2017-12-23 MED ORDER — SODIUM CHLORIDE 0.9 % IV SOLN: INTRAVENOUS | Status: DC

## 2017-12-23 MED ORDER — MIDAZOLAM HCL 2 MG/2ML IJ SOLN
INTRAMUSCULAR | Status: AC | PRN
  Administered 2017-12-23: 1 mg via INTRAVENOUS
  Administered 2017-12-23: 0.5 mg via INTRAVENOUS

## 2017-12-23 NOTE — Procedures (Signed)
Interventional Radiology Procedure Note  Procedure: US guided liver biopsy  Complications: None  Estimated Blood Loss: < 10 mL  Findings: 5 cm upper right lobe mass localized and sampled via 17 G needle under US guidance. 18 G core biopsy x 3. Gelfoam slurry injected on completion.  Venetia Night. Kathlene Cote, M.D Pager:  [phone]
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
## 2017-12-23 NOTE — Discharge Instructions (Addendum)
Liver Biopsy, Care After Refer to this sheet in the next few weeks. These instructions provide you with information on caring for yourself after your procedure. Your health care provider may also give you more specific instructions. Your treatment has been planned according to current medical practices, but problems sometimes occur. Call your health care provider if you have any problems or questions after your procedure. What can I expect after the procedure? After your procedure, it is typical to have the following:  A small amount of discomfort in the area where the biopsy was done and in the right shoulder or shoulder blade.  A small amount of bruising around the area where the biopsy was done and on the skin over the liver.  Sleepiness and fatigue for the rest of the day.  Follow these instructions at home:  Rest at home for 1-2 days or as directed by your health care provider.  Have a friend or family member stay with you for at least 24 hours.  Because of the medicines used during the procedure, you should not do the following things in the first 24 hours: ? Drive. ? Use machinery. ? Be responsible for the care of other people. ? Sign legal documents. ? Take a bath or shower.  There are many different ways to close and cover an incision, including stitches, skin glue, and adhesive strips. Follow your health care provider's instructions on: ? Incision care. ? Bandage (dressing) changes and removal. ? Incision closure removal.  Do not drink alcohol in the first week.  Do not lift more than 5 pounds or play contact sports for 2 weeks after this test.  Take medicines only as directed by your health care provider. Do not take medicine containing aspirin or non-steroidal anti-inflammatory medicines such as ibuprofen for 1 week after this test.  It is your responsibility to get your test results. Contact a health care provider if:  You have increased bleeding from an incision  that results in more than a small spot of blood.  You have redness, swelling, or increasing pain in any incisions.  You notice a discharge or a bad smell coming from any of your incisions.  You have a fever or chills. Get help right away if:  You develop swelling, bloating, or pain in your abdomen.  You become dizzy or faint.  You develop a rash.  You are nauseous or vomit.  You have difficulty breathing, feel short of breath, or feel faint.  You develop chest pain.  You have problems with your speech or vision.  You have trouble balancing or moving your arms or legs. This information is not intended to replace advice given to you by your health care provider. Make sure you discuss any questions you have with your health care provider. Document Released: 02/06/2005 Document Revised: 12/26/2015 Document Reviewed: 09/15/2013 Elsevier Interactive Patient Education  2018 [place]. Moderate Conscious Sedation, Adult, Care After These instructions provide you with information about caring for yourself after your procedure. Your health care provider may also give you more specific instructions. Your treatment has been planned according to current medical practices, but problems sometimes occur. Call your health care provider if you have any problems or questions after your procedure. What can I expect after the procedure? After your procedure, it is common:  To feel sleepy for several hours.  To feel clumsy and have poor balance for several hours.  To have poor judgment for several hours.  To vomit if you eat  too soon.  Follow these instructions at home: For at least 24 hours after the procedure:   Do not: ? Participate in activities where you could fall or become injured. ? Drive. ? Use heavy machinery. ? Drink alcohol. ? Take sleeping pills or medicines that cause drowsiness. ? Make important decisions or sign legal documents. ? Take care of children on your  own.  Rest. Eating and drinking  Follow the diet recommended by your health care provider.  If you vomit: ? Drink water, juice, or soup when you can drink without vomiting. ? Make sure you have little or no nausea before eating solid foods. General instructions  Have a responsible adult stay with you until you are awake and alert.  Take over-the-counter and prescription medicines only as told by your health care provider.  If you smoke, do not smoke without supervision.  Keep all follow-up visits as told by your health care provider. This is important. Contact a health care provider if:  You keep feeling nauseous or you keep vomiting.  You feel light-headed.  You develop a rash.  You have a fever. Get help right away if:  You have trouble breathing. This information is not intended to replace advice given to you by your health care provider. Make sure you discuss any questions you have with your health care provider. Document Released: 05/10/2013 Document Revised: 12/23/2015 Document Reviewed: 11/09/2015 Elsevier Interactive Patient Education  [employer].
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
## 2017-12-30 ENCOUNTER — Inpatient Hospital Stay (HOSPITAL_BASED_OUTPATIENT_CLINIC_OR_DEPARTMENT_OTHER): Payer: Medicare Other | Admitting: Hematology

## 2017-12-30 ENCOUNTER — Telehealth: Payer: Self-pay | Admitting: Hematology

## 2017-12-30 ENCOUNTER — Encounter: Payer: Self-pay | Admitting: Hematology

## 2017-12-30 VITALS — BP 140/90 | HR 88 | Temp 98.3°F | Resp 19 | Ht 67.0 in | Wt 169.5 lb

## 2017-12-30 DIAGNOSIS — I10 Essential (primary) hypertension: Secondary | ICD-10-CM

## 2017-12-30 DIAGNOSIS — F17210 Nicotine dependence, cigarettes, uncomplicated: Secondary | ICD-10-CM

## 2017-12-30 DIAGNOSIS — F1010 Alcohol abuse, uncomplicated: Secondary | ICD-10-CM

## 2017-12-30 DIAGNOSIS — C220 Liver cell carcinoma: Secondary | ICD-10-CM | POA: Diagnosis not present

## 2017-12-30 DIAGNOSIS — Z794 Long term (current) use of insulin: Secondary | ICD-10-CM

## 2017-12-30 DIAGNOSIS — Z79899 Other long term (current) drug therapy: Secondary | ICD-10-CM

## 2017-12-30 DIAGNOSIS — M549 Dorsalgia, unspecified: Secondary | ICD-10-CM

## 2017-12-30 DIAGNOSIS — E7800 Pure hypercholesterolemia, unspecified: Secondary | ICD-10-CM

## 2017-12-30 DIAGNOSIS — B182 Chronic viral hepatitis C: Secondary | ICD-10-CM | POA: Diagnosis not present

## 2017-12-30 DIAGNOSIS — K740 Hepatic fibrosis: Secondary | ICD-10-CM

## 2017-12-30 DIAGNOSIS — E119 Type 2 diabetes mellitus without complications: Secondary | ICD-10-CM | POA: Diagnosis not present

## 2017-12-30 DIAGNOSIS — R160 Hepatomegaly, not elsewhere classified: Secondary | ICD-10-CM | POA: Diagnosis not present

## 2017-12-30 NOTE — Telephone Encounter (Signed)
Appointments scheduled [facility]/Calendar printed per 5/30 los

## 2018-01-04 ENCOUNTER — Encounter: Payer: Self-pay | Admitting: Genetics

## 2018-01-05 ENCOUNTER — Encounter: Payer: Self-pay | Admitting: Family Medicine

## 2018-01-05 ENCOUNTER — Other Ambulatory Visit: Payer: Self-pay | Admitting: Family Medicine

## 2018-01-05 ENCOUNTER — Ambulatory Visit (INDEPENDENT_AMBULATORY_CARE_PROVIDER_SITE_OTHER): Payer: Medicare Other | Admitting: Family Medicine

## 2018-01-05 VITALS — BP 118/78 | HR 81 | Temp 98.5°F | Resp 18 | Ht 67.0 in | Wt 168.0 lb

## 2018-01-05 DIAGNOSIS — E785 Hyperlipidemia, unspecified: Secondary | ICD-10-CM | POA: Diagnosis not present

## 2018-01-05 DIAGNOSIS — E139 Other specified diabetes mellitus without complications: Secondary | ICD-10-CM

## 2018-01-05 DIAGNOSIS — I10 Essential (primary) hypertension: Secondary | ICD-10-CM

## 2018-01-05 DIAGNOSIS — E118 Type 2 diabetes mellitus with unspecified complications: Secondary | ICD-10-CM

## 2018-01-05 DIAGNOSIS — B182 Chronic viral hepatitis C: Secondary | ICD-10-CM

## 2018-01-05 DIAGNOSIS — G629 Polyneuropathy, unspecified: Secondary | ICD-10-CM

## 2018-01-05 DIAGNOSIS — Z23 Encounter for immunization: Secondary | ICD-10-CM

## 2018-01-05 DIAGNOSIS — Z794 Long term (current) use of insulin: Secondary | ICD-10-CM | POA: Diagnosis not present

## 2018-01-05 DIAGNOSIS — C220 Liver cell carcinoma: Secondary | ICD-10-CM | POA: Diagnosis not present

## 2018-01-05 DIAGNOSIS — F17200 Nicotine dependence, unspecified, uncomplicated: Secondary | ICD-10-CM | POA: Diagnosis not present

## 2018-01-05 LAB — POCT URINALYSIS DIPSTICK
Bilirubin, UA: NEGATIVE
Blood, UA: NEGATIVE
Glucose, UA: NEGATIVE
KETONES UA: NEGATIVE
LEUKOCYTES UA: NEGATIVE
NITRITE UA: NEGATIVE
PH UA: 5.5 (ref 5.0–8.0)
PROTEIN UA: NEGATIVE
Spec Grav, UA: 1.02 (ref 1.010–1.025)
UROBILINOGEN UA: 2 U/dL — AB

## 2018-01-05 LAB — GLUCOSE, POCT (MANUAL RESULT ENTRY): POC Glucose: 122 mg/dL — AB (ref 70–99)

## 2018-01-05 LAB — POCT GLYCOSYLATED HEMOGLOBIN (HGB A1C): Hemoglobin A1C: 7.2 % — AB (ref 4.0–5.6)

## 2018-01-05 MED ORDER — HYDROCHLOROTHIAZIDE 25 MG PO TABS: 25.0000 mg | ORAL_TABLET | Freq: Every day | ORAL | 1 refills | Status: DC

## 2018-01-05 MED ORDER — GLIPIZIDE 10 MG PO TABS: ORAL_TABLET | ORAL | 0 refills | Status: DC

## 2018-01-05 MED ORDER — LISINOPRIL 20 MG PO TABS: 20.0000 mg | ORAL_TABLET | Freq: Every day | ORAL | 1 refills | Status: DC

## 2018-01-05 MED ORDER — GABAPENTIN 300 MG PO CAPS: 300.0000 mg | ORAL_CAPSULE | Freq: Three times a day (TID) | ORAL | 1 refills | Status: DC

## 2018-01-05 MED ORDER — INSULIN GLARGINE 100 UNIT/ML ~~LOC~~ SOLN: 30.0000 [IU] | Freq: Every day | SUBCUTANEOUS | 11 refills | Status: DC

## 2018-01-05 NOTE — Progress Notes (Signed)
Subjective:    Patient ID: [name], male    DOB: 03-25-1966, 52 y.o.   MRN: [id number]   [name], a 52 year old male with a history of diabetes type 2, hypertension, and hyperlipidemia presents for a follow up of chronic conditions.  Patient has had frequent missed appointments over the past several months.  [name] has a history of hepatitis C and hepatocellular carcinoma. Patient was recently diagnosed with hepatocellular carcinoma. He established care with [facility], Dr. Burr Medico, oncologist. Patient will undergo chemotherapy and radiation. He says that he has not been scheduled due to his upcoming vacation from 6/9-6/13. Patient is concerned about starting treatment for hepatitis C. He was informed that hep C treatment options will more than likely be addresses following palliative treatment for carcinoma.   He has a history of type 2 diabetes. He was diagnosed with diabetes mellitus several years ago. He takes medications consistently, but does not exercise or follow a carbohydrate modified diet. He reports neuropathy.He says that he typically takes Lantus 40 units at bedtime. He endorses bilateral lower extremity neuropathy. He has been taking Gabapentin, with relief. He currently denies fatigue, polyuria, polydipsia, or polyphagia.   [name] also has a history of hypertension and hyperlipidemia. He is taking medications consistently. He does not follow a low fat, low sodium diet. He denies shortness of breath, chest pain, lower extremity edema, tachypnea, or syncope.     Past Medical History:  Diagnosis Date  . Diabetes mellitus   . Elevated cholesterol   . Hepatitis C   . Hypertension    Social History   Socioeconomic History  . Marital status: Single    Spouse name: Not on file  . Number of children: Not on file  . Years of education: Not on file  . Highest education level: Not on file  Occupational History  . Not on file  Social Needs  .  Financial resource strain: Not on file  . Food insecurity:    Worry: Not on file    Inability: Not on file  . Transportation needs:    Medical: Not on file    Non-medical: Not on file  Tobacco Use  . Smoking status: Current Every Day Smoker    Packs/day: 1.00    Years: 40.00    Pack years: 40.00    Types: Cigarettes  . Smokeless tobacco: Never Used  Substance and Sexual Activity  . Alcohol use: Yes    Alcohol/week: 24.0 - 24.6 oz    Types: 2 - 3 Glasses of wine, 24 Cans of beer, 14 Standard drinks or equivalent per week  . Drug use: No    Comment: history of iv drugs   . Sexual activity: Yes  Lifestyle  . Physical activity:    Days per week: Not on file    Minutes per session: Not on file  . Stress: Not on file  Relationships  . Social connections:    Talks on phone: Not on file    Gets together: Not on file    Attends religious service: Not on file    Active member of club or organization: Not on file    Attends meetings of clubs or organizations: Not on file    Relationship status: Not on file  . Intimate partner violence:    Fear of current or ex partner: Not on file    Emotionally abused: Not on file    Physically abused: Not on  file    Forced sexual activity: Not on file  Other Topics Concern  . Not on file  Social History Narrative  . Not on file   Immunization History  Administered Date(s) Administered  . Influenza,inj,Quad PF,6+ Mos 06/17/2017  . Td 06/27/2011    Review of Systems  Constitutional: Negative.  Negative for fatigue, fever and unexpected weight change.  HENT: Negative.   Eyes: Positive for redness.  Respiratory: Negative.  Negative for apnea, chest tightness and shortness of breath.   Cardiovascular: Negative.   Gastrointestinal: Negative.   Endocrine: Negative.  Negative for cold intolerance, heat intolerance, polydipsia, polyphagia and polyuria.  Genitourinary: Negative.   Musculoskeletal: Positive for arthralgias. Negative for neck  pain and neck stiffness.       Right hip pain  Skin: Negative.   Allergic/Immunologic: Negative.  Negative for environmental allergies, food allergies and immunocompromised state.  Neurological: Positive for numbness (Neuropathy to lower extremities).  Hematological: Negative.   Psychiatric/Behavioral: Negative.        Objective:   Physical Exam  Constitutional: He is oriented to person, place, and time.  HENT:  Head: Normocephalic and atraumatic.  Right Ear: External ear normal.  Left Ear: External ear normal.  Nose: Nose normal.  Mouth/Throat: Oropharynx is clear and moist.  Eyes: Pupils are equal, round, and reactive to light. Conjunctivae and EOM are normal.  Neck: Normal range of motion. Neck supple.  Cardiovascular: Normal rate, regular rhythm, normal heart sounds and intact distal pulses.  Pulmonary/Chest: Effort normal and breath sounds normal.  Abdominal: Soft. Bowel sounds are normal.  Musculoskeletal:       Right hip: He exhibits decreased range of motion and decreased strength.  Neurological: He is alert and oriented to person, place, and time. He has normal reflexes.  Skin: Skin is warm and dry.  Skin graft to left forearm  Psychiatric: He has a normal mood and affect. His behavior is normal. Judgment and thought content normal.      BP 118/78 (BP Location: Left Arm, Patient Position: Sitting, Cuff Size: Normal)   Pulse 81   Temp 98.5 F (36.9 C) (Oral)   Resp 18   Ht 5' 7"  (1.702 m)   Wt 168 lb (76.2 kg)   SpO2 100%   BMI 26.31 kg/m   Assessment & Plan:  1. Type 2 diabetes mellitus with complication, with long-term current use of insulin (HCC) Hemoglobin a1C has improved to 7.2, will lower Lantus to 30 units at bedtime. Continue Metformin 1000 mg BID.  Advised to continue carbohydrate modified diet divided over 5-6 small meals throughout the day.  - HgB A1c - Urinalysis Dipstick - Glucose (CBG) - insulin glargine (LANTUS) 100 UNIT/ML injection; Inject  0.3 mLs (30 Units total) into the skin at bedtime.  Dispense: 10 mL; Refill: 11  2. Immunization due - Pneumococcal polysaccharide vaccine 23-valent greater than or equal to 2yo subcutaneous/IM  3. Hyperlipidemia, unspecified hyperlipidemia type Continue pravastatin as previously prescribed.  The 10-year ASCVD risk score Mikey Bussing DC Brooke Bonito., et al., 2013) is: 26.2%   Values used to calculate the score:     Age: 74 years     Sex: Male     Is Non-Hispanic African American: Yes     Diabetic: Yes     Tobacco smoker: Yes     Systolic Blood Pressure: 517 mmHg     Is BP treated: Yes     HDL Cholesterol: 38 mg/dL     Total Cholesterol: 186 mg/dL  4. Tobacco dependence Discussed smoking cessation at length. Patient in pre contemplative state.   5. Neuropathy - gabapentin (NEURONTIN) 300 MG capsule; Take 1 capsule (300 mg total) by mouth 3 (three) times daily.  Dispense: 270 capsule; Refill: 1  6. Essential hypertension Blood pressure is at goal on current medication regimen. No changes warranted on today.  We have discussed target BP range and blood pressure goal. I have advised patient to check BP regularly and to call us back or report to clinic if the numbers are consistently higher than 140/90. We discussed the importance of compliance with medical therapy and DASH diet recommended, consequences of uncontrolled hypertension discussed.  - continue current BP medications  - lisinopril (PRINIVIL,ZESTRIL) 20 MG tablet; Take 1 tablet (20 mg total) by mouth daily.  Dispense: 90 tablet; Refill: 1 - hydrochlorothiazide (HYDRODIURIL) 25 MG tablet; Take 1 tablet (25 mg total) by mouth daily.  Dispense: 90 tablet; Refill: 1  7. Hepatocellular carcinoma ([place]) Follow up with Dr. Burr Medico as scheduled. Patient to be scheduled for initiation of chemotherapy  8. Chronic hepatitis C without hepatic coma ([place]) Patient advised to follow up with Dr. Linus Salmons.  Epclusa previously held   RTC: 3 months for chronic  conditions   Donia Pounds  MSN, FNP-C [facility] [address] [place], [place] [address] [phone]

## 2018-01-05 NOTE — Patient Instructions (Signed)
Your hemoglobin A1c is 7.2, which is improving.  Will decrease Lantus to 30 units at bedtime.  We will continue metformin 1000 mg twice daily and glipizide 10 mg daily. Your blood pressure is at goal on current medication regimen, no changes warranted on today.  We will send all medications electronically to your pharmacy. - Continue medication, monitor blood pressure at home. Continue DASH diet. Reminder to go to the ER if any CP, SOB, nausea, dizziness, severe HA, changes vision/speech, left arm numbness and tingling and jaw pain.   Continue to follow-up with oncology as scheduled.   Follow-up in office if any problem occurs. I recommend that she maintain a balanced diet and recommend smoking cessation.

## 2018-01-07 ENCOUNTER — Telehealth: Payer: Self-pay

## 2018-01-07 ENCOUNTER — Other Ambulatory Visit: Payer: Self-pay | Admitting: Family Medicine

## 2018-01-07 MED ORDER — ONDANSETRON HCL 4 MG PO TABS: 4.0000 mg | ORAL_TABLET | Freq: Three times a day (TID) | ORAL | 0 refills | Status: DC | PRN

## 2018-01-07 NOTE — Telephone Encounter (Signed)
[place], Please advise. I don't see any nausea medication on list. Thanks!

## 2018-01-07 NOTE — Progress Notes (Signed)
Meds ordered this encounter  Medications  . ondansetron (ZOFRAN) 4 MG tablet    Sig: Take 1 tablet (4 mg total) by mouth every 8 (eight) hours as needed for nausea or vomiting.    Dispense:  30 tablet    Refill:  0     Donia Pounds  MSN, FNP-C [facility] [address] [place], [place] [address] [phone]

## 2018-02-01 ENCOUNTER — Telehealth: Payer: Self-pay

## 2018-02-01 NOTE — Telephone Encounter (Signed)
Patient's significant other called to follow up on a referral Dr. Burr Medico was making for an IR Procedure.  [phone]

## 2018-02-02 ENCOUNTER — Telehealth: Payer: Self-pay | Admitting: *Deleted

## 2018-02-02 ENCOUNTER — Other Ambulatory Visit: Payer: Self-pay | Admitting: Hematology

## 2018-02-02 ENCOUNTER — Telehealth: Payer: Self-pay

## 2018-02-02 DIAGNOSIS — C220 Liver cell carcinoma: Secondary | ICD-10-CM

## 2018-02-02 NOTE — Telephone Encounter (Signed)
Called pt to sch consult referred by Dr. Burr Medico. Lmom Hm and Cell./vm

## 2018-02-02 NOTE — Telephone Encounter (Signed)
Left voice message per Dr. Burr Medico notified patient she has contact IR about this procedure and someone should be calling him soon with an appointment.

## 2018-02-09 ENCOUNTER — Other Ambulatory Visit: Payer: Self-pay

## 2018-02-16 ENCOUNTER — Ambulatory Visit
Admission: RE | Admit: 2018-02-16 | Discharge: 2018-02-16 | Disposition: A | Discharge status: Home / Self Care | Payer: Medicare Other | Source: Ambulatory Visit | Attending: Hematology | Admitting: Hematology

## 2018-02-16 ENCOUNTER — Other Ambulatory Visit (HOSPITAL_COMMUNITY): Payer: Self-pay | Admitting: Interventional Radiology

## 2018-02-16 ENCOUNTER — Other Ambulatory Visit: Payer: Self-pay | Admitting: Internal Medicine

## 2018-02-16 ENCOUNTER — Telehealth: Payer: Self-pay | Admitting: *Deleted

## 2018-02-16 ENCOUNTER — Encounter: Payer: Self-pay | Admitting: *Deleted

## 2018-02-16 ENCOUNTER — Other Ambulatory Visit: Payer: Self-pay | Admitting: *Deleted

## 2018-02-16 DIAGNOSIS — C220 Liver cell carcinoma: Secondary | ICD-10-CM

## 2018-02-16 HISTORY — PX: IR RADIOLOGIST EVAL & MGMT: IMG5224

## 2018-02-16 NOTE — Progress Notes (Signed)
Chief Complaint: Patient was seen in consultation today for hepatocellular carcinoma at the request of Feng,Yan  Referring Physician(s): Feng,Yan  History of Present Illness: [name] is a 52 y.o. male who is followed by Dr. Linus Salmons for chronic hepatitis C infection.  Ultrasound of the abdomen on 12/07/2017 demonstrated a 6 cm mass in the right lobe of the liver.  Further characterization was performed by MRI demonstrating a segment 8 mass measuring approximately 5.5 cm in greatest diameter as well as multiple additional similar smaller masses in both left and right lobes of the liver.  PET scan demonstrated hypermetabolic activity in the dominant tumor mass and no evidence of extrahepatic metastatic disease.  Biopsy of the dominant hepatic mass was performed on 12/23/2017 demonstrating hepatocellular carcinoma.  AFP is mildly elevated at 12.0.  Liver function tests show elevation of AST and ALT with normal bilirubin.  INR is normal.  Renal function is normal.  The patient saw Dr. Burr Medico at the [facility] who also recommended IR evaluation for discussion of possible liver directed transcatheter therapies.  Due to some mixup in how the referral was placed electronically, our office was not aware of the referral for consultation until recently. The patient has not received any treatment for [facility] and has also not yet undergone any antiviral treatment for hepatitis C.   The patient is fatigued and complains of periodic nausea and loss of appetite.  He has lost some weight.  He denies any significant abdominal pain and states that he has some mild abdominal discomfort occasionally.  He continues to drink alcohol and admits to drinking approximately ten 40 oz beers per week.  He is smoking approximately 2 to 20 cigarettes/day.  Past Medical History:  Diagnosis Date  . Diabetes mellitus   . Elevated cholesterol   . Hepatitis C   . Hypertension     Past Surgical History:  Procedure Laterality  Date  . I&D EXTREMITY  06/28/2011   Procedure: IRRIGATION AND DEBRIDEMENT EXTREMITY;  Surgeon: Linna Hoff;  Location: [place];  Service: Orthopedics;  Laterality: Left;  with application of wound vac  . INCISION AND DRAINAGE OF WOUND  07/02/2011   Procedure: IRRIGATION AND DEBRIDEMENT WOUND;  Surgeon: Linna Hoff;  Location: [place];  Service: Orthopedics;  Laterality: Left;  Irrigation and Debridement of left arm wound with  Skin Grafting from left thigh   . IR RADIOLOGIST EVAL & MGMT  02/16/2018  . JOINT REPLACEMENT    . PARTIAL HIP ARTHROPLASTY      Allergies: Gadolinium derivatives and Multihance [gadobenate]  Medications: Prior to Admission medications   Medication Sig Start Date End Date Taking? Authorizing Provider  cyclobenzaprine (FLEXERIL) 5 MG tablet Take 1-2 tablets (5-10 mg total) by mouth 3 (three) times daily as needed for muscle spasms. 12/14/17  Yes Truitt Merle, MD  gabapentin (NEURONTIN) 300 MG capsule Take 1 capsule (300 mg total) by mouth 3 (three) times daily. 01/05/18  Yes Dorena Dew, FNP  glipiZIDE (GLUCOTROL) 10 MG tablet TAKE 1 TABLET(10 MG) BY MOUTH TWICE DAILY BEFORE A MEAL 01/05/18  Yes Cammie Sickle M, FNP  glucose blood test strip Check blood sugar 3 times per day prior to meals. Use as instructed 12/22/17  Yes Dorena Dew, FNP  hydrochlorothiazide (HYDRODIURIL) 25 MG tablet Take 1 tablet (25 mg total) by mouth daily. 01/05/18  Yes Dorena Dew, FNP  insulin glargine (LANTUS) 100 UNIT/ML injection Inject 0.3 mLs (30 Units total) into the  skin at bedtime. 01/05/18  Yes Dorena Dew, FNP  lisinopril (PRINIVIL,ZESTRIL) 20 MG tablet Take 1 tablet (20 mg total) by mouth daily. 01/05/18  Yes Dorena Dew, FNP  metFORMIN (GLUCOPHAGE) 1000 MG tablet Take 1 tablet (1,000 mg total) 2 (two) times daily by mouth. 06/17/17  Yes Dorena Dew, FNP  ondansetron (ZOFRAN) 4 MG tablet Take 1 tablet (4 mg total) by mouth every 8 (eight) hours as needed for  nausea or vomiting. 01/07/18  Yes Dorena Dew, FNP  pravastatin (PRAVACHOL) 80 MG tablet Take 1 tablet (80 mg total) daily by mouth. 06/17/17  Yes Dorena Dew, FNP  senna (SENOKOT) 8.6 MG tablet Take 1 tablet by mouth as needed for constipation.   Yes [provider]  traMADol (ULTRAM) 50 MG tablet Take 1 tablet (50 mg total) by mouth every 6 (six) hours as needed. 12/11/17  Yes Kinnie Feil, MD  Sofosbuvir-Velpatasvir (EPCLUSA) 400-100 MG TABS Take 1 tablet by mouth daily. Patient not taking: Reported on 02/16/2018 11/16/17   Thayer Headings, MD     Family History  Problem Relation Age of Onset  . Diabetes Unknown   . Hypertension Unknown   . Diabetes Mother   . Liver disease Father     Social History   Socioeconomic History  . Marital status: Single    Spouse name: Not on file  . Number of children: Not on file  . Years of education: Not on file  . Highest education level: Not on file  Occupational History  . Not on file  Social Needs  . Financial resource strain: Not on file  . Food insecurity:    Worry: Not on file    Inability: Not on file  . Transportation needs:    Medical: Not on file    Non-medical: Not on file  Tobacco Use  . Smoking status: Current Every Day Smoker    Packs/day: 1.00    Years: 40.00    Pack years: 40.00    Types: Cigarettes  . Smokeless tobacco: Never Used  Substance and Sexual Activity  . Alcohol use: Yes    Alcohol/week: 24.0 - 24.6 oz    Types: 2 - 3 Glasses of wine, 24 Cans of beer, 14 Standard drinks or equivalent per week  . Drug use: No    Comment: history of iv drugs   . Sexual activity: Yes  Lifestyle  . Physical activity:    Days per week: Not on file    Minutes per session: Not on file  . Stress: Not on file  Relationships  . Social connections:    Talks on phone: Not on file    Gets together: Not on file    Attends religious service: Not on file    Active member of club or organization: Not on file      Attends meetings of clubs or organizations: Not on file    Relationship status: Not on file  Other Topics Concern  . Not on file  Social History Narrative  . Not on file    ECOG Status: 1 - Symptomatic but completely ambulatory  Review of Systems: A 12 point ROS discussed and pertinent positives are indicated in the HPI above.  All other systems are negative.  Review of Systems  Constitutional: Positive for activity change, appetite change, fatigue and unexpected weight change.  HENT: Negative.   Respiratory: Negative.   Gastrointestinal: Positive for nausea. Negative for blood in stool and vomiting.  Genitourinary: Negative.   Musculoskeletal: Negative.   Neurological: Negative.     Vital Signs: BP 117/73   Pulse 87   Temp 98.5 F (36.9 C) (Oral)   Resp 15   Ht 5' 7"  (1.702 m)   Wt 165 lb (74.8 kg)   SpO2 98%   BMI 25.84 kg/m   Physical Exam  Constitutional: He is oriented to person, place, and time. No distress.  HENT:  Head: Normocephalic and atraumatic.  Eyes: No scleral icterus.  Neck: Neck supple. No JVD present.  Cardiovascular: Normal rate, regular rhythm and normal heart sounds. Exam reveals no gallop and no friction rub.  No murmur heard. Pulmonary/Chest: Effort normal and breath sounds normal. No stridor. No respiratory distress. He has no wheezes. He has no rales.  Abdominal: Soft. Bowel sounds are normal. He exhibits no distension and no mass. There is no tenderness. There is no rebound and no guarding.  Musculoskeletal: He exhibits no edema.  Lymphadenopathy:    He has no cervical adenopathy.  Neurological: He is alert and oriented to person, place, and time.  Skin: Skin is warm and dry. He is not diaphoretic.  No jaundice.  Vitals reviewed.   Imaging: Ir Radiologist Eval & Mgmt  Result Date: 02/16/2018 Please refer to notes tab for details about interventional procedure. (Op Note)   Labs:  CBC: Recent Labs    06/17/17 1549  09/23/17 1122 12/09/17 1357 12/23/17 0645  WBC 5.1 5.5 6.1 6.5  HGB 14.1 14.7 13.4 13.2  HCT 41.7 42.4 38.7 39.9  PLT 279 302 295 327    COAGS: Recent Labs    09/23/17 1122 12/23/17 0645  INR 1.0 1.01  APTT  --  33    BMP: Recent Labs    06/17/17 1549 09/23/17 1122 12/09/17 1357  NA 140 142 138  K 4.3 4.7 4.4  CL 105 106 105  CO2 28 28 27   GLUCOSE 243* 136* 202*  BUN 18 21 6*  CALCIUM 9.5 9.8 9.4  CREATININE 1.37* 1.24 1.10  GFRNONAA 59* 67 77  GFRAA 69 78 90    LIVER FUNCTION TESTS: Recent Labs    06/17/17 1549 09/23/17 1122 09/23/17 1125 12/09/17 1357  BILITOT 0.4 0.4  --  0.6  AST 51* 52*  --  92*  ALT 68* 80* 78* 93*  PROT 6.9 6.7  --  6.7    TUMOR MARKERS: Recent Labs    12/09/17 1357  AFPTM 12.0*    Assessment and Plan:  I met with [name] and his fiance.  We reviewed imaging studies performed previously in May.  A dominant 5.5 cm central right lobe mass extending up to the dome.  At the time of MRI, he did have an allergic reaction after administration of IV gadolinium with diffuse itching and hives.  This was treated with Benadryl.  We discussed potential liver directed transcatheter therapies including yttrium-90 radioembolization and transcatheter chemoembolization with drug-eluting beads.  The advantage of Y-90 treatment is the ability for staged right and left lobe treatments to be performed.  The disadvantage is the length of time it takes to perform the initial planning arteriography with shunt calculation followed by right lobe treatment 2 weeks later and possible left lobe treatment as soon as 1 month after right lobe treatment.  The advantage of chemoembolization is that it is a single stage procedure that can be performed more quickly and could be initially used to target the dominant right lobe tumor mass given that  there has been some delay in consultation since his diagnosis.  Before deciding on treatment, the patient and his  fiance are scheduled to leave on vacation from August 4 until August 10.  They do not wish to undergo any treatments before then.  In the meantime, I did suggest additional imaging currently to reassess the extent and size of tumor.  Given prior allergic reaction to gadolinium, I recommended a multiphase CT of the abdomen with iodinated contrast.    [name] is also very anxious to start antiviral therapy for his hepatitis C infection.  Given that he will not undergo any tumor treatment until at least mid to late August, I told him that we will try to contact Dr. Henreitta Leber office to determine whether he needs to be seen to start antiviral treatment.  A CT of the abdomen has been scheduled this week.  Thank you for this interesting consult.  I greatly enjoyed meeting [name] and look forward to participating in their care.  A copy of this report was sent to the requesting provider on this date.  Electronically SignedAletta Edouard T 02/16/2018, 11:35 AM     I spent a total of 40 Minutes  in face to face in clinical consultation, greater than 50% of which was counseling/coordinating care for treatment of hepatocellular carcinoma.

## 2018-02-16 NOTE — Telephone Encounter (Signed)
RN called patient at the request of Dr Margaretmary Dys nurse Henri. Patient is nervous about his hepatitis c, wants this cured before treatment for hepatocarcinoma. RN spoke with pharmacy to confirm Dr Comer's advice to postpone hepatitis c treatment until after cancer treatment.  Per pharmacy, the cure rate is less than 50% while patient has cancer. RN relayed this to patient's fiance, explained that Dr Linus Salmons is still planning to see and treat him once he is cleared by oncology. Landis Gandy, RN

## 2018-02-17 ENCOUNTER — Ambulatory Visit (HOSPITAL_COMMUNITY)
Admission: RE | Admit: 2018-02-17 | Discharge: 2018-02-17 | Disposition: A | Discharge status: Home / Self Care | Payer: Medicare Other | Source: Ambulatory Visit | Attending: Interventional Radiology | Admitting: Interventional Radiology

## 2018-02-17 DIAGNOSIS — C229 Malignant neoplasm of liver, not specified as primary or secondary: Secondary | ICD-10-CM | POA: Diagnosis not present

## 2018-02-17 DIAGNOSIS — K8020 Calculus of gallbladder without cholecystitis without obstruction: Secondary | ICD-10-CM | POA: Diagnosis not present

## 2018-02-17 DIAGNOSIS — I700 Atherosclerosis of aorta: Secondary | ICD-10-CM | POA: Insufficient documentation

## 2018-02-17 DIAGNOSIS — K760 Fatty (change of) liver, not elsewhere classified: Secondary | ICD-10-CM | POA: Diagnosis not present

## 2018-02-17 DIAGNOSIS — C220 Liver cell carcinoma: Secondary | ICD-10-CM | POA: Insufficient documentation

## 2018-02-17 LAB — POCT I-STAT CREATININE: Creatinine, Ser: 1.3 mg/dL — ABNORMAL HIGH (ref 0.61–1.24)

## 2018-02-17 MED ORDER — IOPAMIDOL (ISOVUE-300) INJECTION 61%
INTRAVENOUS | Status: AC
  Filled 2018-02-17: qty 100

## 2018-02-17 MED ORDER — IOPAMIDOL (ISOVUE-300) INJECTION 61%
100.0000 mL | Freq: Once | INTRAVENOUS | Status: AC | PRN
  Administered 2018-02-17: 100 mL via INTRAVENOUS

## 2018-02-23 ENCOUNTER — Other Ambulatory Visit (HOSPITAL_COMMUNITY): Payer: Self-pay | Admitting: Interventional Radiology

## 2018-02-23 DIAGNOSIS — C220 Liver cell carcinoma: Secondary | ICD-10-CM

## 2018-02-27 ENCOUNTER — Other Ambulatory Visit: Payer: Self-pay | Admitting: Physician Assistant

## 2018-02-27 ENCOUNTER — Ambulatory Visit (HOSPITAL_COMMUNITY)
Admission: EM | Admit: 2018-02-27 | Discharge: 2018-02-27 | Disposition: A | Discharge status: Home / Self Care | Payer: Medicare Other | Attending: Physician Assistant | Admitting: Physician Assistant

## 2018-02-27 ENCOUNTER — Encounter (HOSPITAL_COMMUNITY): Payer: Self-pay | Admitting: Emergency Medicine

## 2018-02-27 DIAGNOSIS — K089 Disorder of teeth and supporting structures, unspecified: Secondary | ICD-10-CM

## 2018-02-27 DIAGNOSIS — K05219 Aggressive periodontitis, localized, unspecified severity: Secondary | ICD-10-CM | POA: Diagnosis not present

## 2018-02-27 MED ORDER — PENICILLIN V POTASSIUM 500 MG PO TABS: 500.0000 mg | ORAL_TABLET | Freq: Three times a day (TID) | ORAL | 0 refills | Status: DC

## 2018-02-27 MED ORDER — NAPROXEN 375 MG PO TABS: 375.0000 mg | ORAL_TABLET | Freq: Two times a day (BID) | ORAL | 0 refills | Status: DC

## 2018-02-27 NOTE — Discharge Instructions (Signed)
Please see a dentist asap

## 2018-02-27 NOTE — ED Provider Notes (Signed)
02/27/2018 1:07 PM   DOB: October 09, 1965 / MRN: [id number]  SUBJECTIVE:  [name] is a 52 y.o. male presenting for left lower posterior teeth pain x 4 days and worsening.  Pain radiates to the left ear. Historically poor dentition with history of liver cancer 2/2 Hep C.   He is allergic to gadolinium derivatives and multihance [gadobenate].   He  has a past medical history of Diabetes mellitus, Elevated cholesterol, Hepatitis C, and Hypertension.    He  reports that he has been smoking cigarettes.  He has a 40.00 pack-year smoking history. He has never used smokeless tobacco. He reports that he drinks about 24.0 - 24.6 oz of alcohol per week. He reports that he does not use drugs. He  reports that he currently engages in sexual activity. The patient  has a past surgical history that includes Partial hip arthroplasty; I&D extremity (06/28/2011); Incision and drainage of wound (07/02/2011); Joint replacement; and IR Radiologist Eval & Mgmt (02/16/2018).  His family history includes Diabetes in his mother and unknown relative; Hypertension in his unknown relative; Liver disease in his father.  Review of Systems  Constitutional: Positive for fever.  Genitourinary: Negative for dysuria.  Skin: Negative for itching and rash.  Neurological: Negative for dizziness.    OBJECTIVE:  BP 134/80   Pulse 83   Temp 98.1 F (36.7 C)   Resp 18   SpO2 100%   Wt Readings from Last 3 Encounters:  02/16/18 165 lb (74.8 kg)  01/05/18 168 lb (76.2 kg)  12/30/17 169 lb 8 oz (76.9 kg)   Temp Readings from Last 3 Encounters:  02/27/18 98.1 F (36.7 C)  02/16/18 98.5 F (36.9 C) (Oral)  01/05/18 98.5 F (36.9 C) (Oral)   BP Readings from Last 3 Encounters:  02/27/18 134/80  02/16/18 117/73  01/05/18 118/78   Pulse Readings from Last 3 Encounters:  02/27/18 83  02/16/18 87  01/05/18 81    Physical Exam  Constitutional: He is oriented to person, place, and time. He appears well-developed. He does  not appear ill.  HENT:  Mouth/Throat:    Eyes: Pupils are equal, round, and reactive to light. Conjunctivae and EOM are normal.  Cardiovascular: Normal rate.  Pulmonary/Chest: Effort normal.  Abdominal: He exhibits no distension.  Musculoskeletal: Normal range of motion.  Neurological: He is alert and oriented to person, place, and time. No cranial nerve deficit. Coordination normal.  Skin: Skin is warm and dry. He is not diaphoretic.  Psychiatric: He has a normal mood and affect.  Nursing note and vitals reviewed.   Lab Results  Component Value Date   CREATININE 1.30 (H) 02/17/2018   Lab Results  Component Value Date   ALT 93 (H) 12/09/2017   AST 92 (H) 12/09/2017   ALKPHOS 52 12/01/2016   BILITOT 0.6 12/09/2017      No results found for this or any previous visit (from the past 72 hour(s)).  No results found.  ASSESSMENT AND PLAN:   Gingival abscess  Poor dentition    Discharge Instructions     Please see a dentist asap         The patient is advised to call or return to clinic if he does not see an improvement in symptoms, or to seek the care of the closest emergency department if he worsens with the above plan.   Philis Fendt, MHS, PA-C 02/27/2018 1:07 PM   Tereasa Coop, PA-C 02/27/18 1307

## 2018-02-27 NOTE — ED Triage Notes (Signed)
Pt c/o L upper tooth pain, swelling, states "I think theres and abscess". Pt also states his L ear also hurts.

## 2018-03-01 ENCOUNTER — Telehealth: Payer: Self-pay | Admitting: Family Medicine

## 2018-03-01 NOTE — Telephone Encounter (Signed)
Pt request refill Tramadol. States needs it today [facility]. Pt states will also have surgery on 03/18/18.

## 2018-03-02 NOTE — Telephone Encounter (Signed)
Left a vm for patient to callback 

## 2018-03-03 NOTE — Telephone Encounter (Signed)
Patient will wait until September to discuss medication at his appointment

## 2018-03-03 NOTE — Telephone Encounter (Signed)
Tried to contact patient no answer

## 2018-03-13 ENCOUNTER — Other Ambulatory Visit: Payer: Self-pay | Admitting: Family Medicine

## 2018-03-13 DIAGNOSIS — Z794 Long term (current) use of insulin: Principal | ICD-10-CM

## 2018-03-13 DIAGNOSIS — E118 Type 2 diabetes mellitus with unspecified complications: Secondary | ICD-10-CM

## 2018-03-14 ENCOUNTER — Other Ambulatory Visit: Payer: Self-pay | Admitting: Family Medicine

## 2018-03-14 DIAGNOSIS — E118 Type 2 diabetes mellitus with unspecified complications: Secondary | ICD-10-CM

## 2018-03-14 DIAGNOSIS — Z794 Long term (current) use of insulin: Principal | ICD-10-CM

## 2018-03-15 MED ORDER — LC BEADS 100-300UM IN SALINE: 150.0000 mg | Freq: Once | Status: DC

## 2018-03-16 MED ORDER — LC BEADS 100-300UM IN SALINE
150.0000 mg | Freq: Once | Status: AC
  Administered 2018-03-18: 150 mg via INTRA_ARTERIAL
  Filled 2018-03-16: qty 50

## 2018-03-17 ENCOUNTER — Other Ambulatory Visit: Payer: Self-pay | Admitting: Physician Assistant

## 2018-03-18 ENCOUNTER — Ambulatory Visit (HOSPITAL_COMMUNITY)
Admission: RE | Admit: 2018-03-18 | Discharge: 2018-03-18 | Disposition: A | Discharge status: Home / Self Care | Payer: Medicare Other | Source: Ambulatory Visit | Attending: Interventional Radiology | Admitting: Interventional Radiology

## 2018-03-18 ENCOUNTER — Observation Stay (HOSPITAL_COMMUNITY)
Admission: RE | Admit: 2018-03-18 | Discharge: 2018-03-19 | Disposition: A | Discharge status: Home / Self Care | Payer: Medicare Other | Source: Ambulatory Visit | Attending: Interventional Radiology | Admitting: Interventional Radiology

## 2018-03-18 ENCOUNTER — Other Ambulatory Visit (HOSPITAL_COMMUNITY): Payer: Self-pay | Admitting: Interventional Radiology

## 2018-03-18 ENCOUNTER — Other Ambulatory Visit: Payer: Self-pay

## 2018-03-18 ENCOUNTER — Encounter (HOSPITAL_COMMUNITY): Payer: Self-pay

## 2018-03-18 ENCOUNTER — Other Ambulatory Visit: Payer: Self-pay | Admitting: Radiology

## 2018-03-18 DIAGNOSIS — I10 Essential (primary) hypertension: Secondary | ICD-10-CM | POA: Diagnosis not present

## 2018-03-18 DIAGNOSIS — B1920 Unspecified viral hepatitis C without hepatic coma: Secondary | ICD-10-CM | POA: Insufficient documentation

## 2018-03-18 DIAGNOSIS — C220 Liver cell carcinoma: Secondary | ICD-10-CM

## 2018-03-18 DIAGNOSIS — Z794 Long term (current) use of insulin: Secondary | ICD-10-CM | POA: Diagnosis not present

## 2018-03-18 DIAGNOSIS — E7800 Pure hypercholesterolemia, unspecified: Secondary | ICD-10-CM | POA: Diagnosis not present

## 2018-03-18 DIAGNOSIS — F17210 Nicotine dependence, cigarettes, uncomplicated: Secondary | ICD-10-CM | POA: Diagnosis not present

## 2018-03-18 DIAGNOSIS — E119 Type 2 diabetes mellitus without complications: Secondary | ICD-10-CM | POA: Diagnosis not present

## 2018-03-18 DIAGNOSIS — Z91041 Radiographic dye allergy status: Secondary | ICD-10-CM | POA: Insufficient documentation

## 2018-03-18 HISTORY — PX: IR US GUIDE VASC ACCESS RIGHT: IMG2390

## 2018-03-18 HISTORY — PX: IR ANGIOGRAM SELECTIVE EACH ADDITIONAL VESSEL: IMG667

## 2018-03-18 HISTORY — PX: IR ANGIOGRAM VISCERAL SELECTIVE: IMG657

## 2018-03-18 HISTORY — PX: IR EMBO TUMOR ORGAN ISCHEMIA INFARCT INC GUIDE ROADMAPPING: IMG5449

## 2018-03-18 LAB — COMPREHENSIVE METABOLIC PANEL
ALBUMIN: 4.1 g/dL (ref 3.5–5.0)
ALT: 95 U/L — ABNORMAL HIGH (ref 0–44)
AST: 65 U/L — ABNORMAL HIGH (ref 15–41)
Alkaline Phosphatase: 51 U/L (ref 38–126)
Anion gap: 11 (ref 5–15)
BUN: 17 mg/dL (ref 6–20)
CHLORIDE: 105 mmol/L (ref 98–111)
CO2: 27 mmol/L (ref 22–32)
CREATININE: 1.26 mg/dL — AB (ref 0.61–1.24)
Calcium: 9.8 mg/dL (ref 8.9–10.3)
GFR calc Af Amer: >60 mL/min (ref >60)
GFR calc non Af Amer: >60 mL/min (ref >60)
Glucose, Bld: 143 mg/dL — ABNORMAL HIGH (ref 70–99)
Potassium: 4.2 mmol/L (ref 3.5–5.1)
SODIUM: 143 mmol/L (ref 135–145)
Total Bilirubin: 0.6 mg/dL (ref 0.3–1.2)
Total Protein: 7.7 g/dL (ref 6.5–8.1)

## 2018-03-18 LAB — PROTIME-INR
INR: 0.93
Prothrombin Time: 12.4 seconds (ref 11.4–15.2)

## 2018-03-18 LAB — CBC
HCT: 45.4 % (ref 39.0–52.0)
HEMOGLOBIN: 15.3 g/dL (ref 13.0–17.0)
MCH: 29.5 pg (ref 26.0–34.0)
MCHC: 33.7 g/dL (ref 30.0–36.0)
MCV: 87.6 fL (ref 78.0–100.0)
Platelets: 334 10*3/uL (ref 150–400)
RBC: 5.18 MIL/uL (ref 4.22–5.81)
RDW: 13 % (ref 11.5–15.5)
WBC: 6.4 10*3/uL (ref 4.0–10.5)

## 2018-03-18 LAB — GLUCOSE, CAPILLARY
GLUCOSE-CAPILLARY: 406 mg/dL — AB (ref 70–99)
GLUCOSE-CAPILLARY: 286 mg/dL — AB (ref 70–99)
Glucose-Capillary: 134 mg/dL — ABNORMAL HIGH (ref 70–99)
Glucose-Capillary: 258 mg/dL — ABNORMAL HIGH (ref 70–99)

## 2018-03-18 LAB — APTT: APTT: 32 s (ref 24–36)

## 2018-03-18 MED ORDER — SODIUM CHLORIDE 0.9 % IV SOLN
8.0000 mg | Freq: Once | INTRAVENOUS | Status: AC
  Administered 2018-03-18: 8 mg via INTRAVENOUS
  Filled 2018-03-18: qty 4

## 2018-03-18 MED ORDER — INSULIN ASPART 100 UNIT/ML ~~LOC~~ SOLN
15.0000 [IU] | Freq: Once | SUBCUTANEOUS | Status: AC
  Administered 2018-03-18: 15 [IU] via SUBCUTANEOUS

## 2018-03-18 MED ORDER — INSULIN GLARGINE 100 UNIT/ML ~~LOC~~ SOLN
30.0000 [IU] | Freq: Every day | SUBCUTANEOUS | Status: DC
  Administered 2018-03-18: 30 [IU] via SUBCUTANEOUS
  Filled 2018-03-18: qty 0.3

## 2018-03-18 MED ORDER — DEXAMETHASONE SODIUM PHOSPHATE 10 MG/ML IJ SOLN
8.0000 mg | Freq: Once | INTRAMUSCULAR | Status: AC
  Administered 2018-03-18: 8 mg via INTRAVENOUS
  Filled 2018-03-18: qty 1

## 2018-03-18 MED ORDER — PROMETHAZINE HCL 25 MG RE SUPP: 25.0000 mg | Freq: Three times a day (TID) | RECTAL | Status: DC | PRN

## 2018-03-18 MED ORDER — ONDANSETRON HCL 4 MG/2ML IJ SOLN: 4.0000 mg | Freq: Four times a day (QID) | INTRAMUSCULAR | Status: DC | PRN

## 2018-03-18 MED ORDER — DOCUSATE SODIUM 100 MG PO CAPS
100.0000 mg | ORAL_CAPSULE | Freq: Two times a day (BID) | ORAL | Status: DC
  Administered 2018-03-18 – 2018-03-19 (×2): 100 mg via ORAL
  Filled 2018-03-18 (×2): qty 1

## 2018-03-18 MED ORDER — GABAPENTIN 300 MG PO CAPS
300.0000 mg | ORAL_CAPSULE | Freq: Three times a day (TID) | ORAL | Status: DC
  Administered 2018-03-18 – 2018-03-19 (×3): 300 mg via ORAL
  Filled 2018-03-18 (×3): qty 1

## 2018-03-18 MED ORDER — LISINOPRIL 20 MG PO TABS
20.0000 mg | ORAL_TABLET | Freq: Every day | ORAL | Status: DC
  Administered 2018-03-19: 20 mg via ORAL
  Filled 2018-03-18: qty 1

## 2018-03-18 MED ORDER — IOHEXOL 300 MG/ML  SOLN
100.0000 mL | Freq: Once | INTRAMUSCULAR | Status: AC | PRN
  Administered 2018-03-18: 30 mL via INTRA_ARTERIAL

## 2018-03-18 MED ORDER — HYDROCHLOROTHIAZIDE 25 MG PO TABS
25.0000 mg | ORAL_TABLET | Freq: Every day | ORAL | Status: DC
  Administered 2018-03-19: 25 mg via ORAL
  Filled 2018-03-18: qty 1

## 2018-03-18 MED ORDER — MIDAZOLAM HCL 2 MG/2ML IJ SOLN
INTRAMUSCULAR | Status: AC | PRN
  Administered 2018-03-18: 2 mg via INTRAVENOUS
  Administered 2018-03-18 (×2): 1 mg via INTRAVENOUS

## 2018-03-18 MED ORDER — IOPAMIDOL (ISOVUE-300) INJECTION 61%: 100.0000 mL | Freq: Once | INTRAVENOUS | Status: DC | PRN

## 2018-03-18 MED ORDER — IOHEXOL 300 MG/ML  SOLN
100.0000 mL | Freq: Once | INTRAMUSCULAR | Status: AC | PRN
  Administered 2018-03-18: 35 mL via INTRA_ARTERIAL

## 2018-03-18 MED ORDER — CYCLOBENZAPRINE HCL 5 MG PO TABS: 5.0000 mg | ORAL_TABLET | Freq: Three times a day (TID) | ORAL | Status: DC | PRN

## 2018-03-18 MED ORDER — SODIUM CHLORIDE 0.9 % IV SOLN
INTRAVENOUS | Status: DC
  Administered 2018-03-18 (×2): via INTRAVENOUS

## 2018-03-18 MED ORDER — LIDOCAINE HCL 1 % IJ SOLN
INTRAMUSCULAR | Status: AC
  Filled 2018-03-18: qty 20

## 2018-03-18 MED ORDER — IOHEXOL 300 MG/ML  SOLN
100.0000 mL | Freq: Once | INTRAMUSCULAR | Status: AC | PRN
  Administered 2018-03-18: 20 mL via INTRA_ARTERIAL

## 2018-03-18 MED ORDER — KETOROLAC TROMETHAMINE 30 MG/ML IJ SOLN
30.0000 mg | Freq: Once | INTRAMUSCULAR | Status: AC
  Administered 2018-03-18: 30 mg via INTRAVENOUS

## 2018-03-18 MED ORDER — INSULIN ASPART 100 UNIT/ML ~~LOC~~ SOLN
0.0000 [IU] | Freq: Three times a day (TID) | SUBCUTANEOUS | Status: DC
  Administered 2018-03-18 – 2018-03-19 (×2): 8 [IU] via SUBCUTANEOUS

## 2018-03-18 MED ORDER — PIPERACILLIN-TAZOBACTAM 3.375 G IVPB 30 MIN
3.3750 g | Freq: Once | INTRAVENOUS | Status: AC
  Administered 2018-03-18: 3.375 g via INTRAVENOUS
  Filled 2018-03-18: qty 50

## 2018-03-18 MED ORDER — SODIUM CHLORIDE 0.9 % IV SOLN: 250.0000 mL | INTRAVENOUS | Status: DC | PRN

## 2018-03-18 MED ORDER — SODIUM CHLORIDE 0.9 % IV SOLN
INTRAVENOUS | Status: DC
  Administered 2018-03-18: 08:00:00 via INTRAVENOUS

## 2018-03-18 MED ORDER — GLIPIZIDE 10 MG PO TABS
10.0000 mg | ORAL_TABLET | Freq: Two times a day (BID) | ORAL | Status: AC
  Administered 2018-03-18 – 2018-03-19 (×2): 10 mg via ORAL
  Filled 2018-03-18 (×2): qty 1

## 2018-03-18 MED ORDER — PRAVASTATIN SODIUM 20 MG PO TABS
80.0000 mg | ORAL_TABLET | Freq: Every day | ORAL | Status: DC
  Administered 2018-03-19: 80 mg via ORAL
  Filled 2018-03-18: qty 4

## 2018-03-18 MED ORDER — MIDAZOLAM HCL 2 MG/2ML IJ SOLN
INTRAMUSCULAR | Status: AC
  Filled 2018-03-18: qty 6

## 2018-03-18 MED ORDER — LIDOCAINE HCL (PF) 1 % IJ SOLN
INTRAMUSCULAR | Status: AC | PRN
  Administered 2018-03-18: 5 mL

## 2018-03-18 MED ORDER — SODIUM CHLORIDE 0.9% FLUSH
3.0000 mL | Freq: Two times a day (BID) | INTRAVENOUS | Status: DC
  Administered 2018-03-18: 3 mL via INTRAVENOUS

## 2018-03-18 MED ORDER — SENNA 8.6 MG PO TABS: 1.0000 | ORAL_TABLET | Freq: Every day | ORAL | Status: DC | PRN

## 2018-03-18 MED ORDER — PROMETHAZINE HCL 25 MG PO TABS: 25.0000 mg | ORAL_TABLET | Freq: Three times a day (TID) | ORAL | Status: DC | PRN

## 2018-03-18 MED ORDER — FENTANYL CITRATE (PF) 100 MCG/2ML IJ SOLN
INTRAMUSCULAR | Status: AC
  Filled 2018-03-18: qty 4

## 2018-03-18 MED ORDER — SODIUM CHLORIDE 0.9% FLUSH: 3.0000 mL | INTRAVENOUS | Status: DC | PRN

## 2018-03-18 MED ORDER — KETOROLAC TROMETHAMINE 30 MG/ML IJ SOLN
INTRAMUSCULAR | Status: AC
  Administered 2018-03-18: 30 mg via INTRAVENOUS
  Filled 2018-03-18: qty 1

## 2018-03-18 MED ORDER — IOHEXOL 300 MG/ML  SOLN
100.0000 mL | Freq: Once | INTRAMUSCULAR | Status: AC | PRN
  Administered 2018-03-18: 20 mL via INTRAVENOUS

## 2018-03-18 MED ORDER — KETOROLAC TROMETHAMINE 30 MG/ML IJ SOLN
30.0000 mg | Freq: Four times a day (QID) | INTRAMUSCULAR | Status: DC
  Administered 2018-03-18 – 2018-03-19 (×3): 30 mg via INTRAVENOUS
  Filled 2018-03-18 (×3): qty 1

## 2018-03-18 MED ORDER — TRAMADOL HCL 50 MG PO TABS
50.0000 mg | ORAL_TABLET | Freq: Four times a day (QID) | ORAL | Status: DC | PRN
  Administered 2018-03-18: 50 mg via ORAL
  Filled 2018-03-18: qty 1

## 2018-03-18 MED ORDER — FENTANYL CITRATE (PF) 100 MCG/2ML IJ SOLN
INTRAMUSCULAR | Status: AC | PRN
  Administered 2018-03-18 (×2): 50 ug via INTRAVENOUS

## 2018-03-18 NOTE — H&P (Signed)
Chief Complaint: Patient was seen in consultation today for DEB-TACE at the request of [facility]   Supervising Physician: Aletta Edouard  Patient Status: [facility] - Out-pt  History of Present Illness: [name] is a 52 y.o. male with [place] and Hep C. He was seen in consult by Dr. Kathlene Cote about a month ago and is now scheduled for DEB-TACE therapy. See full consult note from Dr. Kathlene Cote for detailed discussion. PMHx, meds, labs, imaging, allergies reviewed. Feels well, no recent fevers, chills, illness. Has been NPO today as directed. Finacee at bedside.   Past Medical History:  Diagnosis Date  . Diabetes mellitus   . Elevated cholesterol   . Hepatitis C   . Hypertension     Past Surgical History:  Procedure Laterality Date  . I&D EXTREMITY  06/28/2011   Procedure: IRRIGATION AND DEBRIDEMENT EXTREMITY;  Surgeon: Linna Hoff;  Location: [facility];  Service: Orthopedics;  Laterality: Left;  with application of wound vac  . INCISION AND DRAINAGE OF WOUND  07/02/2011   Procedure: IRRIGATION AND DEBRIDEMENT WOUND;  Surgeon: Linna Hoff;  Location: [facility];  Service: Orthopedics;  Laterality: Left;  Irrigation and Debridement of left arm wound with  Skin Grafting from left thigh   . IR RADIOLOGIST EVAL & MGMT  02/16/2018  . JOINT REPLACEMENT    . PARTIAL HIP ARTHROPLASTY      Allergies: Gadolinium derivatives and Multihance [gadobenate]  Medications: Prior to Admission medications   Medication Sig Start Date End Date Taking? Authorizing Provider  cyclobenzaprine (FLEXERIL) 5 MG tablet Take 1-2 tablets (5-10 mg total) by mouth 3 (three) times daily as needed for muscle spasms. 12/14/17  Yes Truitt Merle, MD  gabapentin (NEURONTIN) 300 MG capsule Take 1 capsule (300 mg total) by mouth 3 (three) times daily. 01/05/18  Yes Dorena Dew, FNP  glipiZIDE (GLUCOTROL) 10 MG tablet TAKE 1 TABLET(10 MG) BY MOUTH TWICE DAILY BEFORE A MEAL 01/05/18  Yes Cammie Sickle M, FNP  glucose  blood test strip Check blood sugar 3 times per day prior to meals. Use as instructed 12/22/17  Yes Dorena Dew, FNP  hydrochlorothiazide (HYDRODIURIL) 25 MG tablet Take 1 tablet (25 mg total) by mouth daily. 01/05/18  Yes Dorena Dew, FNP  insulin glargine (LANTUS) 100 UNIT/ML injection Inject 0.3 mLs (30 Units total) into the skin at bedtime. 01/05/18  Yes Dorena Dew, FNP  lisinopril (PRINIVIL,ZESTRIL) 20 MG tablet Take 1 tablet (20 mg total) by mouth daily. 01/05/18  Yes Dorena Dew, FNP  metFORMIN (GLUCOPHAGE) 1000 MG tablet TAKE 1 TABLET(1000 MG) BY MOUTH TWICE DAILY 03/14/18  Yes Lanae Boast, FNP  ondansetron (ZOFRAN) 4 MG tablet Take 1 tablet (4 mg total) by mouth every 8 (eight) hours as needed for nausea or vomiting. 01/07/18  Yes Dorena Dew, FNP  penicillin v potassium (VEETID) 500 MG tablet Take 1 tablet (500 mg total) by mouth 3 (three) times daily. 02/27/18  Yes Tereasa Coop, PA-C  pravastatin (PRAVACHOL) 80 MG tablet Take 1 tablet (80 mg total) daily by mouth. 06/17/17  Yes Dorena Dew, FNP  traMADol (ULTRAM) 50 MG tablet Take 1 tablet (50 mg total) by mouth every 6 (six) hours as needed. 12/11/17  Yes Kinnie Feil, MD  naproxen (NAPROSYN) 375 MG tablet Take 1 tablet (375 mg total) by mouth 2 (two) times daily. 02/27/18   Tereasa Coop, PA-C  senna (SENOKOT) 8.6 MG tablet Take 1 tablet by mouth as  needed for constipation.    [provider]  Sofosbuvir-Velpatasvir (EPCLUSA) 400-100 MG TABS Take 1 tablet by mouth daily. Patient not taking: Reported on 02/16/2018 11/16/17   Thayer Headings, MD     Family History  Problem Relation Age of Onset  . Diabetes Unknown   . Hypertension Unknown   . Diabetes Mother   . Liver disease Father     Social History   Socioeconomic History  . Marital status: Single    Spouse name: Not on file  . Number of children: Not on file  . Years of education: Not on file  . Highest education level: Not on  file  Occupational History  . Not on file  Social Needs  . Financial resource strain: Not on file  . Food insecurity:    Worry: Not on file    Inability: Not on file  . Transportation needs:    Medical: Not on file    Non-medical: Not on file  Tobacco Use  . Smoking status: Current Every Day Smoker    Packs/day: 1.00    Years: 40.00    Pack years: 40.00    Types: Cigarettes  . Smokeless tobacco: Never Used  Substance and Sexual Activity  . Alcohol use: Yes    Alcohol/week: 40.0 - 41.0 standard drinks    Types: 2 - 3 Glasses of wine, 24 Cans of beer, 14 Standard drinks or equivalent per week  . Drug use: No    Comment: history of iv drugs   . Sexual activity: Yes  Lifestyle  . Physical activity:    Days per week: Not on file    Minutes per session: Not on file  . Stress: Not on file  Relationships  . Social connections:    Talks on phone: Not on file    Gets together: Not on file    Attends religious service: Not on file    Active member of club or organization: Not on file    Attends meetings of clubs or organizations: Not on file    Relationship status: Not on file  Other Topics Concern  . Not on file  Social History Narrative  . Not on file     Review of Systems: A 12 point ROS discussed and pertinent positives are indicated in the HPI above.  All other systems are negative.  Review of Systems  Vital Signs: BP 140/89   Pulse 77   Temp 98.3 F (36.8 C) (Oral)   Resp 18   SpO2 99%   Physical Exam  Constitutional: He is oriented to person, place, and time. He appears well-developed and well-nourished. No distress.  HENT:  Head: Normocephalic.  Mouth/Throat: Oropharynx is clear and moist.  Neck: Normal range of motion. No JVD present.  Cardiovascular: Normal rate, regular rhythm, normal heart sounds and intact distal pulses.  Pulmonary/Chest: Effort normal and breath sounds normal. No respiratory distress.  Abdominal: Soft. He exhibits no mass. There is  no tenderness.  Neurological: He is alert and oriented to person, place, and time.  Skin: Skin is warm and dry.  Psychiatric: He has a normal mood and affect.    Imaging: Ct Abdomen W Wo Contrast  Result Date: 02/18/2018 CLINICAL DATA:  Hepatocellular carcinoma diagnosed in May 2019. No subsequent surgery or treatment. EXAM: CT ABDOMEN WITHOUT AND WITH CONTRAST TECHNIQUE: Multidetector CT imaging of the abdomen was performed following the standard protocol before and following the bolus administration of intravenous contrast. CONTRAST:  165m ISOVUE-300  IOPAMIDOL (ISOVUE-300) INJECTION 61% COMPARISON:  PET-CT 12/22/2017.  Abdominal MRI 12/10/2017. FINDINGS: Lower chest: Stable linear scarring at the left lung base and mild emphysema. The lung bases are otherwise clear. No significant pleural effusion. Hepatobiliary: The liver demonstrates borderline steatosis without definite morphologic changes of cirrhosis. The immediate postcontrast images demonstrate multiple hypervascular lesions throughout the liver. The largest lesion in segment 4A demonstrates more heterogeneous early contrast enhancement. On the more delayed images, numerous relatively hypovascular hepatic lesions with contrast washout are demonstrated. The dominant lesion measures 5.2 x 4.3 cm on image 17/6 compared with 4.9 x 4.0 cm on the MRI. Overall, there are proximally 20 lesions. Additional sizable lesions include a 2.3 cm lesion in segment 3 (image 29/6) and a 1.6 cm lesion in segment 7 (image 22/6). There is a tiny calcified gallstone. No gallbladder wall thickening or biliary dilatation. The portal vein is patent. Pancreas: Unremarkable. No pancreatic ductal dilatation or surrounding inflammatory changes. Spleen: Stable calcified splenic granuloma. The spleen is normal in size without suspicious focal finding. Adrenals/Urinary Tract: Both adrenal glands appear normal. Both kidneys appear normal. There is no hydronephrosis or delay in  contrast excretion. Stomach/Bowel: No evidence of bowel wall thickening, distention or surrounding inflammatory change.No focal colonic lesions are identified to correspond with the findings on PET-CT. The visualized appendix appears normal. Vascular/Lymphatic: There are no enlarged abdominal lymph nodes. Mild aortic and branch vessel atherosclerosis. The portal, superior mesenteric and splenic veins are patent. No changes of portal hypertension. Other: There is no ascites or peritoneal nodularity. The anterior abdominal wall is intact. Musculoskeletal: No acute or significant osseous findings. IMPRESSION: 1. Multifocal hepatocellular carcinoma, mildly progressive from baseline MRI 12/10/2017. 2. No evidence of extrahepatic nodal metastases or vascular involvement. 3. Borderline hepatic steatosis. No morphologic changes of underlying cirrhosis or portal hypertension. 4. Cholelithiasis. 5. Mild Aortic Atherosclerosis (ICD10-I70.0). Electronically Signed   By: Richardean Sale M.D.   On: 02/18/2018 07:14   Ir Radiologist Eval & Mgmt  Result Date: 02/16/2018 Please refer to notes tab for details about interventional procedure. (Op Note)   Labs:  CBC: Recent Labs    09/23/17 1122 12/09/17 1357 12/23/17 0645 03/18/18 0800  WBC 5.5 6.1 6.5 6.4  HGB 14.7 13.4 13.2 15.3  HCT 42.4 38.7 39.9 45.4  PLT 302 295 327 334    COAGS: Recent Labs    09/23/17 1122 12/23/17 0645 03/18/18 0800  INR 1.0 1.01 0.93  APTT  --  33 32    BMP: Recent Labs    06/17/17 1549 09/23/17 1122 12/09/17 1357 02/17/18 1428 03/18/18 0800  NA 140 142 138  --  143  K 4.3 4.7 4.4  --  4.2  CL 105 106 105  --  105  CO2 28 28 27   --  27  GLUCOSE 243* 136* 202*  --  143*  BUN 18 21 6*  --  17  CALCIUM 9.5 9.8 9.4  --  9.8  CREATININE 1.37* 1.24 1.10 1.30* 1.26*  GFRNONAA 59* 67 77  --  >60  GFRAA 69 78 90  --  >60    LIVER FUNCTION TESTS: Recent Labs    06/17/17 1549 09/23/17 1122 09/23/17 1125  12/09/17 1357 03/18/18 0800  BILITOT 0.4 0.4  --  0.6 0.6  AST 51* 52*  --  92* 65*  ALT 68* 80* 78* 93* 95*  ALKPHOS  --   --   --   --  51  PROT 6.9 6.7  --  6.7 7.7  ALBUMIN  --   --   --   --  4.1    TUMOR MARKERS: Recent Labs    12/09/17 1357  AFPTM 12.0*    Assessment and Plan: [place] Hepatitis C Plan for Transarterial chemoembolization today Labs reviewed. Cr 1.26 but that is about baseline for patient. Risks and benefits of DEB TACE were discussed with the patient including, but not limited to, bleeding, infection, vascular injury, contrast induced renal failure, post procedural pain, nausea, vomiting, fatigue, progression of liver failure, chemical cholecystitis, or need for additional procedures.  This interventional procedure involves the use of X-rays and because of the nature of the planned procedure, it is possible that we will have prolonged use of X-ray fluoroscopy.  Potential radiation risks to you include (but are not limited to) the following: - A slightly elevated risk for cancer  several years later in life. This risk is typically less than 0.5% percent. This risk is low in comparison to the normal incidence of human cancer, which is 33% for women and 50% for men according to the [place]. - Radiation induced injury can include skin redness, resembling a rash, tissue breakdown / ulcers and hair loss (which can be temporary or permanent).   The likelihood of either of these occurring depends on the difficulty of the procedure and whether you are sensitive to radiation due to previous procedures, disease, or genetic conditions.   IF your procedure requires a prolonged use of radiation, you will be notified and given written instructions for further action.  It is your responsibility to monitor the irradiated area for the 2 weeks following the procedure and to notify your physician if you are concerned that you have suffered a radiation induced  injury.    All of the patient's questions were answered, patient is agreeable to proceed. Pt to be admitted for observation.  Consent signed and in chart.    Thank you for this interesting consult.  I greatly enjoyed meeting [name] and look forward to participating in their care.  A copy of this report was sent to the requesting provider on this date.  Electronically Signed: Ascencion Dike, PA-C 03/18/2018, 9:04 AM   I spent a total of 20 minutes in face to face in clinical consultation, greater than 50% of which was counseling/coordinating care for DEB-TACE.

## 2018-03-18 NOTE — Progress Notes (Signed)
Called report to Production assistant, radio on [place].

## 2018-03-18 NOTE — Progress Notes (Signed)
Doing well. Resting in bed. No c/o abd pain or nausea. BP (!) 126/91 (BP Location: Left Arm)   Pulse 70   Temp 98.5 F (36.9 C) (Oral)   Resp 16   SpO2 100%  Abd: soft, NT (R)groin site clean, soft, NT Leg warm, good pedal pulse.  S/p DEB-TACE of [name] Anticipate discharge home tomorrow am. F/u 4 weeks.  Christopher Dike PA-C Interventional Radiology 03/18/2018 3:34 PM

## 2018-03-18 NOTE — Procedures (Signed)
Interventional Radiology Procedure Note  Procedure: SMA and hepatic arteriography with chemoembolization of hepatocellular carcinoma  Complications: None  Estimated Blood Loss: < 10 mL  Findings: Predominant left lobe tumor and satellite tumor supplied by a branch of the left hepatic artery.  Treated with drug-eluting bead embolization. Total of 1.5 vials of 100-300 micron sized LC beads loaded with 112.5 mg doxorubicin was administered.  Arteriotomy closed with Cordis ExoSeal.  Plan: Overnight observation.  Venetia Night. Kathlene Cote, M.D Pager:  [phone]

## 2018-03-19 DIAGNOSIS — Z91041 Radiographic dye allergy status: Secondary | ICD-10-CM | POA: Diagnosis not present

## 2018-03-19 DIAGNOSIS — Z794 Long term (current) use of insulin: Secondary | ICD-10-CM | POA: Diagnosis not present

## 2018-03-19 DIAGNOSIS — E7800 Pure hypercholesterolemia, unspecified: Secondary | ICD-10-CM | POA: Diagnosis not present

## 2018-03-19 DIAGNOSIS — I10 Essential (primary) hypertension: Secondary | ICD-10-CM | POA: Diagnosis not present

## 2018-03-19 DIAGNOSIS — C220 Liver cell carcinoma: Secondary | ICD-10-CM | POA: Diagnosis not present

## 2018-03-19 DIAGNOSIS — E119 Type 2 diabetes mellitus without complications: Secondary | ICD-10-CM | POA: Diagnosis not present

## 2018-03-19 LAB — COMPREHENSIVE METABOLIC PANEL
ALT: 82 U/L — AB (ref 0–44)
AST: 60 U/L — AB (ref 15–41)
Albumin: 3.5 g/dL (ref 3.5–5.0)
Alkaline Phosphatase: 51 U/L (ref 38–126)
Anion gap: 8 (ref 5–15)
BUN: 30 mg/dL — AB (ref 6–20)
CHLORIDE: 105 mmol/L (ref 98–111)
CO2: 23 mmol/L (ref 22–32)
Calcium: 8.8 mg/dL — ABNORMAL LOW (ref 8.9–10.3)
Creatinine, Ser: 1.16 mg/dL (ref 0.61–1.24)
GFR calc Af Amer: >60 mL/min (ref >60)
Glucose, Bld: 290 mg/dL — ABNORMAL HIGH (ref 70–99)
Potassium: 4.1 mmol/L (ref 3.5–5.1)
SODIUM: 136 mmol/L (ref 135–145)
Total Bilirubin: 0.7 mg/dL (ref 0.3–1.2)
Total Protein: 6.5 g/dL (ref 6.5–8.1)

## 2018-03-19 LAB — CBC
HCT: 40.3 % (ref 39.0–52.0)
HEMOGLOBIN: 13.6 g/dL (ref 13.0–17.0)
MCH: 29.6 pg (ref 26.0–34.0)
MCHC: 33.7 g/dL (ref 30.0–36.0)
MCV: 87.6 fL (ref 78.0–100.0)
PLATELETS: 304 10*3/uL (ref 150–400)
RBC: 4.6 MIL/uL (ref 4.22–5.81)
RDW: 13.1 % (ref 11.5–15.5)
WBC: 12.4 10*3/uL — AB (ref 4.0–10.5)

## 2018-03-19 NOTE — Plan of Care (Signed)
Pt a&ox4; reviewed discharge orders with patient; d/c IV; pt ready for discharge

## 2018-03-19 NOTE — Discharge Summary (Signed)
Patient ID: [name] MRN: [id number] DOB/AGE: 1965/11/04 52 y.o.  Admit date: 03/18/2018 Discharge date: 03/19/2018  Supervising Physician: Markus Daft  Patient Status: [facility] - In-pt  Admission Diagnoses: Hepatocellular carcinoma  Discharge Diagnoses:  Active Problems:   Hepatocellular carcinoma [facility])   Discharged Condition: good  Hospital Course:  [name] is a 52 year old male with history of HCC and Hep C who was referred to Dr. Kathlene Cote for chemoembolization therapy.  Patient met with Dr. Kathlene Cote in [place] clinic 02/16/18 to discuss management and elected to proceed with intervention.  He presented to [facility] yesterday for procedure and underwent successful SMA and hepatic arteriography with chemoembolization of HCC of the predominant left lobe tumor.  He was admitted for observation and pain control overnight.  He is assessed this morning and found to be in good condition, no reported issues or concerns overnight.  He denies pain or nausea.  He has been able to eat with tolerance.  He has ambulated in his room and voided successfully.  He is ready for discharge home today.  Patient and wife given discharge instructions, follow-up plans, and all questions answered. He does have anti-nausea medication at home for use if needed, although he states this has improved since procedure. Plan to see patient in follow-up with Dr. Kathlene Cote in 4 weeks.  Patient and wife understand they will hear from schedulers with date and time of appointment.   Consults: None  Discharge Exam: Blood pressure (!) 148/88, pulse (!) 59, temperature 98.5 F (36.9 C), temperature source Oral, resp. rate 16, SpO2 98 %. General appearance: alert, cooperative and no distress Resp: clear to auscultation bilaterally Cardio: regular rate and rhythm, S1, S2 normal, no murmur, click, rub or gallop GI: soft, non-tender; bowel sounds normal; no masses,  no organomegaly Skin: Skin color, texture, turgor normal. No rashes or  lesions or erythema.  Groin insertion site intact.  No evidence of hematoma or pseudoaneurysm  Disposition: Discharge disposition: 01-Home or Self Care       Discharge Instructions    Diet - low sodium heart healthy   Complete by:  As directed    Discharge instructions   Complete by:  As directed    -may shower in 24 hrs, do not submerge in bath tub for at least 3 days -expect to be tired with gradual improvement.  No lifting or heavy exercise for 1-2 weeks -take Zofran as needed for nausea.  -You will hear from schedulers with date and time of follow-up appointment.   Increase activity slowly   Complete by:  As directed      Allergies as of 03/19/2018      Reactions   Gadolinium Derivatives Hives, Itching   Pt after contrast injection. Immediately complained of total body itching and hives. Pt was pulled out of the scanner and given IV benadryl per ER MD.    Multihance [gadobenate] Itching   1-2 minutes into contrast infusion, patient itching all over face, neck and chest requiring IV benadryl       Medication List    TAKE these medications   cyclobenzaprine 5 MG tablet Commonly known as:  FLEXERIL Take 1-2 tablets (5-10 mg total) by mouth 3 (three) times daily as needed for muscle spasms.   gabapentin 300 MG capsule Commonly known as:  NEURONTIN Take 1 capsule (300 mg total) by mouth 3 (three) times daily.   glipiZIDE 10 MG tablet Commonly known as:  GLUCOTROL TAKE 1 TABLET(10 MG) BY MOUTH  TWICE DAILY BEFORE A MEAL   glucose blood test strip Check blood sugar 3 times per day prior to meals. Use as instructed   hydrochlorothiazide 25 MG tablet Commonly known as:  HYDRODIURIL Take 1 tablet (25 mg total) by mouth daily.   insulin glargine 100 UNIT/ML injection Commonly known as:  LANTUS Inject 0.3 mLs (30 Units total) into the skin at bedtime.   lisinopril 20 MG tablet Commonly known as:  PRINIVIL,ZESTRIL Take 1 tablet (20 mg total) by mouth daily.     metFORMIN 1000 MG tablet Commonly known as:  GLUCOPHAGE TAKE 1 TABLET(1000 MG) BY MOUTH TWICE DAILY   naproxen 375 MG tablet Commonly known as:  NAPROSYN Take 1 tablet (375 mg total) by mouth 2 (two) times daily.   ondansetron 4 MG tablet Commonly known as:  ZOFRAN Take 1 tablet (4 mg total) by mouth every 8 (eight) hours as needed for nausea or vomiting.   penicillin v potassium 500 MG tablet Commonly known as:  VEETID Take 1 tablet (500 mg total) by mouth 3 (three) times daily.   pravastatin 80 MG tablet Commonly known as:  PRAVACHOL Take 1 tablet (80 mg total) daily by mouth.   senna 8.6 MG tablet Commonly known as:  SENOKOT Take 1 tablet by mouth as needed for constipation.   Sofosbuvir-Velpatasvir 400-100 MG Tabs Take 1 tablet by mouth daily.   traMADol 50 MG tablet Commonly known as:  ULTRAM Take 1 tablet (50 mg total) by mouth every 6 (six) hours as needed.      Follow-up Information    Aletta Edouard, MD Follow up.   Specialties:  Interventional Radiology, Radiology Why:  You will hear from schedulers with date and time of appointment. Contact information: [place] [address] [phone]            Electronically Signed: Docia Barrier, PA 03/19/2018, 9:00 AM   I have spent Greater Than 30 Minutes discharging [employer].

## 2018-03-21 LAB — GLUCOSE, CAPILLARY
GLUCOSE-CAPILLARY: 267 mg/dL — AB (ref 70–99)
GLUCOSE-CAPILLARY: 276 mg/dL — AB (ref 70–99)

## 2018-03-24 ENCOUNTER — Encounter (HOSPITAL_COMMUNITY): Payer: Self-pay | Admitting: Interventional Radiology

## 2018-03-24 ENCOUNTER — Telehealth: Payer: Self-pay | Admitting: *Deleted

## 2018-03-24 ENCOUNTER — Telehealth: Payer: Self-pay | Admitting: Student

## 2018-03-24 NOTE — Telephone Encounter (Signed)
Patient calling having rt sided pain. S/w pt wife he is not taking anything at all for pain. Per Dr. Kathlene Cote he can take 400-600 mg ibuprofen. Patient agrees.Cathren Harsh

## 2018-03-24 NOTE — Telephone Encounter (Signed)
Patient's wife called IR clinic today to report patient has been complaining of right-sided pain this week. Discussed case with Dr. Kathlene Cote. PA attempted to reach patient at all numbers provided: [phone], no connection made, no answer [phone]- no answer, unidentified VM [phone]- no answer, unidentified VM.  Left message at the [phone] for patient to return call so we could discuss symptoms and management.    Brynda Greathouse, MS RD PA-C 2:44 PM

## 2018-04-07 ENCOUNTER — Ambulatory Visit: Payer: Self-pay | Admitting: Family Medicine

## 2018-04-13 ENCOUNTER — Other Ambulatory Visit: Payer: Self-pay | Admitting: *Deleted

## 2018-04-13 ENCOUNTER — Telehealth: Payer: Self-pay | Admitting: Radiology

## 2018-04-13 ENCOUNTER — Other Ambulatory Visit (HOSPITAL_COMMUNITY): Payer: Self-pay | Admitting: Interventional Radiology

## 2018-04-13 ENCOUNTER — Other Ambulatory Visit: Payer: Self-pay

## 2018-04-13 DIAGNOSIS — C220 Liver cell carcinoma: Secondary | ICD-10-CM

## 2018-04-13 NOTE — Telephone Encounter (Signed)
NO SHOW for 1 pm follow up appointment with Dr Kathlene Cote.  Left voice mail message requesting patient call back to reschedule.  Brenton Joines Riki Rusk, RN 04/13/2018 1:26 PM

## 2018-04-14 ENCOUNTER — Other Ambulatory Visit: Payer: Self-pay | Admitting: Family Medicine

## 2018-04-20 ENCOUNTER — Ambulatory Visit: Payer: Self-pay | Admitting: Family Medicine

## 2018-05-15 ENCOUNTER — Other Ambulatory Visit: Payer: Self-pay | Admitting: Family Medicine

## 2018-05-15 DIAGNOSIS — Z794 Long term (current) use of insulin: Principal | ICD-10-CM

## 2018-05-15 DIAGNOSIS — E118 Type 2 diabetes mellitus with unspecified complications: Secondary | ICD-10-CM

## 2018-05-18 ENCOUNTER — Other Ambulatory Visit: Payer: Self-pay

## 2018-05-18 ENCOUNTER — Other Ambulatory Visit: Payer: Self-pay | Admitting: Radiology

## 2018-05-18 ENCOUNTER — Ambulatory Visit (HOSPITAL_COMMUNITY): Admission: RE | Admit: 2018-05-18 | Payer: Medicare Other | Source: Ambulatory Visit

## 2018-05-18 MED ORDER — PREDNISONE 50 MG PO TABS: ORAL_TABLET | ORAL | 0 refills | Status: DC

## 2018-05-18 MED ORDER — DIPHENHYDRAMINE HCL 50 MG PO TABS: 50.0000 mg | ORAL_TABLET | ORAL | 0 refills | Status: DC

## 2018-05-31 ENCOUNTER — Other Ambulatory Visit: Payer: Self-pay | Admitting: Family Medicine

## 2018-05-31 ENCOUNTER — Encounter (HOSPITAL_COMMUNITY): Payer: Self-pay

## 2018-05-31 DIAGNOSIS — E118 Type 2 diabetes mellitus with unspecified complications: Secondary | ICD-10-CM

## 2018-05-31 DIAGNOSIS — Z794 Long term (current) use of insulin: Principal | ICD-10-CM

## 2018-06-01 ENCOUNTER — Ambulatory Visit (HOSPITAL_COMMUNITY)
Admission: RE | Admit: 2018-06-01 | Discharge: 2018-06-01 | Disposition: A | Discharge status: Home / Self Care | Payer: Medicare Other | Source: Ambulatory Visit | Attending: Interventional Radiology | Admitting: Interventional Radiology

## 2018-06-01 ENCOUNTER — Other Ambulatory Visit: Payer: Self-pay

## 2018-06-01 NOTE — Progress Notes (Signed)
Patient was scheduled for MRI Abdomen wo/w today at 7am. Patient was a no show for appointment.  Called Judson Roch in MRI to get message to Vickie/Henri bhj  7:41a

## 2018-06-06 ENCOUNTER — Other Ambulatory Visit: Payer: Self-pay | Admitting: Radiology

## 2018-06-06 MED ORDER — PREDNISONE 50 MG PO TABS: ORAL_TABLET | ORAL | 0 refills | Status: AC

## 2018-06-06 MED ORDER — PREDNISONE 50 MG PO TABS: ORAL_TABLET | ORAL | 0 refills | Status: DC

## 2018-06-06 MED ORDER — DIPHENHYDRAMINE HCL 50 MG PO TABS: 50.0000 mg | ORAL_TABLET | ORAL | 0 refills | Status: DC

## 2018-06-06 MED ORDER — DIPHENHYDRAMINE HCL 50 MG PO TABS: 50.0000 mg | ORAL_TABLET | Freq: Every evening | ORAL | 0 refills | Status: DC | PRN

## 2018-06-07 ENCOUNTER — Ambulatory Visit (HOSPITAL_COMMUNITY): Admission: RE | Admit: 2018-06-07 | Payer: Medicare Other | Source: Ambulatory Visit

## 2018-06-14 ENCOUNTER — Ambulatory Visit (HOSPITAL_COMMUNITY)
Admission: RE | Admit: 2018-06-14 | Discharge: 2018-06-14 | Disposition: A | Discharge status: Home / Self Care | Payer: Medicare Other | Source: Ambulatory Visit | Attending: Interventional Radiology | Admitting: Interventional Radiology

## 2018-06-14 DIAGNOSIS — C220 Liver cell carcinoma: Secondary | ICD-10-CM | POA: Diagnosis not present

## 2018-06-14 LAB — POCT I-STAT CREATININE: CREATININE: 1.3 mg/dL — AB (ref 0.61–1.24)

## 2018-06-14 MED ORDER — GADOBUTROL 1 MMOL/ML IV SOLN
7.0000 mL | Freq: Once | INTRAVENOUS | Status: AC | PRN
  Administered 2018-06-14: 7 mL via INTRAVENOUS

## 2018-06-18 ENCOUNTER — Other Ambulatory Visit: Payer: Self-pay | Admitting: Family Medicine

## 2018-06-22 ENCOUNTER — Other Ambulatory Visit: Payer: Self-pay

## 2018-07-07 ENCOUNTER — Ambulatory Visit: Payer: Self-pay | Admitting: Family Medicine

## 2018-07-08 ENCOUNTER — Other Ambulatory Visit: Payer: Self-pay | Admitting: Hematology

## 2018-07-08 ENCOUNTER — Inpatient Hospital Stay: Payer: Medicare Other

## 2018-07-08 ENCOUNTER — Ambulatory Visit: Payer: Self-pay | Admitting: Hematology

## 2018-07-08 DIAGNOSIS — C220 Liver cell carcinoma: Secondary | ICD-10-CM

## 2018-07-11 ENCOUNTER — Other Ambulatory Visit: Payer: Self-pay

## 2018-07-11 DIAGNOSIS — C220 Liver cell carcinoma: Secondary | ICD-10-CM

## 2018-07-11 MED ORDER — ONDANSETRON HCL 4 MG PO TABS: ORAL_TABLET | ORAL | 0 refills | Status: DC

## 2018-07-12 ENCOUNTER — Encounter: Payer: Self-pay | Admitting: Radiology

## 2018-07-12 ENCOUNTER — Inpatient Hospital Stay: Payer: Medicare Other | Attending: Hematology

## 2018-07-12 ENCOUNTER — Ambulatory Visit
Admission: RE | Admit: 2018-07-12 | Discharge: 2018-07-12 | Disposition: A | Discharge status: Home / Self Care | Payer: Medicare Other | Source: Ambulatory Visit | Attending: Radiology | Admitting: Radiology

## 2018-07-12 DIAGNOSIS — I10 Essential (primary) hypertension: Secondary | ICD-10-CM | POA: Diagnosis not present

## 2018-07-12 DIAGNOSIS — C220 Liver cell carcinoma: Secondary | ICD-10-CM

## 2018-07-12 DIAGNOSIS — E119 Type 2 diabetes mellitus without complications: Secondary | ICD-10-CM | POA: Diagnosis not present

## 2018-07-12 DIAGNOSIS — Z79899 Other long term (current) drug therapy: Secondary | ICD-10-CM | POA: Insufficient documentation

## 2018-07-12 DIAGNOSIS — F17210 Nicotine dependence, cigarettes, uncomplicated: Secondary | ICD-10-CM | POA: Insufficient documentation

## 2018-07-12 DIAGNOSIS — K5900 Constipation, unspecified: Secondary | ICD-10-CM | POA: Diagnosis not present

## 2018-07-12 DIAGNOSIS — Z794 Long term (current) use of insulin: Secondary | ICD-10-CM | POA: Diagnosis not present

## 2018-07-12 DIAGNOSIS — B1920 Unspecified viral hepatitis C without hepatic coma: Secondary | ICD-10-CM | POA: Diagnosis not present

## 2018-07-12 HISTORY — PX: IR RADIOLOGIST EVAL & MGMT: IMG5224

## 2018-07-12 LAB — CBC WITH DIFFERENTIAL (CANCER CENTER ONLY)
Abs Immature Granulocytes: 0.02 10*3/uL (ref 0.00–0.07)
BASOS PCT: 1 %
Basophils Absolute: 0 10*3/uL (ref 0.0–0.1)
EOS ABS: 0.1 10*3/uL (ref 0.0–0.5)
Eosinophils Relative: 1 %
HCT: 44.7 % (ref 39.0–52.0)
Hemoglobin: 14.5 g/dL (ref 13.0–17.0)
Immature Granulocytes: 0 %
Lymphocytes Relative: 38 %
Lymphs Abs: 2 10*3/uL (ref 0.7–4.0)
MCH: 28.4 pg (ref 26.0–34.0)
MCHC: 32.4 g/dL (ref 30.0–36.0)
MCV: 87.6 fL (ref 80.0–100.0)
MONO ABS: 0.7 10*3/uL (ref 0.1–1.0)
MONOS PCT: 13 %
Neutro Abs: 2.4 10*3/uL (ref 1.7–7.7)
Neutrophils Relative %: 47 %
PLATELETS: 292 10*3/uL (ref 150–400)
RBC: 5.1 MIL/uL (ref 4.22–5.81)
RDW: 13.7 % (ref 11.5–15.5)
WBC Count: 5.1 10*3/uL (ref 4.0–10.5)
nRBC: 0 % (ref 0.0–0.2)

## 2018-07-12 LAB — CMP (CANCER CENTER ONLY)
ALBUMIN: 3.7 g/dL (ref 3.5–5.0)
ALK PHOS: 68 U/L (ref 38–126)
ALT: 70 U/L — ABNORMAL HIGH (ref 0–44)
AST: 46 U/L — AB (ref 15–41)
Anion gap: 7 (ref 5–15)
BILIRUBIN TOTAL: 0.4 mg/dL (ref 0.3–1.2)
BUN: 19 mg/dL (ref 6–20)
CALCIUM: 9.9 mg/dL (ref 8.9–10.3)
CO2: 28 mmol/L (ref 22–32)
Chloride: 107 mmol/L (ref 98–111)
Creatinine: 1.22 mg/dL (ref 0.61–1.24)
GFR, Est AFR Am: >60 mL/min (ref >60)
GFR, Estimated: >60 mL/min (ref >60)
GLUCOSE: 85 mg/dL (ref 70–99)
Potassium: 4.6 mmol/L (ref 3.5–5.1)
Sodium: 142 mmol/L (ref 135–145)
TOTAL PROTEIN: 7.4 g/dL (ref 6.5–8.1)

## 2018-07-12 NOTE — Progress Notes (Signed)
Chief Complaint: Hepatocellular carcinoma and status post transcatheter chemoembolization of a dominant 5.5 cm left lobe hepatocellular carcinoma with drug-eluting beads on 03/18/2018.  History of Present Illness: [name] is a 52 y.o. male status post DEB-TACE on 03/18/2018 to target a dominant left lobe HCC.  He did have some post embolic pain after the procedure but overall tolerated the procedure well.  He missed several postprocedural follow-up appointments in September and October.  A follow-up MRI of the abdomen was able to be performed with gadolinium on 06/14/2018.  He is complaining of some increasing right and mid abdominal pain which is not constant but has been getting worse recently.  He does have some associated nausea.  He is completely ambulatory but does spend much of his time in a chair at home.  Appetite has been fair.  Past Medical History:  Diagnosis Date  . Diabetes mellitus   . Elevated cholesterol   . Hepatitis C   . Hypertension     Past Surgical History:  Procedure Laterality Date  . I&D EXTREMITY  06/28/2011   Procedure: IRRIGATION AND DEBRIDEMENT EXTREMITY;  Surgeon: Linna Hoff;  Location: [facility];  Service: Orthopedics;  Laterality: Left;  with application of wound vac  . INCISION AND DRAINAGE OF WOUND  07/02/2011   Procedure: IRRIGATION AND DEBRIDEMENT WOUND;  Surgeon: Linna Hoff;  Location: [facility];  Service: Orthopedics;  Laterality: Left;  Irrigation and Debridement of left arm wound with  Skin Grafting from left thigh   . IR ANGIOGRAM SELECTIVE EACH ADDITIONAL VESSEL  03/18/2018  . IR ANGIOGRAM VISCERAL SELECTIVE  03/18/2018  . IR ANGIOGRAM VISCERAL SELECTIVE  03/18/2018  . IR EMBO TUMOR ORGAN ISCHEMIA INFARCT INC GUIDE ROADMAPPING  03/18/2018  . IR RADIOLOGIST EVAL & MGMT  02/16/2018  . IR RADIOLOGIST EVAL & MGMT  07/12/2018  . IR US GUIDE VASC ACCESS RIGHT  03/18/2018  . JOINT REPLACEMENT    . PARTIAL HIP ARTHROPLASTY       Allergies: Gadolinium derivatives and Multihance [gadobenate]  Medications: Prior to Admission medications   Medication Sig Start Date End Date Taking? Authorizing Provider  cyclobenzaprine (FLEXERIL) 5 MG tablet Take 1-2 tablets (5-10 mg total) by mouth 3 (three) times daily as needed for muscle spasms. 12/14/17   Truitt Merle, MD  diphenhydrAMINE (BENADRYL) 50 MG tablet Take 1 tablet (50 mg total) by mouth at bedtime as needed for itching. 06/06/18   Aletta Edouard, MD  diphenhydrAMINE (BENADRYL) 50 MG tablet Take 1 tablet (50 mg total) by mouth as directed for 1 day. Take Benadryl 50 mg by mouth 1 hr prior to MR app't 06/07/2018 06/07/18 06/08/18  Aletta Edouard, MD  gabapentin (NEURONTIN) 300 MG capsule Take 1 capsule (300 mg total) by mouth 3 (three) times daily. 01/05/18   Dorena Dew, FNP  glipiZIDE (GLUCOTROL) 10 MG tablet TAKE 1 TABLET(10 MG) BY MOUTH TWICE DAILY BEFORE A MEAL 05/31/18   Lanae Boast, FNP  glucose blood test strip Check blood sugar 3 times per day prior to meals. Use as instructed 12/22/17   Dorena Dew, FNP  hydrochlorothiazide (HYDRODIURIL) 25 MG tablet Take 1 tablet (25 mg total) by mouth daily. 01/05/18   Dorena Dew, FNP  insulin glargine (LANTUS) 100 UNIT/ML injection Inject 0.3 mLs (30 Units total) into the skin at bedtime. 01/05/18   Dorena Dew, FNP  lisinopril (PRINIVIL,ZESTRIL) 20 MG tablet Take 1 tablet (20 mg total) by mouth daily. 01/05/18  Dorena Dew, FNP  metFORMIN (GLUCOPHAGE) 1000 MG tablet TAKE 1 TABLET(1000 MG) BY MOUTH TWICE DAILY 05/16/18   Lanae Boast, FNP  naproxen (NAPROSYN) 375 MG tablet Take 1 tablet (375 mg total) by mouth 2 (two) times daily. 02/27/18   Tereasa Coop, PA-C  ondansetron (ZOFRAN) 4 MG tablet TAKE 1 TABLET(4 MG) BY MOUTH EVERY 8 HOURS AS NEEDED FOR NAUSEA OR VOMITING 07/11/18   Truitt Merle, MD  penicillin v potassium (VEETID) 500 MG tablet Take 1 tablet (500 mg total) by mouth 3 (three) times daily.  02/27/18   Tereasa Coop, PA-C  pravastatin (PRAVACHOL) 80 MG tablet Take 1 tablet (80 mg total) daily by mouth. 06/17/17   Dorena Dew, FNP  predniSONE (DELTASONE) 50 MG tablet Prednisone 50 mg by mouth 13 hrs, 7 hrs and 1 hr prior to MR app't. 06/06/18   Aletta Edouard, MD  senna (SENOKOT) 8.6 MG tablet Take 1 tablet by mouth as needed for constipation.    [provider]  Sofosbuvir-Velpatasvir (EPCLUSA) 400-100 MG TABS Take 1 tablet by mouth daily. 11/16/17   Comer, Okey Regal, MD  traMADol (ULTRAM) 50 MG tablet Take 1 tablet (50 mg total) by mouth every 6 (six) hours as needed. 12/11/17   Kinnie Feil, MD     Family History  Problem Relation Age of Onset  . Diabetes Unknown   . Hypertension Unknown   . Diabetes Mother   . Liver disease Father     Social History   Socioeconomic History  . Marital status: Single    Spouse name: Not on file  . Number of children: Not on file  . Years of education: Not on file  . Highest education level: Not on file  Occupational History  . Not on file  Social Needs  . Financial resource strain: Not on file  . Food insecurity:    Worry: Not on file    Inability: Not on file  . Transportation needs:    Medical: Not on file    Non-medical: Not on file  Tobacco Use  . Smoking status: Current Every Day Smoker    Packs/day: 1.00    Years: 40.00    Pack years: 40.00    Types: Cigarettes  . Smokeless tobacco: Never Used  Substance and Sexual Activity  . Alcohol use: Yes    Alcohol/week: 40.0 - 41.0 standard drinks    Types: 2 - 3 Glasses of wine, 24 Cans of beer, 14 Standard drinks or equivalent per week  . Drug use: No    Comment: history of iv drugs   . Sexual activity: Yes  Lifestyle  . Physical activity:    Days per week: Not on file    Minutes per session: Not on file  . Stress: Not on file  Relationships  . Social connections:    Talks on phone: Not on file    Gets together: Not on file    Attends religious  service: Not on file    Active member of club or organization: Not on file    Attends meetings of clubs or organizations: Not on file    Relationship status: Not on file  Other Topics Concern  . Not on file  Social History Narrative  . Not on file    ECOG Status: 2 - Symptomatic, <50% confined to bed  Review of Systems: A 12 point ROS discussed and pertinent positives are indicated in the HPI above.  All other systems are  negative.  Review of Systems  Constitutional: Positive for activity change and fatigue. Negative for chills, diaphoresis and fever.  HENT: Negative.   Respiratory: Negative.   Cardiovascular: Negative.   Gastrointestinal: Positive for abdominal pain and nausea. Negative for abdominal distention, blood in stool, constipation, diarrhea and vomiting.  Genitourinary: Negative.   Musculoskeletal: Negative.   Neurological: Negative.     Vital Signs: BP (!) 165/93 (BP Location: Right Arm)   Pulse 93   Temp 99 F (37.2 C) (Oral)   SpO2 98%   Physical Exam Vitals signs reviewed.  Constitutional:      General: He is not in acute distress.    Appearance: He is not toxic-appearing or diaphoretic.  Abdominal:     General: There is no distension.     Palpations: Abdomen is soft.     Tenderness: There is abdominal tenderness. There is no guarding or rebound.     Comments: Mildly tender in RUQ over liver.  Musculoskeletal:        General: No swelling.  Skin:    General: Skin is warm and dry.  Neurological:     Mental Status: He is alert and oriented to person, place, and time. Mental status is at baseline.     Imaging: Mr Abdomen Wwo Contrast  Result Date: 06/14/2018 CLINICAL DATA:  Hepatocellular carcinoma. Patient status post liver directed chemoembolization on 03/18/2018. Transcatheter embolization directed to segment 4A dominant lesion. EXAM: MRI ABDOMEN WITHOUT AND WITH CONTRAST TECHNIQUE: Multiplanar multisequence MR imaging of the abdomen was performed  both before and after the administration of intravenous contrast. CONTRAST:  7 mL Gadavist COMPARISON:  MRI 5109 FINDINGS: Lower chest:  Lung bases are clear. Hepatobiliary: Again demonstrated multiple enhancing lesions within LEFT and RIGHT hepatic lobe. Lesions are most directly comparable on the fat-sat T2 weighted series sequence (series 3). The dominant lesion in the central LEFT liver (segment 4A) measures 4.2 by 4.7 cm compared to 4.1 x 4.6 cm for no significant change in size. Three small adjacent nodules in the RIGHT hepatic lobe measure 2.0 cm, 1.9 cm and 1.8 cm (image 9/3) compared with 1.6 cm, 1.2 cm, and 0.9 cm for an increase in size on current exam. Additional example lesion in the posterior RIGHT hepatic lobe measures 2.2 x 2.5 cm increased from 1.5 x 1.5 cm. Example lesion in the LEFT lateral hepatic lobe measures 1.9 by 2.0 cm compared with 1.6 by 1.3 cm. Subcapsular lesion in the most lateral LEFT hepatic lobe measures 1.2 cm compared with 1.2 cm for no change. On the post-contrast imaging, the dominant lesion has a nonenhancing central portion with nodules along the posterior wall where previously there was more uniform central enhancement. Multiple smaller early enhancing lesions are more conspicuous than on comparison exam which may indicate new lesions. For example subcapsular lesion in the RIGHT hepatic lobe on image 63/901. This may be a function of phase of injection There is no biliary duct dilatation.  Common bile duct normal. Pancreas: Normal pancreatic parenchymal intensity. No ductal dilatation or inflammation. Spleen: Normal spleen. Adrenals/urinary tract: Adrenal glands and kidneys are normal. Stomach/Bowel: Stomach and limited of the small bowel is unremarkable Vascular/Lymphatic: Abdominal aortic normal caliber. No retroperitoneal periportal lymphadenopathy. Musculoskeletal: No aggressive osseous lesion IMPRESSION: 1. Dominant lesion in the central LEFT hepatic lobe (segment 4A) is  stable in size, decreased in central enhancement, and with persistent nodular wall enhancement. 2. Multiple additional smaller lesions within LEFT and RIGHT hepatic lobe are increased in size. 3.  Potential new lesions on the early arterial phase imaging. Electronically Signed   By: Suzy Bouchard M.D.   On: 06/14/2018 10:06   Ir Radiologist Eval & Mgmt  Result Date: 07/12/2018 Please refer to notes tab for details about interventional procedure. (Op Note)   Labs:  CBC: Recent Labs    12/23/17 0645 03/18/18 0800 03/19/18 0433 07/12/18 1454  WBC 6.5 6.4 12.4* 5.1  HGB 13.2 15.3 13.6 14.5  HCT 39.9 45.4 40.3 44.7  PLT 327 334 304 292    COAGS: Recent Labs    09/23/17 1122 12/23/17 0645 03/18/18 0800  INR 1.0 1.01 0.93  APTT  --  33 32    BMP: Recent Labs    12/09/17 1357  03/18/18 0800 03/19/18 0433 06/14/18 0839 07/12/18 1454  NA 138  --  143 136  --  142  K 4.4  --  4.2 4.1  --  4.6  CL 105  --  105 105  --  107  CO2 27  --  27 23  --  28  GLUCOSE 202*  --  143* 290*  --  85  BUN 6*  --  17 30*  --  19  CALCIUM 9.4  --  9.8 8.8*  --  9.9  CREATININE 1.10   < > 1.26* 1.16 1.30* 1.22  GFRNONAA 77  --  >60 >60  --  >60  GFRAA 90  --  >60 >60  --  >60   < > = values in this interval not displayed.    LIVER FUNCTION TESTS: Recent Labs    12/09/17 1357 03/18/18 0800 03/19/18 0433 07/12/18 1454  BILITOT 0.6 0.6 0.7 0.4  AST 92* 65* 60* 46*  ALT 93* 95* 82* 70*  ALKPHOS  --  51 51 68  PROT 6.7 7.7 6.5 7.4  ALBUMIN  --  4.1 3.5 3.7    TUMOR MARKERS: Recent Labs    12/09/17 1357  AFPTM 12.0*    Assessment and Plan:  I met with [name] and his fiance. We reviewed the MRI from 06/14/2018 that demonstrates partial necrosis of the dominant left lobe [place] after TACE with some nodular enhancement present superiorly and posteriorly.  I estimate that approximately 60-70% of the tumor is now necrotic based on non-enhancement after TACE compared to diffuse  enhancement on the 12/10/2017 MRI. Unfortunately, all other lesions in both lobes now demonstrate enlargement and there may also be some small new lesions.    He is scheduled for lab work later today at the [facility] I told [name] I would touch base with Dr. Burr Medico regarding further treatment options.  From a transcatheter liver directed perspective, there are too many lesions currently to perform TACE, and he would have to have Y-90 radioembolization in order to try to treat as much territory as possible. We will check his liver function and AFP. Performance status is fairly stable and I think he would likely tolerate separate right and left lobe Y-90 therapy.  I would like him to see Dr. Burr Medico to also discuss systemic chemotherapy should she feel it necessary and to prepare him for the need for possible future chemotherapy. Y-90 alone may not be able to control the multifocal disease in the liver.  I will check on his labs after they are done and we discussed possibly setting him up for planning arteriography with embolization and shunt calculation just after the holidays.  He is agreeable to this.   Electronically Signed:  Odile Veloso T 07/12/2018, 4:55 PM     I spent a total of 15 Minutes in face to face in clinical consultation, greater than 50% of which was counseling/coordinating care for hepatocellular carcinoma.

## 2018-07-13 ENCOUNTER — Telehealth: Payer: Self-pay | Admitting: Hematology

## 2018-07-13 LAB — AFP TUMOR MARKER: AFP, Serum, Tumor Marker: 11 ng/mL — ABNORMAL HIGH (ref 0.0–8.3)

## 2018-07-13 NOTE — Telephone Encounter (Signed)
Unable to reach patient - left message for patient to call back to set up f/u appt with Dr. Burr Medico

## 2018-07-18 ENCOUNTER — Ambulatory Visit (INDEPENDENT_AMBULATORY_CARE_PROVIDER_SITE_OTHER): Payer: Medicare Other | Admitting: Family Medicine

## 2018-07-18 ENCOUNTER — Telehealth: Payer: Self-pay

## 2018-07-18 ENCOUNTER — Encounter: Payer: Self-pay | Admitting: Family Medicine

## 2018-07-18 VITALS — BP 106/76 | HR 82 | Temp 98.9°F | Resp 16 | Ht 67.0 in | Wt 164.0 lb

## 2018-07-18 DIAGNOSIS — C220 Liver cell carcinoma: Secondary | ICD-10-CM

## 2018-07-18 DIAGNOSIS — E118 Type 2 diabetes mellitus with unspecified complications: Secondary | ICD-10-CM | POA: Diagnosis not present

## 2018-07-18 DIAGNOSIS — Z794 Long term (current) use of insulin: Secondary | ICD-10-CM | POA: Diagnosis not present

## 2018-07-18 DIAGNOSIS — E785 Hyperlipidemia, unspecified: Secondary | ICD-10-CM

## 2018-07-18 DIAGNOSIS — E119 Type 2 diabetes mellitus without complications: Secondary | ICD-10-CM

## 2018-07-18 DIAGNOSIS — G629 Polyneuropathy, unspecified: Secondary | ICD-10-CM

## 2018-07-18 DIAGNOSIS — I10 Essential (primary) hypertension: Secondary | ICD-10-CM

## 2018-07-18 LAB — POCT GLYCOSYLATED HEMOGLOBIN (HGB A1C): Hemoglobin A1C: 8.4 % — AB (ref 4.0–5.6)

## 2018-07-18 LAB — POCT URINALYSIS DIPSTICK
Blood, UA: NEGATIVE
Glucose, UA: NEGATIVE
Leukocytes, UA: NEGATIVE
Nitrite, UA: NEGATIVE
Protein, UA: POSITIVE — AB
Spec Grav, UA: 1.025 (ref 1.010–1.025)
Urobilinogen, UA: 2 E.U./dL — AB
pH, UA: 5.5 (ref 5.0–8.0)

## 2018-07-18 MED ORDER — LISINOPRIL 20 MG PO TABS: 20.0000 mg | ORAL_TABLET | Freq: Every day | ORAL | 1 refills | Status: DC

## 2018-07-18 MED ORDER — GLIPIZIDE 10 MG PO TABS: ORAL_TABLET | ORAL | 2 refills | Status: DC

## 2018-07-18 MED ORDER — INSULIN GLARGINE 100 UNIT/ML ~~LOC~~ SOLN: 30.0000 [IU] | Freq: Every day | SUBCUTANEOUS | 11 refills | Status: DC

## 2018-07-18 MED ORDER — ONDANSETRON HCL 4 MG PO TABS: ORAL_TABLET | ORAL | 0 refills | Status: DC

## 2018-07-18 MED ORDER — TRAMADOL HCL 50 MG PO TABS: 50.0000 mg | ORAL_TABLET | Freq: Three times a day (TID) | ORAL | 0 refills | Status: DC | PRN

## 2018-07-18 MED ORDER — METFORMIN HCL 1000 MG PO TABS: ORAL_TABLET | ORAL | 0 refills | Status: DC

## 2018-07-18 MED ORDER — PRAVASTATIN SODIUM 80 MG PO TABS: 80.0000 mg | ORAL_TABLET | Freq: Every day | ORAL | 1 refills | Status: DC

## 2018-07-18 MED ORDER — HYDROCHLOROTHIAZIDE 25 MG PO TABS: 25.0000 mg | ORAL_TABLET | Freq: Every day | ORAL | 1 refills | Status: DC

## 2018-07-18 MED ORDER — GABAPENTIN 300 MG PO CAPS: 300.0000 mg | ORAL_CAPSULE | Freq: Three times a day (TID) | ORAL | 1 refills | Status: DC

## 2018-07-18 NOTE — Progress Notes (Signed)
[facility]   Progress Note: General Provider: Lanae Boast, FNP  SUBJECTIVE:   [name] is a 52 y.o. male who  has a past medical history of Diabetes mellitus, Elevated cholesterol, Hepatitis C, and Hypertension.. Patient presents today for Diabetes and Hypertension Patient presents for follow up on medications. He is currently undergoing radiation treatment for hepatocellular carcinoma. Patient has been inconsistent with his follow up treatments for this and his chronic conditions. Patient states that he is having 8/10 abdominal pain. He is followed by Dr. Burr Medico in oncology and has missed or cancelled multiple appointments.  Also needs DMV handicap decal.  Review of Systems  Constitutional: Negative.   HENT: Negative.   Eyes: Negative.   Respiratory: Negative.   Cardiovascular: Negative.   Gastrointestinal: Positive for abdominal pain.  Genitourinary: Negative.   Musculoskeletal: Negative.   Skin: Negative.   Neurological: Negative.   Psychiatric/Behavioral: Negative.      OBJECTIVE: BP 106/76 (BP Location: Left Arm, Patient Position: Sitting, Cuff Size: Normal)   Pulse 82   Temp 98.9 F (37.2 C) (Oral)   Resp 16   Ht 5' 7"  (1.702 m)   Wt 164 lb (74.4 kg)   SpO2 99%   BMI 25.69 kg/m   Wt Readings from Last 3 Encounters:  07/18/18 164 lb (74.4 kg)  02/16/18 165 lb (74.8 kg)  01/05/18 168 lb (76.2 kg)     Physical Exam Vitals signs and nursing note reviewed.  Constitutional:      General: He is not in acute distress.    Appearance: He is well-developed.  HENT:     Head: Normocephalic and atraumatic.  Eyes:     Conjunctiva/sclera: Conjunctivae normal.     Pupils: Pupils are equal, round, and reactive to light.  Neck:     Musculoskeletal: Normal range of motion.  Cardiovascular:     Rate and Rhythm: Normal rate and regular rhythm.     Heart sounds: Normal heart sounds.  Pulmonary:     Effort: Pulmonary  effort is normal. No respiratory distress.     Breath sounds: Normal breath sounds.  Abdominal:     General: Bowel sounds are normal. There is no distension.     Palpations: Abdomen is soft.     Tenderness: Tenderness: right quadrant.  Musculoskeletal: Normal range of motion.  Skin:    General: Skin is warm and dry.  Neurological:     Mental Status: He is alert and oriented to person, place, and time.  Psychiatric:        Behavior: Behavior normal.        Thought Content: Thought content normal.     ASSESSMENT/PLAN:   1. Type 2 diabetes mellitus with complication, with long-term current use of insulin (HCC) Continue medications as previously prescribed. Patient has been inconsistent with use.  - HgB A1c - Urinalysis Dipstick - glipiZIDE (GLUCOTROL) 10 MG tablet; TAKE 1 TABLET(10 MG) BY MOUTH TWICE DAILY BEFORE A MEAL  Dispense: 180 tablet; Refill: 2 - insulin glargine (LANTUS) 100 UNIT/ML injection; Inject 0.3 mLs (30 Units total) into the skin at bedtime.  Dispense: 10 mL; Refill: 11 - metFORMIN (GLUCOPHAGE) 1000 MG tablet; TAKE 1 TABLET(1000 MG) BY MOUTH TWICE DAILY  Dispense: 180 tablet; Refill: 0  2. Essential hypertension - hydrochlorothiazide (HYDRODIURIL) 25 MG tablet; Take 1 tablet (25 mg total) by mouth daily.  Dispense: 90 tablet; Refill: 1 - lisinopril (PRINIVIL,ZESTRIL) 20 MG tablet; Take 1 tablet (20  mg total) by mouth daily.  Dispense: 90 tablet; Refill: 1  3. Hepatocellular carcinoma (HCC) - ondansetron (ZOFRAN) 4 MG tablet; TAKE 1 TABLET(4 MG) BY MOUTH EVERY 8 HOURS AS NEEDED FOR NAUSEA OR VOMITING  Dispense: 30 tablet; Refill: 0 - traMADol (ULTRAM) 50 MG tablet; Take 1 tablet (50 mg total) by mouth every 8 (eight) hours as needed.  Dispense: 30 tablet; Refill: 0  4. Neuropathy - gabapentin (NEURONTIN) 300 MG capsule; Take 1 capsule (300 mg total) by mouth 3 (three) times daily.  Dispense: 270 capsule; Refill: 1  5. Hyperlipidemia, unspecified hyperlipidemia  type - pravastatin (PRAVACHOL) 80 MG tablet; Take 1 tablet (80 mg total) by mouth daily.  Dispense: 90 tablet; Refill: 1       The patient was given clear instructions to go to ER or return to medical center if symptoms do not improve, worsen or new problems develop. The patient verbalized understanding and agreed with plan of care.   Ms. Doug Sou. Nathaneil Canary, FNP-BC [facility] [address] [place], [place] [address] [phone]     This note has been created with Dragon speech recognition software and smart phrase technology. Any transcriptional errors are unintentional.

## 2018-07-18 NOTE — Patient Instructions (Signed)
Diabetes Mellitus and Nutrition When you have diabetes (diabetes mellitus), it is very important to have healthy eating habits because your blood sugar (glucose) levels are greatly affected by what you eat and drink. Eating healthy foods in the appropriate amounts, at about the same times every day, can help you:  Control your blood glucose.  Lower your risk of heart disease.  Improve your blood pressure.  Reach or maintain a healthy weight.  Every person with diabetes is different, and each person has different needs for a meal plan. Your health care provider may recommend that you work with a diet and nutrition specialist (dietitian) to make a meal plan that is best for you. Your meal plan may vary depending on factors such as:  The calories you need.  The medicines you take.  Your weight.  Your blood glucose, blood pressure, and cholesterol levels.  Your activity level.  Other health conditions you have, such as heart or kidney disease.  How do carbohydrates affect me? Carbohydrates affect your blood glucose level more than any other type of food. Eating carbohydrates naturally increases the amount of glucose in your blood. Carbohydrate counting is a method for keeping track of how many carbohydrates you eat. Counting carbohydrates is important to keep your blood glucose at a healthy level, especially if you use insulin or take certain oral diabetes medicines. It is important to know how many carbohydrates you can safely have in each meal. This is different for every person. Your dietitian can help you calculate how many carbohydrates you should have at each meal and for snack. Foods that contain carbohydrates include:  Bread, cereal, rice, pasta, and crackers.  Potatoes and corn.  Peas, beans, and lentils.  Milk and yogurt.  Fruit and juice.  Desserts, such as cakes, cookies, ice cream, and candy.  How does alcohol affect me? Alcohol can cause a sudden decrease in blood  glucose (hypoglycemia), especially if you use insulin or take certain oral diabetes medicines. Hypoglycemia can be a life-threatening condition. Symptoms of hypoglycemia (sleepiness, dizziness, and confusion) are similar to symptoms of having too much alcohol. If your health care provider says that alcohol is safe for you, follow these guidelines:  Limit alcohol intake to no more than 1 drink per day for nonpregnant women and 2 drinks per day for men. One drink equals 12 oz of beer, 5 oz of wine, or 1 oz of hard liquor.  Do not drink on an empty stomach.  Keep yourself hydrated with water, diet soda, or unsweetened iced tea.  Keep in mind that regular soda, juice, and other mixers may contain a lot of sugar and must be counted as carbohydrates.  What are tips for following this plan? Reading food labels  Start by checking the serving size on the label. The amount of calories, carbohydrates, fats, and other nutrients listed on the label are based on one serving of the food. Many foods contain more than one serving per package.  Check the total grams (g) of carbohydrates in one serving. You can calculate the number of servings of carbohydrates in one serving by dividing the total carbohydrates by 15. For example, if a food has 30 g of total carbohydrates, it would be equal to 2 servings of carbohydrates.  Check the number of grams (g) of saturated and trans fats in one serving. Choose foods that have low or no amount of these fats.  Check the number of milligrams (mg) of sodium in one serving. Most people   should limit total sodium intake to less than 2,300 mg per day.  Always check the nutrition information of foods labeled as "low-fat" or "nonfat". These foods may be higher in added sugar or refined carbohydrates and should be avoided.  Talk to your dietitian to identify your daily goals for nutrients listed on the label. Shopping  Avoid buying canned, premade, or processed foods. These  foods tend to be high in fat, sodium, and added sugar.  Shop around the outside edge of the grocery store. This includes fresh fruits and vegetables, bulk grains, fresh meats, and fresh dairy. Cooking  Use low-heat cooking methods, such as baking, instead of high-heat cooking methods like deep frying.  Cook using healthy oils, such as olive, canola, or sunflower oil.  Avoid cooking with butter, cream, or high-fat meats. Meal planning  Eat meals and snacks regularly, preferably at the same times every day. Avoid going long periods of time without eating.  Eat foods high in fiber, such as fresh fruits, vegetables, beans, and whole grains. Talk to your dietitian about how many servings of carbohydrates you can eat at each meal.  Eat 4-6 ounces of lean protein each day, such as lean meat, chicken, fish, eggs, or tofu. 1 ounce is equal to 1 ounce of meat, chicken, or fish, 1 egg, or 1/4 cup of tofu.  Eat some foods each day that contain healthy fats, such as avocado, nuts, seeds, and fish. Lifestyle   Check your blood glucose regularly.  Exercise at least 30 minutes 5 or more days each week, or as told by your health care provider.  Take medicines as told by your health care provider.  Do not use any products that contain nicotine or tobacco, such as cigarettes and e-cigarettes. If you need help quitting, ask your health care provider.  Work with a counselor or diabetes educator to identify strategies to manage stress and any emotional and social challenges. What are some questions to ask my health care provider?  Do I need to meet with a diabetes educator?  Do I need to meet with a dietitian?  What number can I call if I have questions?  When are the best times to check my blood glucose? Where to find more information:  American Diabetes Association: diabetes.org/food-and-fitness/food  Academy of Nutrition and Dietetics:  www.eatright.org/resources/health/diseases-and-conditions/diabetes  National Institute of Diabetes and Digestive and Kidney Diseases (NIH): www.niddk.nih.gov/health-information/diabetes/overview/diet-eating-physical-activity Summary  A healthy meal plan will help you control your blood glucose and maintain a healthy lifestyle.  Working with a diet and nutrition specialist (dietitian) can help you make a meal plan that is best for you.  Keep in mind that carbohydrates and alcohol have immediate effects on your blood glucose levels. It is important to count carbohydrates and to use alcohol carefully. This information is not intended to replace advice given to you by your health care provider. Make sure you discuss any questions you have with your health care provider. Document Released: 04/16/2005 Document Revised: 08/24/2016 Document Reviewed: 08/24/2016 Elsevier Interactive Patient Education  2018 Elsevier Inc.  

## 2018-07-18 NOTE — Telephone Encounter (Signed)
I called back, and will schedule him to see me at 3:30pm on this Friday 12/20. Pt agrees with the plan.   Truitt Merle MD

## 2018-07-18 NOTE — Telephone Encounter (Signed)
Received call from patient's significant other stating they had blood work last week and MRI, saw Dr. Kathlene Cote and was told to get back in with Dr. Burr Medico to decide what procedure would be best for him.  Return [phone]

## 2018-07-20 ENCOUNTER — Other Ambulatory Visit: Payer: Self-pay

## 2018-07-20 NOTE — Patient Outreach (Signed)
[facility]) Care Management  07/20/2018  Mckay Tegtmeyer Statz 06/29/1966 [id number]   Medication Adherence call to Mr. Dakoda Nickey left a message for patient to call back patient is showing past due on Pravastatin 80 mg, [facility] said there is a prescription Ready for patient to pick up. Mr. Kreeger is showing past due under [facility].    [facility] Management Direct Dial [phone]  Fax [phone] [email or web address]

## 2018-07-22 ENCOUNTER — Ambulatory Visit (HOSPITAL_COMMUNITY)
Admission: EM | Admit: 2018-07-22 | Discharge: 2018-07-22 | Disposition: A | Discharge status: Home / Self Care | Payer: Medicare Other | Attending: Internal Medicine | Admitting: Internal Medicine

## 2018-07-22 ENCOUNTER — Encounter (HOSPITAL_COMMUNITY): Payer: Self-pay

## 2018-07-22 ENCOUNTER — Encounter: Payer: Self-pay | Admitting: Hematology

## 2018-07-22 ENCOUNTER — Inpatient Hospital Stay (HOSPITAL_BASED_OUTPATIENT_CLINIC_OR_DEPARTMENT_OTHER): Payer: Medicare Other | Admitting: Hematology

## 2018-07-22 ENCOUNTER — Telehealth: Payer: Self-pay | Admitting: Hematology

## 2018-07-22 VITALS — BP 146/88 | HR 89 | Temp 99.5°F | Resp 16 | Ht 67.0 in | Wt 162.8 lb

## 2018-07-22 DIAGNOSIS — C220 Liver cell carcinoma: Secondary | ICD-10-CM

## 2018-07-22 DIAGNOSIS — K5900 Constipation, unspecified: Secondary | ICD-10-CM | POA: Diagnosis not present

## 2018-07-22 DIAGNOSIS — I10 Essential (primary) hypertension: Secondary | ICD-10-CM

## 2018-07-22 DIAGNOSIS — E119 Type 2 diabetes mellitus without complications: Secondary | ICD-10-CM

## 2018-07-22 DIAGNOSIS — M549 Dorsalgia, unspecified: Secondary | ICD-10-CM

## 2018-07-22 DIAGNOSIS — Z794 Long term (current) use of insulin: Secondary | ICD-10-CM

## 2018-07-22 DIAGNOSIS — M542 Cervicalgia: Secondary | ICD-10-CM

## 2018-07-22 DIAGNOSIS — M62838 Other muscle spasm: Secondary | ICD-10-CM

## 2018-07-22 DIAGNOSIS — D490 Neoplasm of unspecified behavior of digestive system: Secondary | ICD-10-CM

## 2018-07-22 DIAGNOSIS — F17210 Nicotine dependence, cigarettes, uncomplicated: Secondary | ICD-10-CM

## 2018-07-22 DIAGNOSIS — Z79899 Other long term (current) drug therapy: Secondary | ICD-10-CM

## 2018-07-22 DIAGNOSIS — B1920 Unspecified viral hepatitis C without hepatic coma: Secondary | ICD-10-CM

## 2018-07-22 MED ORDER — DICLOFENAC SODIUM 75 MG PO TBEC: 75.0000 mg | DELAYED_RELEASE_TABLET | Freq: Two times a day (BID) | ORAL | 0 refills | Status: DC | PRN

## 2018-07-22 MED ORDER — CYCLOBENZAPRINE HCL 10 MG PO TABS: ORAL_TABLET | ORAL | 0 refills | Status: DC

## 2018-07-22 NOTE — Discharge Instructions (Signed)
Recommend start Voltaren 59m twice a day as needed for muscle pain. May also take Flexeril 164m1/2 to 1 whole tablet by mouth every 8 hours as needed for muscle spasms- can cause drowsiness. Recommend apply warm moist heat to area for comfort. Follow-up with your PCP in 4 to 5 days if not improving.

## 2018-07-22 NOTE — Telephone Encounter (Signed)
Printed calendar and avs.  Scheduled CT scan.
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
## 2018-08-03 DIAGNOSIS — Z8719 Personal history of other diseases of the digestive system: Secondary | ICD-10-CM

## 2018-08-03 HISTORY — DX: Personal history of other diseases of the digestive system: Z87.19

## 2018-08-08 ENCOUNTER — Ambulatory Visit (HOSPITAL_COMMUNITY): Payer: Medicare Other

## 2018-08-11 ENCOUNTER — Other Ambulatory Visit (HOSPITAL_COMMUNITY): Payer: Self-pay | Admitting: Interventional Radiology

## 2018-08-11 DIAGNOSIS — C220 Liver cell carcinoma: Secondary | ICD-10-CM

## 2018-08-16 ENCOUNTER — Ambulatory Visit (HOSPITAL_COMMUNITY)
Admission: RE | Admit: 2018-08-16 | Discharge: 2018-08-16 | Disposition: A | Discharge status: Home / Self Care | Payer: Medicare Other | Source: Ambulatory Visit | Attending: Hematology | Admitting: Hematology

## 2018-08-16 DIAGNOSIS — D490 Neoplasm of unspecified behavior of digestive system: Secondary | ICD-10-CM | POA: Diagnosis not present

## 2018-08-16 DIAGNOSIS — J439 Emphysema, unspecified: Secondary | ICD-10-CM | POA: Diagnosis not present

## 2018-08-18 ENCOUNTER — Telehealth: Payer: Self-pay

## 2018-08-18 DIAGNOSIS — C220 Liver cell carcinoma: Secondary | ICD-10-CM

## 2018-08-18 MED ORDER — ONDANSETRON HCL 4 MG PO TABS: ORAL_TABLET | ORAL | 0 refills | Status: DC

## 2018-08-18 NOTE — Telephone Encounter (Signed)
Refill for Zofran sent into pharmacy. Thanks!

## 2018-08-23 ENCOUNTER — Telehealth: Payer: Self-pay

## 2018-08-23 NOTE — Telephone Encounter (Signed)
Left voice message for patient that CT of Chest was negative, no concerns per Dr. Burr Medico.

## 2018-08-23 NOTE — Telephone Encounter (Signed)
-----   Message from Truitt Merle, MD sent at 08/19/2018 11:28 PM EST ----- Please let pt know the CT chest results, no concerns, thanks   Truitt Merle  08/19/2018

## 2018-08-25 ENCOUNTER — Other Ambulatory Visit: Payer: Self-pay | Admitting: Student

## 2018-08-26 ENCOUNTER — Ambulatory Visit (HOSPITAL_COMMUNITY)
Admission: RE | Admit: 2018-08-26 | Discharge: 2018-08-26 | Disposition: A | Discharge status: Home / Self Care | Payer: Medicare Other | Source: Ambulatory Visit | Attending: Interventional Radiology | Admitting: Interventional Radiology

## 2018-08-26 ENCOUNTER — Other Ambulatory Visit (HOSPITAL_COMMUNITY): Payer: Self-pay | Admitting: Interventional Radiology

## 2018-08-26 ENCOUNTER — Other Ambulatory Visit: Payer: Self-pay

## 2018-08-26 ENCOUNTER — Encounter (HOSPITAL_COMMUNITY): Payer: Self-pay

## 2018-08-26 DIAGNOSIS — E7800 Pure hypercholesterolemia, unspecified: Secondary | ICD-10-CM | POA: Diagnosis not present

## 2018-08-26 DIAGNOSIS — F17210 Nicotine dependence, cigarettes, uncomplicated: Secondary | ICD-10-CM | POA: Insufficient documentation

## 2018-08-26 DIAGNOSIS — Z79899 Other long term (current) drug therapy: Secondary | ICD-10-CM | POA: Insufficient documentation

## 2018-08-26 DIAGNOSIS — I10 Essential (primary) hypertension: Secondary | ICD-10-CM | POA: Diagnosis not present

## 2018-08-26 DIAGNOSIS — C220 Liver cell carcinoma: Secondary | ICD-10-CM

## 2018-08-26 DIAGNOSIS — E119 Type 2 diabetes mellitus without complications: Secondary | ICD-10-CM | POA: Diagnosis not present

## 2018-08-26 DIAGNOSIS — B1920 Unspecified viral hepatitis C without hepatic coma: Secondary | ICD-10-CM | POA: Insufficient documentation

## 2018-08-26 DIAGNOSIS — Z8249 Family history of ischemic heart disease and other diseases of the circulatory system: Secondary | ICD-10-CM | POA: Diagnosis not present

## 2018-08-26 DIAGNOSIS — Z888 Allergy status to other drugs, medicaments and biological substances status: Secondary | ICD-10-CM | POA: Diagnosis not present

## 2018-08-26 DIAGNOSIS — Z794 Long term (current) use of insulin: Secondary | ICD-10-CM | POA: Insufficient documentation

## 2018-08-26 DIAGNOSIS — Z833 Family history of diabetes mellitus: Secondary | ICD-10-CM | POA: Insufficient documentation

## 2018-08-26 HISTORY — DX: Chest pain, unspecified: R07.9

## 2018-08-26 HISTORY — PX: IR ANGIOGRAM SELECTIVE EACH ADDITIONAL VESSEL: IMG667

## 2018-08-26 HISTORY — PX: IR US GUIDE VASC ACCESS RIGHT: IMG2390

## 2018-08-26 HISTORY — PX: IR EMBO ARTERIAL NOT HEMORR HEMANG INC GUIDE ROADMAPPING: IMG5448

## 2018-08-26 HISTORY — DX: Dyspnea, unspecified: R06.00

## 2018-08-26 HISTORY — PX: IR ANGIOGRAM VISCERAL SELECTIVE: IMG657

## 2018-08-26 LAB — CBC WITH DIFFERENTIAL/PLATELET
Abs Immature Granulocytes: 0.03 10*3/uL (ref 0.00–0.07)
BASOS PCT: 0 %
Basophils Absolute: 0 10*3/uL (ref 0.0–0.1)
Eosinophils Absolute: 0.1 10*3/uL (ref 0.0–0.5)
Eosinophils Relative: 2 %
HCT: 46 % (ref 39.0–52.0)
Hemoglobin: 14.7 g/dL (ref 13.0–17.0)
Immature Granulocytes: 1 %
Lymphocytes Relative: 48 %
Lymphs Abs: 2.5 10*3/uL (ref 0.7–4.0)
MCH: 28.7 pg (ref 26.0–34.0)
MCHC: 32 g/dL (ref 30.0–36.0)
MCV: 89.8 fL (ref 80.0–100.0)
Monocytes Absolute: 0.4 10*3/uL (ref 0.1–1.0)
Monocytes Relative: 8 %
Neutro Abs: 2.2 10*3/uL (ref 1.7–7.7)
Neutrophils Relative %: 41 %
Platelets: 253 10*3/uL (ref 150–400)
RBC: 5.12 MIL/uL (ref 4.22–5.81)
RDW: 13.1 % (ref 11.5–15.5)
WBC: 5.3 10*3/uL (ref 4.0–10.5)
nRBC: 0 % (ref 0.0–0.2)

## 2018-08-26 LAB — COMPREHENSIVE METABOLIC PANEL
ALT: 118 U/L — ABNORMAL HIGH (ref 0–44)
AST: 94 U/L — ABNORMAL HIGH (ref 15–41)
Albumin: 4.3 g/dL (ref 3.5–5.0)
Alkaline Phosphatase: 54 U/L (ref 38–126)
Anion gap: 11 (ref 5–15)
BILIRUBIN TOTAL: 0.7 mg/dL (ref 0.3–1.2)
BUN: 8 mg/dL (ref 6–20)
CO2: 27 mmol/L (ref 22–32)
CREATININE: 0.99 mg/dL (ref 0.61–1.24)
Calcium: 9.5 mg/dL (ref 8.9–10.3)
Chloride: 97 mmol/L — ABNORMAL LOW (ref 98–111)
GFR calc Af Amer: >60 mL/min (ref >60)
GFR calc non Af Amer: >60 mL/min (ref >60)
Glucose, Bld: 111 mg/dL — ABNORMAL HIGH (ref 70–99)
Potassium: 3.3 mmol/L — ABNORMAL LOW (ref 3.5–5.1)
SODIUM: 135 mmol/L (ref 135–145)
TOTAL PROTEIN: 8 g/dL (ref 6.5–8.1)

## 2018-08-26 LAB — PROTIME-INR
INR: 0.94
Prothrombin Time: 12.5 seconds (ref 11.4–15.2)

## 2018-08-26 LAB — GLUCOSE, CAPILLARY: Glucose-Capillary: 106 mg/dL — ABNORMAL HIGH (ref 70–99)

## 2018-08-26 MED ORDER — ONDANSETRON HCL 4 MG/2ML IJ SOLN: 4.0000 mg | Freq: Four times a day (QID) | INTRAMUSCULAR | Status: DC

## 2018-08-26 MED ORDER — MIDAZOLAM HCL 2 MG/2ML IJ SOLN
INTRAMUSCULAR | Status: AC | PRN
  Administered 2018-08-26 (×2): 1 mg via INTRAVENOUS
  Administered 2018-08-26: 0.5 mg via INTRAVENOUS
  Administered 2018-08-26: 1 mg via INTRAVENOUS
  Administered 2018-08-26: 0.5 mg via INTRAVENOUS
  Administered 2018-08-26 (×5): 1 mg via INTRAVENOUS

## 2018-08-26 MED ORDER — TECHNETIUM TO 99M ALBUMIN AGGREGATED
4.9000 | Freq: Once | INTRAVENOUS | Status: AC
  Administered 2018-08-26: 4.9 via INTRAVENOUS

## 2018-08-26 MED ORDER — IOHEXOL 300 MG/ML  SOLN
100.0000 mL | Freq: Once | INTRAMUSCULAR | Status: AC | PRN
  Administered 2018-08-26: 80 mL via INTRA_ARTERIAL

## 2018-08-26 MED ORDER — FENTANYL CITRATE (PF) 100 MCG/2ML IJ SOLN
INTRAMUSCULAR | Status: AC | PRN
  Administered 2018-08-26 (×2): 50 ug via INTRAVENOUS
  Administered 2018-08-26: 25 ug via INTRAVENOUS
  Administered 2018-08-26: 50 ug via INTRAVENOUS
  Administered 2018-08-26: 25 ug via INTRAVENOUS

## 2018-08-26 MED ORDER — SODIUM CHLORIDE 0.9 % IV SOLN
INTRAVENOUS | Status: DC
  Administered 2018-08-26 (×2): via INTRAVENOUS

## 2018-08-26 MED ORDER — LIDOCAINE HCL 1 % IJ SOLN
INTRAMUSCULAR | Status: AC
  Filled 2018-08-26: qty 20

## 2018-08-26 MED ORDER — IOPAMIDOL (ISOVUE-300) INJECTION 61%
INTRAVENOUS | Status: AC
  Filled 2018-08-26: qty 150

## 2018-08-26 MED ORDER — MIDAZOLAM HCL 2 MG/2ML IJ SOLN
INTRAMUSCULAR | Status: AC
  Filled 2018-08-26: qty 2

## 2018-08-26 MED ORDER — CEFAZOLIN SODIUM-DEXTROSE 2-4 GM/100ML-% IV SOLN
2.0000 g | Freq: Once | INTRAVENOUS | Status: AC
  Administered 2018-08-26: 2000 mg via INTRAVENOUS

## 2018-08-26 MED ORDER — HYDROCODONE-ACETAMINOPHEN 5-325 MG PO TABS
1.0000 | ORAL_TABLET | ORAL | Status: DC | PRN
  Administered 2018-08-26: 1 via ORAL
  Filled 2018-08-26: qty 1

## 2018-08-26 MED ORDER — MIDAZOLAM HCL 2 MG/2ML IJ SOLN
INTRAMUSCULAR | Status: AC
  Filled 2018-08-26: qty 6

## 2018-08-26 MED ORDER — IOHEXOL 300 MG/ML  SOLN
100.0000 mL | Freq: Once | INTRAMUSCULAR | Status: AC | PRN
  Administered 2018-08-26: 20 mL via INTRA_ARTERIAL

## 2018-08-26 MED ORDER — IOHEXOL 300 MG/ML  SOLN
100.0000 mL | Freq: Once | INTRAMUSCULAR | Status: AC | PRN
  Administered 2018-08-26: 25 mL via INTRA_ARTERIAL

## 2018-08-26 MED ORDER — FENTANYL CITRATE (PF) 100 MCG/2ML IJ SOLN
INTRAMUSCULAR | Status: AC
  Filled 2018-08-26: qty 4

## 2018-08-26 MED ORDER — CEFAZOLIN SODIUM-DEXTROSE 2-4 GM/100ML-% IV SOLN
INTRAVENOUS | Status: AC
  Administered 2018-08-26: 2000 mg via INTRAVENOUS
  Filled 2018-08-26: qty 100

## 2018-08-26 NOTE — Procedures (Signed)
Interventional Radiology Procedure Note  Procedure: Hepatic arteriography with embolization of gastroduodenal artery and left gastric artery branch. MAA injection in right hepatic artery.  Complications: None  Estimated Blood Loss: < 10 mL  Findings: Celiac and common hepatic artery supplies medial segment left hepatic artery. Abnormal tumor supply and vascularity in medial segment of left lobe. GDA embolized with Interlock coils.  Left gastric artery trunk supplies lateral segment left hepatic artery. Abnormal foci of enhancing tumor in lateral segment of left lobe. Left gastric artery branch supplying stomach embolized with Interlock coils.  Right hepatic artery is replaced off proximal SMA. Selective arteriography performed demonstrating multiple enhancing tumor masses. MAA injected in right hepatic artery for NM shunt calculation to lungs to follow.  Plan: NM imaging to follow. Total of 4 hours bedrest.  [name], M.D Pager:  [phone]

## 2018-08-26 NOTE — H&P (Signed)
Chief Complaint: Hepatocellular carcinoma  Referring Physician(s): Yamagata,Glenn  Supervising Physician: Aletta Edouard  Patient Status: [facility] - Out-pt  History of Present Illness: [name] is a 53 y.o. male who is status post DEB-TACE on 03/18/2018 to target a dominant left lobe HCC.    He did have some post embolic pain after the procedure but overall tolerated the procedure well.    He missed several postprocedural follow-up appointments in September and October.   MRI from 06/14/2018 that demonstrated partial necrosis of the dominant left lobe HCC after TACE with some nodular enhancement present superiorly and posteriorly.    Approximately 60-70% of the tumor is now necrotic based on non-enhancement after TACE compared to diffuse enhancement on the 12/10/2017 MRI.   Unfortunately, all other lesions in both lobes now demonstrate enlargement and there may also be some small new lesions.    He saw Dr. Kathlene Cote in clinic on 07/12/18 to discuss options.  Dr. Margaretmary Dys not states = "From a transcatheter liver directed perspective, there are too many lesions currently to perform TACE, and he would have to have Y-90 radioembolization in order to try to treat as much territory as possible."  He is NPO. He does have some RUQ pain today. No nausea/vomiting. No Fever/chills. ROS negative.   Past Medical History:  Diagnosis Date  . Chest pain    pt states when he turned over on other side aided in relief  . Diabetes mellitus   . Dyspnea    increased activity  . Elevated cholesterol   . Hepatitis C   . Hypertension     Past Surgical History:  Procedure Laterality Date  . I&D EXTREMITY  06/28/2011   Procedure: IRRIGATION AND DEBRIDEMENT EXTREMITY;  Surgeon: Linna Hoff;  Location: [place];  Service: Orthopedics;  Laterality: Left;  with application of wound vac  . INCISION AND DRAINAGE OF WOUND  07/02/2011   Procedure: IRRIGATION AND DEBRIDEMENT WOUND;  Surgeon: Linna Hoff;  Location: [place];  Service: Orthopedics;  Laterality: Left;  Irrigation and Debridement of left arm wound with  Skin Grafting from left thigh   . IR ANGIOGRAM SELECTIVE EACH ADDITIONAL VESSEL  03/18/2018  . IR ANGIOGRAM VISCERAL SELECTIVE  03/18/2018  . IR ANGIOGRAM VISCERAL SELECTIVE  03/18/2018  . IR EMBO TUMOR ORGAN ISCHEMIA INFARCT INC GUIDE ROADMAPPING  03/18/2018  . IR RADIOLOGIST EVAL & MGMT  02/16/2018  . IR RADIOLOGIST EVAL & MGMT  07/12/2018  . IR US GUIDE VASC ACCESS RIGHT  03/18/2018  . JOINT REPLACEMENT    . PARTIAL HIP ARTHROPLASTY      Allergies: Gadolinium derivatives and Multihance [gadobenate]  Medications: Prior to Admission medications   Medication Sig Start Date End Date Taking? Authorizing Provider  glipiZIDE (GLUCOTROL) 10 MG tablet TAKE 1 TABLET(10 MG) BY MOUTH TWICE DAILY BEFORE A MEAL 07/18/18  Yes Lanae Boast, FNP  hydrochlorothiazide (HYDRODIURIL) 25 MG tablet Take 1 tablet (25 mg total) by mouth daily. 07/18/18  Yes Lanae Boast, FNP  insulin glargine (LANTUS) 100 UNIT/ML injection Inject 0.3 mLs (30 Units total) into the skin at bedtime. 07/18/18  Yes Lanae Boast, FNP  lisinopril (PRINIVIL,ZESTRIL) 20 MG tablet Take 1 tablet (20 mg total) by mouth daily. 07/18/18  Yes Lanae Boast, FNP  metFORMIN (GLUCOPHAGE) 1000 MG tablet TAKE 1 TABLET(1000 MG) BY MOUTH TWICE DAILY 07/18/18  Yes Lanae Boast, FNP  ondansetron (ZOFRAN) 4 MG tablet TAKE 1 TABLET(4 MG) BY MOUTH EVERY 8 HOURS AS NEEDED  FOR NAUSEA OR VOMITING 08/18/18  Yes Lanae Boast, FNP  pravastatin (PRAVACHOL) 80 MG tablet Take 1 tablet (80 mg total) by mouth daily. 07/18/18  Yes Lanae Boast, FNP  cyclobenzaprine (FLEXERIL) 10 MG tablet Take 1/2 to 1 whole tablet by mouth every 8 hours as needed for muscle spasms. 07/22/18   Katy Apo, NP  diclofenac (VOLTAREN) 75 MG EC tablet Take 1 tablet (75 mg total) by mouth 2 (two) times daily as needed for moderate pain. 07/22/18   Katy Apo, NP  gabapentin (NEURONTIN) 300 MG capsule Take 1 capsule (300 mg total) by mouth 3 (three) times daily. 07/18/18   Lanae Boast, FNP  glucose blood test strip Check blood sugar 3 times per day prior to meals. Use as instructed 12/22/17   Dorena Dew, FNP  senna (SENOKOT) 8.6 MG tablet Take 1 tablet by mouth as needed for constipation.    [provider]  Sofosbuvir-Velpatasvir (EPCLUSA) 400-100 MG TABS Take 1 tablet by mouth daily. 11/16/17   Comer, Okey Regal, MD  traMADol (ULTRAM) 50 MG tablet Take 1 tablet (50 mg total) by mouth every 8 (eight) hours as needed. 07/18/18   Lanae Boast, FNP     Family History  Problem Relation Age of Onset  . Diabetes Other   . Hypertension Other   . Diabetes Mother   . Liver disease Father     Social History   Socioeconomic History  . Marital status: Single    Spouse name: Not on file  . Number of children: Not on file  . Years of education: Not on file  . Highest education level: Not on file  Occupational History  . Not on file  Social Needs  . Financial resource strain: Not on file  . Food insecurity:    Worry: Not on file    Inability: Not on file  . Transportation needs:    Medical: Not on file    Non-medical: Not on file  Tobacco Use  . Smoking status: Current Every Day Smoker    Packs/day: 1.00    Years: 40.00    Pack years: 40.00    Types: Cigarettes  . Smokeless tobacco: Never Used  Substance and Sexual Activity  . Alcohol use: Yes    Alcohol/week: 40.0 - 41.0 standard drinks    Types: 2 - 3 Glasses of wine, 24 Cans of beer, 14 Standard drinks or equivalent per week    Comment: last drink yesterday 08/25/2018 2 40 oz beers  . Drug use: No    Comment: history of iv drugs   . Sexual activity: Yes  Lifestyle  . Physical activity:    Days per week: Not on file    Minutes per session: Not on file  . Stress: Not on file  Relationships  . Social connections:    Talks on phone: Not on file    Gets  together: Not on file    Attends religious service: Not on file    Active member of club or organization: Not on file    Attends meetings of clubs or organizations: Not on file    Relationship status: Not on file  Other Topics Concern  . Not on file  Social History Narrative  . Not on file     Review of Systems: A 12 point ROS discussed and pertinent positives are indicated in the HPI above.  All other systems are negative.  Review of Systems  Vital Signs: BP (!) 130/93 (  BP Location: Right Arm)   Pulse 88   Temp 98.4 F (36.9 C) (Oral)   Resp 16   SpO2 98%   Physical Exam Vitals signs reviewed.  Constitutional:      Appearance: Normal appearance.  HENT:     Head: Normocephalic and atraumatic.  Eyes:     Extraocular Movements: Extraocular movements intact.  Neck:     Musculoskeletal: Normal range of motion.  Cardiovascular:     Rate and Rhythm: Normal rate and regular rhythm.  Pulmonary:     Effort: Pulmonary effort is normal.     Breath sounds: Normal breath sounds.  Abdominal:     Palpations: Abdomen is soft.     Tenderness: There is abdominal tenderness.  Musculoskeletal: Normal range of motion.  Skin:    General: Skin is warm and dry.  Neurological:     General: No focal deficit present.     Mental Status: He is alert and oriented to person, place, and time.  Psychiatric:        Mood and Affect: Mood normal.        Behavior: Behavior normal.        Thought Content: Thought content normal.        Judgment: Judgment normal.     Imaging: Ct Chest Wo Contrast  Result Date: 08/16/2018 CLINICAL DATA:  Liver cancer EXAM: CT CHEST WITHOUT CONTRAST TECHNIQUE: Multidetector CT imaging of the chest was performed following the standard protocol without IV contrast. COMPARISON:  PET-CT dated 12/22/2017. Partial comparison to MR abdomen dated 06/14/2018. FINDINGS: Cardiovascular: Heart is normal in size.  No pericardial effusion. No evidence of thoracic aortic  aneurysm. Atherosclerotic calcifications of the aortic arch. Mild coronary atherosclerosis of the LAD and right coronary artery. Mediastinum/Nodes: No suspicious mediastinal lymphadenopathy. Visualized thyroid is unremarkable. Lungs/Pleura: Mild centrilobular emphysematous changes. Mild linear scarring/atelectasis in the left lower lobe. No suspicious pulmonary nodules. No focal consolidation. No pleural effusion or pneumothorax. Upper Abdomen: Multifocal hepatic masses, including a dominant 4.6 cm lesion in segment 4A, previously 4.7 cm. Overall appearance is similar, although poorly evaluated. Musculoskeletal: Visualized osseous structures are within normal limits. IMPRESSION: Multifocal hepatic masses in this patient with known liver cancer, similar to prior MRI, although poorly evaluated. No evidence of metastatic disease in the chest. Aortic Atherosclerosis (ICD10-I70.0) and Emphysema (ICD10-J43.9). Electronically Signed   By: Julian Hy M.D.   On: 08/16/2018 19:42    Labs:  CBC: Recent Labs    03/18/18 0800 03/19/18 0433 07/12/18 1454 08/26/18 0825  WBC 6.4 12.4* 5.1 5.3  HGB 15.3 13.6 14.5 14.7  HCT 45.4 40.3 44.7 46.0  PLT 334 304 292 253    COAGS: Recent Labs    09/23/17 1122 12/23/17 0645 03/18/18 0800 08/26/18 0825  INR 1.0 1.01 0.93 0.94  APTT  --  33 32  --     BMP: Recent Labs    03/18/18 0800 03/19/18 0433 06/14/18 0839 07/12/18 1454 08/26/18 0825  NA 143 136  --  142 135  K 4.2 4.1  --  4.6 3.3*  CL 105 105  --  107 97*  CO2 27 23  --  28 27  GLUCOSE 143* 290*  --  85 111*  BUN 17 30*  --  19 8  CALCIUM 9.8 8.8*  --  9.9 9.5  CREATININE 1.26* 1.16 1.30* 1.22 0.99  GFRNONAA >60 >60  --  >60 >60  GFRAA >60 >60  --  >60 >60    LIVER  FUNCTION TESTS: Recent Labs    03/18/18 0800 03/19/18 0433 07/12/18 1454 08/26/18 0825  BILITOT 0.6 0.7 0.4 0.7  AST 65* 60* 46* 94*  ALT 95* 82* 70* 118*  ALKPHOS 51 51 68 54  PROT 7.7 6.5 7.4 8.0  ALBUMIN  4.1 3.5 3.7 4.3    TUMOR MARKERS: Recent Labs    12/09/17 1357  AFPTM 12.0*    Assessment and Plan:  Hepatocellular carcinoma with previous chemoembolization back in August of 2019.  Will proceed with pre- Y90 mapping today by Dr. Kathlene Cote with follow NM scanning.  He is scheduled for Y90 on Feb. 11.  Risks and benefits discussed with the patient including, but not limited to bleeding, infection, vascular injury, post procedural pain, nausea, vomiting and fatigue, contrast induced renal failure, liver failure, radiation injury to the bowel, radiation induced cholecystitis, neutropenia and possible need for additional procedures.  All of the patient's questions were answered, patient is agreeable to proceed. Consent signed and in chart.  Thank you for this interesting consult.  I greatly enjoyed meeting [name] and look forward to participating in their care.  A copy of this report was sent to the requesting provider on this date.  Electronically Signed: Murrell Redden, PA-C   08/26/2018, 9:17 AM      I spent a total of  25 Minutes in face to face in clinical consultation, greater than 50% of which was counseling/coordinating care for Pre- Y90.

## 2018-08-26 NOTE — Discharge Instructions (Signed)
Moderate Conscious Sedation, Adult, Care After These instructions provide you with information about caring for yourself after your procedure. Your health care provider may also give you more specific instructions. Your treatment has been planned according to current medical practices, but problems sometimes occur. Call your health care provider if you have any problems or questions after your procedure. What can I expect after the procedure? After your procedure, it is common:  To feel sleepy for several hours.  To feel clumsy and have poor balance for several hours.  To have poor judgment for several hours.  To vomit if you eat too soon. Follow these instructions at home: For at least 24 hours after the procedure:   Do not: ? Participate in activities where you could fall or become injured. ? Drive. ? Use heavy machinery. ? Drink alcohol. ? Take sleeping pills or medicines that cause drowsiness. ? Make important decisions or sign legal documents. ? Take care of children on your own.  Rest. Eating and drinking  Follow the diet recommended by your health care provider.  If you vomit: ? Drink water, juice, or soup when you can drink without vomiting. ? Make sure you have little or no nausea before eating solid foods. General instructions  Have a responsible adult stay with you until you are awake and alert.  Take over-the-counter and prescription medicines only as told by your health care provider.  If you smoke, do not smoke without supervision.  Keep all follow-up visits as told by your health care provider. This is important. Contact a health care provider if:  You keep feeling nauseous or you keep vomiting.  You feel light-headed.  You develop a rash.  You have a fever. Get help right away if:  You have trouble breathing. This information is not intended to replace advice given to you by your health care provider. Make sure you discuss any questions you have  with your health care provider. Document Released: 05/10/2013 Document Revised: 12/23/2015 Document Reviewed: 11/09/2015 Elsevier Interactive Patient Education  2019 [employer]   Hepatic Artery Radioembolization  Hepatic artery radioembolization is a combination of radiation therapy and a procedure to block blood supply to a tumor in the liver (embolization). It delivers tiny glass or resin beads (microspheres) into the blood vessel (hepatic artery) that supplies blood to the tumor. The microspheres lodge at the tumor site and deliver a high dose of radiation to the tumor but not to healthy tissue. The goal of this procedure is to slow or stop the growth of a cancerous tumor. You may need radioembolization if you have liver cancer or another type of cancer that has spread to the liver. After this procedure, the microspheres will release radiation into the tumor for about 2 weeks. Radiation will be completely gone after 30 days. If your tumor continues to grow or it returns after treatment, the procedure can be repeated. Tell a health care provider about:  Any allergies you have.  All medicines you are taking, including vitamins, herbs, eye drops, creams, and over-the-counter medicines.  Any problems you or family members have had with anesthetic medicines.  Any blood disorders you have.  Any surgeries you have had.  Any medical conditions you have.  Whether you are pregnant or may be pregnant. What are the risks? Generally, this is a safe procedure. However, problems may occur, including:  Infection.  Bleeding.  Allergic reactions to medicines or dyes.  Blood clots.  Stomach ulcer (if some microspheres travel to  the stomach). There is also a risk of damage to other structures or organs, such as:  The affected blood vessel.  Healthy cells that are close to the radioembolization site.  The liver. What happens before the procedure? Medicines Ask your health care provider  about:  Changing or stopping your regular medicines. This is especially important if you are taking diabetes medicines or blood thinners.  Taking medicines such as aspirin and ibuprofen. These medicines can thin your blood. Do not take these medicines before your procedure if your health care provider instructs you not to. Staying hydrated Follow instructions from your health care provider about hydration, which may include:  Up to 2 hours before the procedure - you may continue to drink clear liquids, such as water, clear fruit juice, black coffee, and plain tea. Eating and drinking restrictions Follow instructions from your health care provider about eating and drinking, which may include:  8 hours before the procedure - stop eating heavy meals or foods such as meat, fried foods, or fatty foods.  6 hours before the procedure - stop eating light meals or foods, such as toast or cereal.  6 hours before the procedure - stop drinking milk or drinks that contain milk.  2 hours before the procedure - stop drinking clear liquids. General instructions  You may have blood tests.  You may have a urine sample taken.  You will have an X-ray of the blood vessels in your liver (angiogram).  Ask your health care provider how your surgical site will be marked or identified.  To reduce your risk of infection: ? You may be given antibiotic medicine to help prevent infection. ? You may be asked to shower with a germ-killing soap.  Plan to have someone take you home from the hospital or clinic.  If you will be going home right after the procedure, plan to have someone with you for 24 hours. What happens during the procedure?  To reduce your risk of infection: ? Your health care team will wash or sanitize their hands. ? Your skin will be washed with soap.  An IV tube will be inserted into one of your veins.  You will be given one or more of the following: ? A medicine to help you relax  (sedative). ? A medicine to numb your groin area (local anesthetic). ? A medicine to make you fall asleep (general anesthetic).  A needle will be inserted into a large blood vessel in your groin (femoral artery).  A thin tube (catheter) will be passed through the needle and into your femoral artery. The catheter will be guided to the hepatic artery.  Dye will be injected through the IV tube, and X-rays will be taken (fluoroscopy). This helps to show the exact location of the blood vessels that supply your tumor. Your surgeon will use the X-rays as a guide during the procedure.  Microspheres will be injected through the catheter into the parts of the hepatic artery that supply the tumor.  More X-rays will be taken to make sure the blood supply to the tumor has been blocked as planned.  The catheter will be removed, and pressure will be applied to the area to stop any bleeding.  A bandage (dressing) will be applied. The procedure may vary among health care providers and hospitals. What happens after the procedure?   Your blood pressure, heart rate, breathing rate, and blood oxygen level will be monitored until the medicines you were given have worn off.  You may have to wear compression stockings. These stockings help to prevent blood clots and reduce swelling in your legs.  You may continue to receive fluids and medicines through an IV tube.  You may have pain, nausea, vomiting, or a fever. These symptoms are called post-embolization syndrome. You may be given medicine to help relieve these symptoms.  You will need to stay lying down for 6-8 hours.  You may need to avoid contact with people during your hospital stay. You may also need to avoid contact for a certain amount of time after you leave the hospital. Ask your health care provider if this applies to you.  Do not drive for 24 hours if you were given a sedative. This information is not intended to replace advice given to you by  your health care provider. Make sure you discuss any questions you have with your health care provider. Document Released: 07/25/2013 Document Revised: 04/18/2016 Document Reviewed: 04/18/2016 Elsevier Interactive Patient Education  2019 [employer]    Hepatic Artery Radioembolization, Care After This sheet gives you information about how to care for yourself after your procedure. Your health care provider may also give you more specific instructions. If you have problems or questions, contact your health care provider. What can I expect after the procedure? After the procedure, it is common to have:  A slight fever for 1-2 weeks. If your fever gets worse, tell your health care provider.  Fatigue.  Loss of appetite. This should gradually improve after about 1 week.  Abdominal pain on your right side.  Soreness and tenderness in your groin area where the needle and catheter were placed (puncture site). Follow these instructions at home:  Puncture site care  Follow instructions from your health care provider about how to take care of the puncture site. Make sure you: ? Wash your hands with soap and water before you change your bandage (dressing). If soap and water are not available, use hand sanitizer. ? Change your dressing as told by your health care provider. ? Leave stitches (sutures), skin glue, or adhesive strips in place. These skin closures may need to stay in place for 2 weeks or longer. If adhesive strip edges start to loosen and curl up, you may trim the loose edges. Do not remove adhesive strips completely unless your health care provider tells you to do that.  Check your puncture site every day for signs of infection. Check for: ? More redness, swelling, or pain. ? More fluid or blood. ? Warmth. ? Pus or a bad smell. Activity  Rest and return to your normal activities as told by your health care provider. Ask your health care provider what activities are safe for  you.  Do not drive for 24 hours after the procedure if you were given a medicine to help you relax (sedative).  Do not lift anything that is heavier than 10 lb (4.5 kg) until your health care provider says that it is safe. Medicines  Take over-the-counter and prescription medicines only as told by your health care provider.  Do not drive or use heavy machinery while taking prescription pain medicine. Radiation precautions  For up to a week after your procedure, there will be a small amount of radioactivity near your liver. This is not especially dangerous to other people. However, you should follow these precautions for 7 days: ? Do not come in close contact with people. ? Do not sleep in the same bed as someone else. ? Do not  hold children or babies. ? Do not have contact with pregnant women. General instructions  To prevent or treat constipation while you are taking prescription pain medicine, your health care provider may recommend that you: ? Drink enough fluid to keep your urine clear or pale yellow. ? Take over-the-counter or prescription medicines. ? Eat foods that are high in fiber, such as fresh fruits and vegetables, whole grains, and beans. ? Limit foods that are high in fat and processed sugars, such as fried and sweet foods.  Eat frequent small meals until your appetite returns. Follow instructions from your health care provider about eating or drinking restrictions.  Do not take baths, swim, or use a hot tub until your health care provider approves. You may take showers. Wash your puncture site with mild soap and water and pat the area dry.  Wear compression stockings as told by your health care provider. These stockings help to prevent blood clots and reduce swelling in your legs.  Keep all follow-up visits as told by your health care provider. This is important. You may need to have blood tests and imaging tests done. Contact a health care provider if:  You have more  redness, swelling, or pain around your puncture site.  You have more fluid or blood coming from your puncture site.  Your puncture site feels warm to the touch.  You have pus or a bad smell coming from your puncture site.  You have pain that: ? Gets worse. ? Does not get better with medicine. ? Feels like very bad heartburn. ? Is in the middle of your abdomen, above your belly button.  Your skin and the white parts of your eyes turn yellow (jaundice).  The color of your urine changes to dark brown.  The color of your stool changes to light yellow.  Your abdominal measurement (girth) increases in a short period of time.  You gain more than 5 lb (2.3 kg) in a short period of time. Get help right away if:  You have a fever that lasts longer than 2 weeks or is higher than what your health care provider told you to expect.  You develop any of the following in your legs: ? Pain. ? Swelling. ? Skin that is cold or pale or turns blue.  You have chest pain.  You have blood in your vomit, saliva, or stool.  You have trouble breathing. This information is not intended to replace advice given to you by your health care provider. Make sure you discuss any questions you have with your health care provider. Document Released: 07/25/2013 Document Revised: 04/18/2016 Document Reviewed: 04/18/2016 Elsevier Interactive Patient Education  2019 [place] Radioembolization Discharge Instructions  You have been given a radioactive material during your procedure.  While it is safe for you to be discharged home from the hospital, you need to proceed directly home.    Do not use public transportation, including air travel, lasting more than 2 hours for 1 week.  Avoid crowded public places for 1 week.  Adult visitors should try to avoid close contact with you for 1 week.    Children and pregnant females should not visit or have close contact with you for 1 week.  Items  that you touch are not radioactive.  Do not sleep in the same bed as your partner for 1 week, and a condom should be used for sexual activity during the first 24 hours.  Your blood may be radioactive  and caution should be used if any bleeding occurs during the recovery period.  Body fluids may be radioactive for 24 hours.  Wash your hands after voiding.  Men should sit to urinate.  Dispose of any soiled materials (flush down toilet or place in trash at home) during the first day.  Drink 6 to 8 glasses of fluids per day for 5 days to hydrate yourself.  If you need to see a doctor during the first week, you must let them know that you were treated with [id number] microspheres, and will be slightly radioactive.  They can call Interventional Radiology [phone] with any questions.

## 2018-08-27 ENCOUNTER — Encounter (HOSPITAL_COMMUNITY): Payer: Self-pay | Admitting: Interventional Radiology

## 2018-08-31 ENCOUNTER — Encounter (HOSPITAL_COMMUNITY): Payer: Self-pay | Admitting: Interventional Radiology

## 2018-09-08 ENCOUNTER — Ambulatory Visit: Payer: Medicare Other | Admitting: Podiatry

## 2018-09-12 ENCOUNTER — Other Ambulatory Visit: Payer: Self-pay | Admitting: Radiology

## 2018-09-13 ENCOUNTER — Encounter (HOSPITAL_COMMUNITY)
Admission: RE | Admit: 2018-09-13 | Discharge: 2018-09-13 | Disposition: A | Discharge status: Home / Self Care | Payer: Medicare Other | Source: Ambulatory Visit | Attending: Interventional Radiology | Admitting: Interventional Radiology

## 2018-09-13 ENCOUNTER — Other Ambulatory Visit (HOSPITAL_COMMUNITY): Payer: Self-pay | Admitting: Interventional Radiology

## 2018-09-13 ENCOUNTER — Telehealth: Payer: Self-pay

## 2018-09-13 ENCOUNTER — Ambulatory Visit (HOSPITAL_COMMUNITY)
Admission: RE | Admit: 2018-09-13 | Discharge: 2018-09-13 | Disposition: A | Discharge status: Home / Self Care | Payer: Medicare Other | Source: Ambulatory Visit | Attending: Interventional Radiology | Admitting: Interventional Radiology

## 2018-09-13 ENCOUNTER — Other Ambulatory Visit: Payer: Self-pay

## 2018-09-13 ENCOUNTER — Encounter (HOSPITAL_COMMUNITY): Payer: Self-pay

## 2018-09-13 DIAGNOSIS — Z91041 Radiographic dye allergy status: Secondary | ICD-10-CM | POA: Insufficient documentation

## 2018-09-13 DIAGNOSIS — Z8619 Personal history of other infectious and parasitic diseases: Secondary | ICD-10-CM | POA: Diagnosis not present

## 2018-09-13 DIAGNOSIS — C220 Liver cell carcinoma: Secondary | ICD-10-CM

## 2018-09-13 DIAGNOSIS — I10 Essential (primary) hypertension: Secondary | ICD-10-CM | POA: Insufficient documentation

## 2018-09-13 DIAGNOSIS — E7800 Pure hypercholesterolemia, unspecified: Secondary | ICD-10-CM | POA: Diagnosis not present

## 2018-09-13 DIAGNOSIS — Z79899 Other long term (current) drug therapy: Secondary | ICD-10-CM | POA: Insufficient documentation

## 2018-09-13 DIAGNOSIS — Z794 Long term (current) use of insulin: Secondary | ICD-10-CM | POA: Diagnosis not present

## 2018-09-13 DIAGNOSIS — E119 Type 2 diabetes mellitus without complications: Secondary | ICD-10-CM | POA: Insufficient documentation

## 2018-09-13 HISTORY — PX: IR US GUIDE VASC ACCESS RIGHT: IMG2390

## 2018-09-13 HISTORY — PX: IR ANGIOGRAM VISCERAL SELECTIVE: IMG657

## 2018-09-13 HISTORY — PX: IR ANGIOGRAM SELECTIVE EACH ADDITIONAL VESSEL: IMG667

## 2018-09-13 HISTORY — PX: IR EMBO TUMOR ORGAN ISCHEMIA INFARCT INC GUIDE ROADMAPPING: IMG5449

## 2018-09-13 LAB — CBC WITH DIFFERENTIAL/PLATELET
Abs Immature Granulocytes: 0.02 10*3/uL (ref 0.00–0.07)
Basophils Absolute: 0 10*3/uL (ref 0.0–0.1)
Basophils Relative: 1 %
Eosinophils Absolute: 0.1 10*3/uL (ref 0.0–0.5)
Eosinophils Relative: 3 %
HCT: 43.7 % (ref 39.0–52.0)
Hemoglobin: 13.9 g/dL (ref 13.0–17.0)
Immature Granulocytes: 0 %
LYMPHS ABS: 2.3 10*3/uL (ref 0.7–4.0)
LYMPHS PCT: 42 %
MCH: 29 pg (ref 26.0–34.0)
MCHC: 31.8 g/dL (ref 30.0–36.0)
MCV: 91 fL (ref 80.0–100.0)
Monocytes Absolute: 0.7 10*3/uL (ref 0.1–1.0)
Monocytes Relative: 13 %
Neutro Abs: 2.2 10*3/uL (ref 1.7–7.7)
Neutrophils Relative %: 41 %
Platelets: 315 10*3/uL (ref 150–400)
RBC: 4.8 MIL/uL (ref 4.22–5.81)
RDW: 13.3 % (ref 11.5–15.5)
WBC: 5.4 10*3/uL (ref 4.0–10.5)
nRBC: 0 % (ref 0.0–0.2)

## 2018-09-13 LAB — COMPREHENSIVE METABOLIC PANEL
ALBUMIN: 4.1 g/dL (ref 3.5–5.0)
ALT: 78 U/L — ABNORMAL HIGH (ref 0–44)
AST: 71 U/L — ABNORMAL HIGH (ref 15–41)
Alkaline Phosphatase: 51 U/L (ref 38–126)
Anion gap: 9 (ref 5–15)
BILIRUBIN TOTAL: 0.5 mg/dL (ref 0.3–1.2)
BUN: 18 mg/dL (ref 6–20)
CALCIUM: 9.5 mg/dL (ref 8.9–10.3)
CO2: 25 mmol/L (ref 22–32)
Chloride: 108 mmol/L (ref 98–111)
Creatinine, Ser: 1.16 mg/dL (ref 0.61–1.24)
GFR calc Af Amer: >60 mL/min (ref >60)
GFR calc non Af Amer: >60 mL/min (ref >60)
Glucose, Bld: 112 mg/dL — ABNORMAL HIGH (ref 70–99)
Potassium: 4.1 mmol/L (ref 3.5–5.1)
Sodium: 142 mmol/L (ref 135–145)
Total Protein: 7.6 g/dL (ref 6.5–8.1)

## 2018-09-13 LAB — PROTIME-INR
INR: 0.91
Prothrombin Time: 12.1 seconds (ref 11.4–15.2)

## 2018-09-13 LAB — GLUCOSE, CAPILLARY: Glucose-Capillary: 99 mg/dL (ref 70–99)

## 2018-09-13 MED ORDER — LIDOCAINE HCL (PF) 1 % IJ SOLN
INTRAMUSCULAR | Status: AC | PRN
  Administered 2018-09-13: 5 mL

## 2018-09-13 MED ORDER — PIPERACILLIN-TAZOBACTAM 3.375 G IVPB
3.3750 g | Freq: Once | INTRAVENOUS | Status: AC
  Administered 2018-09-13: 3.375 g via INTRAVENOUS

## 2018-09-13 MED ORDER — IOHEXOL 300 MG/ML  SOLN
100.0000 mL | Freq: Once | INTRAMUSCULAR | Status: AC | PRN
  Administered 2018-09-13: 55 mL via INTRA_ARTERIAL

## 2018-09-13 MED ORDER — FENTANYL CITRATE (PF) 100 MCG/2ML IJ SOLN
INTRAMUSCULAR | Status: AC | PRN
  Administered 2018-09-13 (×5): 50 ug via INTRAVENOUS

## 2018-09-13 MED ORDER — SODIUM CHLORIDE 0.9 % IV SOLN
8.0000 mg | Freq: Once | INTRAVENOUS | Status: AC
  Administered 2018-09-13: 8 mg via INTRAVENOUS
  Filled 2018-09-13: qty 4

## 2018-09-13 MED ORDER — FENTANYL CITRATE (PF) 100 MCG/2ML IJ SOLN
INTRAMUSCULAR | Status: AC
  Filled 2018-09-13: qty 4

## 2018-09-13 MED ORDER — LIDOCAINE HCL 1 % IJ SOLN
INTRAMUSCULAR | Status: AC
  Filled 2018-09-13: qty 20

## 2018-09-13 MED ORDER — PIPERACILLIN-TAZOBACTAM 3.375 G IVPB
INTRAVENOUS | Status: AC
  Administered 2018-09-13: 3.375 g via INTRAVENOUS
  Filled 2018-09-13: qty 50

## 2018-09-13 MED ORDER — ONDANSETRON HCL 4 MG PO TABS: ORAL_TABLET | ORAL | 0 refills | Status: DC

## 2018-09-13 MED ORDER — SODIUM CHLORIDE 0.9 % IV SOLN
INTRAVENOUS | Status: DC
  Administered 2018-09-13 (×2): via INTRAVENOUS

## 2018-09-13 MED ORDER — MIDAZOLAM HCL 2 MG/2ML IJ SOLN
INTRAMUSCULAR | Status: AC
  Filled 2018-09-13: qty 6

## 2018-09-13 MED ORDER — YTTRIUM 90 INJECTION: 29.4200 | INJECTION | Freq: Once | INTRAVENOUS | Status: DC

## 2018-09-13 MED ORDER — FENTANYL CITRATE (PF) 100 MCG/2ML IJ SOLN
25.0000 ug | Freq: Once | INTRAMUSCULAR | Status: AC
  Administered 2018-09-13: 25 ug via INTRAVENOUS
  Filled 2018-09-13: qty 2

## 2018-09-13 MED ORDER — MIDAZOLAM HCL 2 MG/2ML IJ SOLN
INTRAMUSCULAR | Status: AC | PRN
  Administered 2018-09-13 (×4): 1 mg via INTRAVENOUS

## 2018-09-13 MED ORDER — FAMOTIDINE IN NACL 20-0.9 MG/50ML-% IV SOLN
20.0000 mg | Freq: Once | INTRAVENOUS | Status: AC
  Administered 2018-09-13: 20 mg via INTRAVENOUS
  Filled 2018-09-13: qty 50

## 2018-09-13 MED ORDER — DEXAMETHASONE SODIUM PHOSPHATE 10 MG/ML IJ SOLN
8.0000 mg | Freq: Once | INTRAMUSCULAR | Status: AC
  Administered 2018-09-13: 10 mg via INTRAVENOUS
  Filled 2018-09-13: qty 1

## 2018-09-13 NOTE — H&P (Signed)
Referring Physician(s): Feng,Y  Supervising Physician: Aletta Edouard  Patient Status:  [facility]  Chief Complaint: Multifocal hepatocellular carcinoma   Subjective: Patient familiar to IR service from prior right hepatic  lobe mass biopsy on 12/23/2017, segment 4A left lobe hepatocellular carcinoma chemoembolization on 03/18/2018 and pre Y 90 hepatic/visceral arteriogram with embolization on 08/26/2018.  He has a history of hepatitis C and multifocal hepatocellular carcinoma and presents again today for staged hepatic Y 90 radioembolization, initially of the right lobe.  He denies fever, headache, chest pain, dyspnea, cough, abdominal/back pain, nausea, vomiting or bleeding.  Past Medical History:  Diagnosis Date  . Chest pain    pt states when he turned over on other side aided in relief  . Diabetes mellitus   . Dyspnea    increased activity  . Elevated cholesterol   . Hepatitis C   . Hypertension    Past Surgical History:  Procedure Laterality Date  . I&D EXTREMITY  06/28/2011   Procedure: IRRIGATION AND DEBRIDEMENT EXTREMITY;  Surgeon: Linna Hoff;  Location: [facility];  Service: Orthopedics;  Laterality: Left;  with application of wound vac  . INCISION AND DRAINAGE OF WOUND  07/02/2011   Procedure: IRRIGATION AND DEBRIDEMENT WOUND;  Surgeon: Linna Hoff;  Location: [facility];  Service: Orthopedics;  Laterality: Left;  Irrigation and Debridement of left arm wound with  Skin Grafting from left thigh   . IR ANGIOGRAM SELECTIVE EACH ADDITIONAL VESSEL  03/18/2018  . IR ANGIOGRAM SELECTIVE EACH ADDITIONAL VESSEL  08/26/2018  . IR ANGIOGRAM SELECTIVE EACH ADDITIONAL VESSEL  08/26/2018  . IR ANGIOGRAM SELECTIVE EACH ADDITIONAL VESSEL  08/26/2018  . IR ANGIOGRAM SELECTIVE EACH ADDITIONAL VESSEL  08/26/2018  . IR ANGIOGRAM VISCERAL SELECTIVE  03/18/2018  . IR ANGIOGRAM VISCERAL SELECTIVE  03/18/2018  . IR ANGIOGRAM VISCERAL SELECTIVE  08/26/2018  . IR ANGIOGRAM VISCERAL SELECTIVE  08/26/2018    . IR EMBO ARTERIAL NOT HEMORR HEMANG INC GUIDE ROADMAPPING  08/26/2018  . IR EMBO ARTERIAL NOT HEMORR HEMANG INC GUIDE ROADMAPPING  08/26/2018  . IR EMBO TUMOR ORGAN ISCHEMIA INFARCT INC GUIDE ROADMAPPING  03/18/2018  . IR RADIOLOGIST EVAL & MGMT  02/16/2018  . IR RADIOLOGIST EVAL & MGMT  07/12/2018  . IR US GUIDE VASC ACCESS RIGHT  03/18/2018  . IR US GUIDE VASC ACCESS RIGHT  08/26/2018  . JOINT REPLACEMENT    . PARTIAL HIP ARTHROPLASTY        Allergies: Gadolinium derivatives and Multihance [gadobenate]  Medications: Prior to Admission medications   Medication Sig Start Date End Date Taking? Authorizing Provider  cyclobenzaprine (FLEXERIL) 10 MG tablet Take 1/2 to 1 whole tablet by mouth every 8 hours as needed for muscle spasms. 07/22/18  Yes Amyot, Nicholes Stairs, NP  gabapentin (NEURONTIN) 300 MG capsule Take 1 capsule (300 mg total) by mouth 3 (three) times daily. 07/18/18  Yes Lanae Boast, FNP  glipiZIDE (GLUCOTROL) 10 MG tablet TAKE 1 TABLET(10 MG) BY MOUTH TWICE DAILY BEFORE A MEAL 07/18/18  Yes Lanae Boast, FNP  glucose blood test strip Check blood sugar 3 times per day prior to meals. Use as instructed 12/22/17  Yes Dorena Dew, FNP  hydrochlorothiazide (HYDRODIURIL) 25 MG tablet Take 1 tablet (25 mg total) by mouth daily. 07/18/18  Yes Lanae Boast, FNP  insulin glargine (LANTUS) 100 UNIT/ML injection Inject 0.3 mLs (30 Units total) into the skin at bedtime. 07/18/18  Yes Lanae Boast, FNP  lisinopril (PRINIVIL,ZESTRIL) 20 MG tablet Take 1  tablet (20 mg total) by mouth daily. 07/18/18  Yes Lanae Boast, FNP  metFORMIN (GLUCOPHAGE) 1000 MG tablet TAKE 1 TABLET(1000 MG) BY MOUTH TWICE DAILY 07/18/18  Yes Lanae Boast, FNP  ondansetron (ZOFRAN) 4 MG tablet TAKE 1 TABLET(4 MG) BY MOUTH EVERY 8 HOURS AS NEEDED FOR NAUSEA OR VOMITING 08/18/18  Yes Lanae Boast, FNP  pravastatin (PRAVACHOL) 80 MG tablet Take 1 tablet (80 mg total) by mouth daily. 07/18/18  Yes Lanae Boast, FNP  traMADol (ULTRAM) 50 MG tablet Take 1 tablet (50 mg total) by mouth every 8 (eight) hours as needed. 07/18/18  Yes Lanae Boast, FNP  diclofenac (VOLTAREN) 75 MG EC tablet Take 1 tablet (75 mg total) by mouth 2 (two) times daily as needed for moderate pain. 07/22/18   Katy Apo, NP  senna (SENOKOT) 8.6 MG tablet Take 1 tablet by mouth as needed for constipation.    [provider]  Sofosbuvir-Velpatasvir (EPCLUSA) 400-100 MG TABS Take 1 tablet by mouth daily. 11/16/17   Thayer Headings, MD     Vital Signs: BP (!) 143/98 (BP Location: Right Arm)   Pulse 94   Temp 98.7 F (37.1 C) (Oral)   Resp 16   SpO2 98%   Physical Exam awake, alert.  Chest clear to auscultation bilaterally.  Heart with regular rate and rhythm.  Abdomen soft, positive bowel sounds, nontender.  No lower extremity edema.  Imaging: No results found.  Labs:  CBC: Recent Labs    03/18/18 0800 03/19/18 0433 07/12/18 1454 08/26/18 0825  WBC 6.4 12.4* 5.1 5.3  HGB 15.3 13.6 14.5 14.7  HCT 45.4 40.3 44.7 46.0  PLT 334 304 292 253    COAGS: Recent Labs    09/23/17 1122 12/23/17 0645 03/18/18 0800 08/26/18 0825  INR 1.0 1.01 0.93 0.94  APTT  --  33 32  --     BMP: Recent Labs    03/18/18 0800 03/19/18 0433 06/14/18 0839 07/12/18 1454 08/26/18 0825  NA 143 136  --  142 135  K 4.2 4.1  --  4.6 3.3*  CL 105 105  --  107 97*  CO2 27 23  --  28 27  GLUCOSE 143* 290*  --  85 111*  BUN 17 30*  --  19 8  CALCIUM 9.8 8.8*  --  9.9 9.5  CREATININE 1.26* 1.16 1.30* 1.22 0.99  GFRNONAA >60 >60  --  >60 >60  GFRAA >60 >60  --  >60 >60    LIVER FUNCTION TESTS: Recent Labs    03/18/18 0800 03/19/18 0433 07/12/18 1454 08/26/18 0825  BILITOT 0.6 0.7 0.4 0.7  AST 65* 60* 46* 94*  ALT 95* 82* 70* 118*  ALKPHOS 51 51 68 54  PROT 7.7 6.5 7.4 8.0  ALBUMIN 4.1 3.5 3.7 4.3    Assessment and Plan: Patient with history of hepatitis C, multifocal hepatocellular carcinoma  with prior chemoembolization of segment 4A left lobe [facility] 03/18/18 and pre-Y 90 hepatic/visceral arteriogram with embolization on 08/26/2018.  He presents today for staged right hepatic lobe Y 90 radioembolization. Risks and benefits of procedure were discussed with the patient/sig other including, but not limited to bleeding, infection, vascular injury or contrast induced renal failure.  This interventional procedure involves the use of X-rays and because of the nature of the planned procedure, it is possible that we will have prolonged use of X-ray fluoroscopy.  Potential radiation risks to you include (but are not limited to) the following: -  A slightly elevated risk for cancer  several years later in life. This risk is typically less than 0.5% percent. This risk is low in comparison to the normal incidence of human cancer, which is 33% for women and 50% for men according to the [employer]. - Radiation induced injury can include skin redness, resembling a rash, tissue breakdown / ulcers and hair loss (which can be temporary or permanent).   The likelihood of either of these occurring depends on the difficulty of the procedure and whether you are sensitive to radiation due to previous procedures, disease, or genetic conditions.   IF your procedure requires a prolonged use of radiation, you will be notified and given written instructions for further action.  It is your responsibility to monitor the irradiated area for the 2 weeks following the procedure and to notify your physician if you are concerned that you have suffered a radiation induced injury.    All of the patient's questions were answered, patient is agreeable to proceed.  Consent signed and in chart.  LABS PENDING    Electronically Signed: D. Rowe Robert, PA-C 09/13/2018, 8:36 AM   I spent a total of 25 minutes at the the patient's bedside AND on the patient's hospital floor or unit, greater than 50% of which was  counseling/coordinating care for hepatic/visceral arteriogram with Y 90 radioembolization

## 2018-09-13 NOTE — Discharge Instructions (Signed)
Post Y-90 Radioembolization Discharge Instructions  You have been given a radioactive material during your procedure.  While it is safe for you to be discharged home from the hospital, you need to proceed directly home.    Do not use public transportation, including air travel, lasting more than 2 hours for 1 week.  Avoid crowded public places for 1 week.  Adult visitors should try to avoid close contact with you for 1 week.    Children and pregnant females should not visit or have close contact with you for 1 week.  Items that you touch are not radioactive.  Do not sleep in the same bed as your partner for 1 week, and a condom should be used for sexual activity during the first 24 hours.  Your blood may be radioactive and caution should be used if any bleeding occurs during the recovery period.  Body fluids may be radioactive for 24 hours.  Wash your hands after voiding.  Men should sit to urinate.  Dispose of any soiled materials (flush down toilet or place in trash at home) during the first day.  Drink 6 to 8 glasses of fluids per day for 5 days to hydrate yourself.  If you need to see a doctor during the first week, you must let them know that you were treated with yttrium-90 microspheres, and will be slightly radioactive.  They can call Interventional Radiology [phone] with any questions.   Moderate Conscious Sedation, Adult, Care After These instructions provide you with information about caring for yourself after your procedure. Your health care provider may also give you more specific instructions. Your treatment has been planned according to current medical practices, but problems sometimes occur. Call your health care provider if you have any problems or questions after your procedure. What can I expect after the procedure? After your procedure, it is common:  To feel sleepy for several hours.  To feel clumsy and have poor balance for several hours.  To have poor  judgment for several hours.  To vomit if you eat too soon. Follow these instructions at home: For at least 24 hours after the procedure:   Do not: ? Participate in activities where you could fall or become injured. ? Drive. ? Use heavy machinery. ? Drink alcohol. ? Take sleeping pills or medicines that cause drowsiness. ? Make important decisions or sign legal documents. ? Take care of children on your own.  Rest. Eating and drinking  Follow the diet recommended by your health care provider.  If you vomit: ? Drink water, juice, or soup when you can drink without vomiting. ? Make sure you have little or no nausea before eating solid foods. General instructions  Have a responsible adult stay with you until you are awake and alert.  Take over-the-counter and prescription medicines only as told by your health care provider.  If you smoke, do not smoke without supervision.  Keep all follow-up visits as told by your health care provider. This is important. Contact a health care provider if:  You keep feeling nauseous or you keep vomiting.  You feel light-headed.  You develop a rash.  You have a fever. Get help right away if:  You have trouble breathing. This information is not intended to replace advice given to you by your health care provider. Make sure you discuss any questions you have with your health care provider. Document Released: 05/10/2013 Document Revised: 12/23/2015 Document Reviewed: 11/09/2015 Elsevier Interactive Patient Education  2019 [employer].  Femoral Site Care This sheet gives you information about how to care for yourself after your procedure. Your health care provider may also give you more specific instructions. If you have problems or questions, contact your health care provider. What can I expect after the procedure? After the procedure, it is common to have:  Bruising that usually fades within 1-2 weeks.  Tenderness at the  site. Follow these instructions at home: Wound care  Follow instructions from your health care provider about how to take care of your insertion site. Make sure you: ? Wash your hands with soap and water before you change your bandage (dressing). If soap and water are not available, use hand sanitizer. ? Change your dressing as told by your health care provider. ? Leave stitches (sutures), skin glue, or adhesive strips in place. These skin closures may need to stay in place for 2 weeks or longer. If adhesive strip edges start to loosen and curl up, you may trim the loose edges. Do not remove adhesive strips completely unless your health care provider tells you to do that.  Do not take baths, swim, or use a hot tub until your health care provider approves.  You may shower 24-48 hours after the procedure or as told by your health care provider. ? Gently wash the site with plain soap and water. ? Pat the area dry with a clean towel. ? Do not rub the site. This may cause bleeding.  Do not apply powder or lotion to the site. Keep the site clean and dry.  Check your femoral site every day for signs of infection. Check for: ? Redness, swelling, or pain. ? Fluid or blood. ? Warmth. ? Pus or a bad smell. Activity  For the first 2-3 days after your procedure, or as long as directed: ? Avoid climbing stairs as much as possible. ? Do not squat.  Do not lift anything that is heavier than 10 lb (4.5 kg), or the limit that you are told, until your health care provider says that it is safe.  Rest as directed. ? Avoid sitting for a long time without moving. Get up to take short walks every 1-2 hours.  Do not drive for 24 hours if you were given a medicine to help you relax (sedative). General instructions  Take over-the-counter and prescription medicines only as told by your health care provider.  Keep all follow-up visits as told by your health care provider. This is important. Contact a  health care provider if you have:  A fever or chills.  You have redness, swelling, or pain around your insertion site. Get help right away if:  The catheter insertion area swells very fast.  You pass out.  You suddenly start to sweat or your skin gets clammy.  The catheter insertion area is bleeding, and the bleeding does not stop when you hold steady pressure on the area.  The area near or just beyond the catheter insertion site becomes pale, cool, tingly, or numb. These symptoms may represent a serious problem that is an emergency. Do not wait to see if the symptoms will go away. Get medical help right away. Call your local emergency services (911 in the [place] Do not drive yourself to the hospital. Summary  After the procedure, it is common to have bruising that usually fades within 1-2 weeks.  Check your femoral site every day for signs of infection.  Do not lift anything that is heavier than 10 lb (4.5 kg), or  the limit that you are told, until your health care provider says that it is safe. This information is not intended to replace advice given to you by your health care provider. Make sure you discuss any questions you have with your health care provider. Document Released: 03/23/2014 Document Revised: 08/02/2017 Document Reviewed: 08/02/2017 Elsevier Interactive Patient Education  2019 [employer].

## 2018-09-13 NOTE — Procedures (Signed)
Interventional Radiology Procedure Note  Procedure: Right hepatic arterial Y-90 radioembolization  Complications: None  Estimated Blood Loss: < 10 mL  Findings: 31 mCi Y-90 SIR Sphere dose administered via microcatheter into replaced right hepatic artery off of SMA.   Venetia Night. Kathlene Cote, M.D Pager:  [phone]

## 2018-09-13 NOTE — Sedation Documentation (Signed)
To nuc med for study

## 2018-09-27 ENCOUNTER — Telehealth: Payer: Self-pay

## 2018-09-27 ENCOUNTER — Telehealth: Payer: Self-pay | Admitting: *Deleted

## 2018-09-27 NOTE — Telephone Encounter (Signed)
"  ChrisopherMilsap's office [phone]) calling about mutual patient [name].  Performed emergency extraction today.  Need recommendations for pain and antibiotic medications since he is under treatment with your office.  If you would return call as soon as possible."

## 2018-09-27 NOTE — Telephone Encounter (Signed)
Spoke with [name] at Dr. Benito Mccreedy office, the had to do any emergency dental extraction of a front foot today, wanted to let us know prescribing him Amoxicillin post extraction, did not give him pain medication as he states he already has some.

## 2018-09-28 ENCOUNTER — Ambulatory Visit: Payer: Self-pay | Admitting: Family Medicine

## 2018-09-28 ENCOUNTER — Encounter (HOSPITAL_COMMUNITY): Payer: Self-pay

## 2018-09-28 NOTE — Addendum Note (Signed)
Encounter addended by: Riley Churches on: 09/28/2018 1:39 PM  Actions taken: Imaging Exam ended

## 2018-09-28 NOTE — Addendum Note (Signed)
Encounter addended by: Riley Churches on: 09/28/2018 1:34 PM  Actions taken: Order list changed, Imaging Exam ended

## 2018-09-28 NOTE — Telephone Encounter (Signed)
[name], please call in amoxicillin 864m bid for 5 days, thanks   YTruitt MerleMD

## 2018-09-28 NOTE — Addendum Note (Signed)
Encounter addended by: Riley Churches on: 09/28/2018 1:36 PM  Actions taken: Imaging Exam ended

## 2018-09-30 ENCOUNTER — Telehealth: Payer: Self-pay

## 2018-09-30 DIAGNOSIS — E118 Type 2 diabetes mellitus with unspecified complications: Secondary | ICD-10-CM

## 2018-09-30 DIAGNOSIS — Z794 Long term (current) use of insulin: Principal | ICD-10-CM

## 2018-09-30 MED ORDER — METFORMIN HCL 1000 MG PO TABS: ORAL_TABLET | ORAL | 0 refills | Status: DC

## 2018-09-30 NOTE — Telephone Encounter (Signed)
Refill for metformin sent to pharmacy.

## 2018-10-06 ENCOUNTER — Telehealth: Payer: Self-pay

## 2018-10-06 DIAGNOSIS — I10 Essential (primary) hypertension: Secondary | ICD-10-CM

## 2018-10-06 MED ORDER — SYRINGE (DISPOSABLE) 30 ML MISC: 1.0000 | 5 refills | Status: AC

## 2018-10-06 MED ORDER — LISINOPRIL 20 MG PO TABS: 20.0000 mg | ORAL_TABLET | Freq: Every day | ORAL | 1 refills | Status: AC

## 2018-10-06 NOTE — Telephone Encounter (Signed)
Medication sent to pharmacy  

## 2018-10-10 ENCOUNTER — Other Ambulatory Visit: Payer: Self-pay | Admitting: Family Medicine

## 2018-10-10 DIAGNOSIS — C220 Liver cell carcinoma: Secondary | ICD-10-CM

## 2018-10-17 ENCOUNTER — Ambulatory Visit: Payer: Self-pay | Admitting: Family Medicine

## 2018-10-18 ENCOUNTER — Encounter (HOSPITAL_COMMUNITY): Payer: Self-pay

## 2018-10-18 ENCOUNTER — Encounter (HOSPITAL_COMMUNITY): Payer: Medicare Other

## 2018-10-18 ENCOUNTER — Other Ambulatory Visit (HOSPITAL_COMMUNITY): Payer: Self-pay

## 2018-10-18 ENCOUNTER — Ambulatory Visit (HOSPITAL_COMMUNITY): Payer: Medicare Other

## 2018-10-19 ENCOUNTER — Telehealth: Payer: Self-pay | Admitting: Hematology

## 2018-10-19 NOTE — Telephone Encounter (Signed)
R/s appt per YF due to Covid 19 - pt wife aware of appt date and time .

## 2018-10-21 ENCOUNTER — Inpatient Hospital Stay: Payer: Medicare Other

## 2018-10-21 ENCOUNTER — Telehealth: Payer: Self-pay

## 2018-10-21 ENCOUNTER — Other Ambulatory Visit: Payer: Self-pay

## 2018-10-21 ENCOUNTER — Inpatient Hospital Stay: Payer: Medicare Other | Admitting: Hematology

## 2018-10-21 DIAGNOSIS — Z794 Long term (current) use of insulin: Secondary | ICD-10-CM

## 2018-10-21 DIAGNOSIS — G629 Polyneuropathy, unspecified: Secondary | ICD-10-CM

## 2018-10-21 DIAGNOSIS — I10 Essential (primary) hypertension: Secondary | ICD-10-CM

## 2018-10-21 DIAGNOSIS — E118 Type 2 diabetes mellitus with unspecified complications: Secondary | ICD-10-CM

## 2018-10-21 DIAGNOSIS — E785 Hyperlipidemia, unspecified: Secondary | ICD-10-CM

## 2018-10-21 MED ORDER — INSULIN GLARGINE 100 UNIT/ML ~~LOC~~ SOLN: 30.0000 [IU] | Freq: Every day | SUBCUTANEOUS | 11 refills | Status: AC

## 2018-10-21 MED ORDER — GLIPIZIDE 10 MG PO TABS: ORAL_TABLET | ORAL | 2 refills | Status: AC

## 2018-10-21 MED ORDER — GABAPENTIN 300 MG PO CAPS: 300.0000 mg | ORAL_CAPSULE | Freq: Three times a day (TID) | ORAL | 1 refills | Status: AC

## 2018-10-21 MED ORDER — METFORMIN HCL 1000 MG PO TABS: ORAL_TABLET | ORAL | 0 refills | Status: AC

## 2018-10-21 MED ORDER — HYDROCHLOROTHIAZIDE 25 MG PO TABS: 25.0000 mg | ORAL_TABLET | Freq: Every day | ORAL | 1 refills | Status: DC

## 2018-10-21 MED ORDER — PRAVASTATIN SODIUM 80 MG PO TABS: 80.0000 mg | ORAL_TABLET | Freq: Every day | ORAL | 1 refills | Status: AC

## 2018-10-21 NOTE — Telephone Encounter (Signed)
Medication sent to pharmacy  

## 2018-10-24 ENCOUNTER — Ambulatory Visit: Payer: Self-pay | Admitting: Family Medicine

## 2018-10-30 ENCOUNTER — Other Ambulatory Visit: Payer: Self-pay | Admitting: Family Medicine

## 2018-10-30 DIAGNOSIS — I10 Essential (primary) hypertension: Secondary | ICD-10-CM

## 2018-11-02 ENCOUNTER — Telehealth: Payer: Self-pay

## 2018-11-02 DIAGNOSIS — E118 Type 2 diabetes mellitus with unspecified complications: Secondary | ICD-10-CM

## 2018-11-02 DIAGNOSIS — Z794 Long term (current) use of insulin: Secondary | ICD-10-CM

## 2018-11-02 DIAGNOSIS — C220 Liver cell carcinoma: Secondary | ICD-10-CM

## 2018-11-02 MED ORDER — GLUCOSE BLOOD VI STRP: ORAL_STRIP | 12 refills | Status: AC

## 2018-11-02 MED ORDER — PEN NEEDLES 32G X 4 MM MISC: 1.0000 | Freq: Three times a day (TID) | 2 refills | Status: AC | PRN

## 2018-11-02 MED ORDER — ONDANSETRON HCL 4 MG PO TABS: ORAL_TABLET | ORAL | 0 refills | Status: DC

## 2018-11-02 NOTE — Telephone Encounter (Signed)
Medication has been sent to pharmacy.  °

## 2018-11-07 ENCOUNTER — Telehealth: Payer: Self-pay

## 2018-11-07 NOTE — Telephone Encounter (Signed)
Received voice message from patient's significant other that patient is not feeling well, tried to reach him on cell and home numbers, no answer, no voice mail boxes available.

## 2018-11-23 ENCOUNTER — Other Ambulatory Visit: Payer: Self-pay

## 2018-11-23 ENCOUNTER — Ambulatory Visit: Payer: Medicare Other | Admitting: Family Medicine

## 2018-11-28 ENCOUNTER — Telehealth: Payer: Self-pay

## 2018-11-28 DIAGNOSIS — C220 Liver cell carcinoma: Secondary | ICD-10-CM

## 2018-11-28 MED ORDER — ONDANSETRON HCL 4 MG PO TABS: ORAL_TABLET | ORAL | 0 refills | Status: DC

## 2018-11-28 NOTE — Telephone Encounter (Signed)
Refill sent into pharmacy. Thanks!

## 2018-12-01 ENCOUNTER — Other Ambulatory Visit: Payer: Self-pay | Admitting: Family Medicine

## 2018-12-01 DIAGNOSIS — C220 Liver cell carcinoma: Secondary | ICD-10-CM

## 2018-12-01 NOTE — Telephone Encounter (Signed)
Refill request for Tramadol. Please advise.

## 2018-12-08 NOTE — Telephone Encounter (Signed)
Message sent to provider 

## 2018-12-12 ENCOUNTER — Other Ambulatory Visit: Payer: Self-pay | Admitting: *Deleted

## 2018-12-12 DIAGNOSIS — C220 Liver cell carcinoma: Secondary | ICD-10-CM

## 2019-01-02 DIAGNOSIS — C220 Liver cell carcinoma: Secondary | ICD-10-CM | POA: Diagnosis not present

## 2019-01-03 LAB — COMPLETE METABOLIC PANEL WITH GFR
AG Ratio: 1.1 (calc) (ref 1.0–2.5)
ALT: 89 U/L — ABNORMAL HIGH (ref 9–46)
AST: 105 U/L — ABNORMAL HIGH (ref 10–35)
Albumin: 3.9 g/dL (ref 3.6–5.1)
Alkaline phosphatase (APISO): 76 U/L (ref 35–144)
BUN: 19 mg/dL (ref 7–25)
CO2: 28 mmol/L (ref 20–32)
Calcium: 10.1 mg/dL (ref 8.6–10.3)
Chloride: 99 mmol/L (ref 98–110)
Creat: 1.2 mg/dL (ref 0.70–1.33)
GFR, Est African American: 80 mL/min/{1.73_m2} (ref >=60)
GFR, Est Non African American: 69 mL/min/{1.73_m2} (ref >=60)
Globulin: 3.5 g/dL (calc) (ref 1.9–3.7)
Glucose, Bld: 158 mg/dL — ABNORMAL HIGH (ref 65–99)
Potassium: 4.5 mmol/L (ref 3.5–5.3)
Sodium: 136 mmol/L (ref 135–146)
Total Bilirubin: 0.8 mg/dL (ref 0.2–1.2)
Total Protein: 7.4 g/dL (ref 6.1–8.1)

## 2019-01-03 LAB — CBC
HCT: 45.6 % (ref 38.5–50.0)
Hemoglobin: 15.3 g/dL (ref 13.2–17.1)
MCH: 29.1 pg (ref 27.0–33.0)
MCHC: 33.6 g/dL (ref 32.0–36.0)
MCV: 86.9 fL (ref 80.0–100.0)
MPV: 9.7 fL (ref 7.5–12.5)
Platelets: 262 10*3/uL (ref 140–400)
RBC: 5.25 10*6/uL (ref 4.20–5.80)
RDW: 12.9 % (ref 11.0–15.0)
WBC: 3.2 10*3/uL — ABNORMAL LOW (ref 3.8–10.8)

## 2019-01-03 LAB — PROTIME-INR
INR: 1
Prothrombin Time: 10.5 s (ref 9.0–11.5)

## 2019-01-03 LAB — AFP TUMOR MARKER: AFP-Tumor Marker: 27.1 ng/mL — ABNORMAL HIGH (ref <6.1)

## 2019-01-06 ENCOUNTER — Other Ambulatory Visit: Payer: Self-pay | Admitting: Radiology

## 2019-01-09 ENCOUNTER — Other Ambulatory Visit (HOSPITAL_COMMUNITY): Payer: Self-pay | Admitting: Interventional Radiology

## 2019-01-09 ENCOUNTER — Encounter (HOSPITAL_COMMUNITY): Payer: Self-pay

## 2019-01-09 ENCOUNTER — Encounter (HOSPITAL_COMMUNITY)
Admission: RE | Admit: 2019-01-09 | Discharge: 2019-01-09 | Disposition: A | Discharge status: Home / Self Care | Payer: Medicare Other | Source: Ambulatory Visit | Attending: Interventional Radiology | Admitting: Interventional Radiology

## 2019-01-09 ENCOUNTER — Other Ambulatory Visit: Payer: Self-pay

## 2019-01-09 ENCOUNTER — Ambulatory Visit (HOSPITAL_COMMUNITY)
Admission: RE | Admit: 2019-01-09 | Discharge: 2019-01-09 | Disposition: A | Discharge status: Home / Self Care | Payer: Medicare Other | Source: Ambulatory Visit | Attending: Interventional Radiology | Admitting: Interventional Radiology

## 2019-01-09 DIAGNOSIS — Z91041 Radiographic dye allergy status: Secondary | ICD-10-CM | POA: Insufficient documentation

## 2019-01-09 DIAGNOSIS — I10 Essential (primary) hypertension: Secondary | ICD-10-CM | POA: Insufficient documentation

## 2019-01-09 DIAGNOSIS — E7800 Pure hypercholesterolemia, unspecified: Secondary | ICD-10-CM | POA: Insufficient documentation

## 2019-01-09 DIAGNOSIS — Z79899 Other long term (current) drug therapy: Secondary | ICD-10-CM | POA: Diagnosis not present

## 2019-01-09 DIAGNOSIS — Z794 Long term (current) use of insulin: Secondary | ICD-10-CM | POA: Insufficient documentation

## 2019-01-09 DIAGNOSIS — B1920 Unspecified viral hepatitis C without hepatic coma: Secondary | ICD-10-CM | POA: Insufficient documentation

## 2019-01-09 DIAGNOSIS — R0600 Dyspnea, unspecified: Secondary | ICD-10-CM | POA: Diagnosis not present

## 2019-01-09 DIAGNOSIS — Z96649 Presence of unspecified artificial hip joint: Secondary | ICD-10-CM | POA: Insufficient documentation

## 2019-01-09 DIAGNOSIS — C220 Liver cell carcinoma: Secondary | ICD-10-CM | POA: Insufficient documentation

## 2019-01-09 DIAGNOSIS — E119 Type 2 diabetes mellitus without complications: Secondary | ICD-10-CM | POA: Insufficient documentation

## 2019-01-09 HISTORY — PX: IR ANGIOGRAM SELECTIVE EACH ADDITIONAL VESSEL: IMG667

## 2019-01-09 HISTORY — PX: IR ANGIOGRAM VISCERAL SELECTIVE: IMG657

## 2019-01-09 HISTORY — PX: IR EMBO TUMOR ORGAN ISCHEMIA INFARCT INC GUIDE ROADMAPPING: IMG5449

## 2019-01-09 HISTORY — PX: IR US GUIDE VASC ACCESS RIGHT: IMG2390

## 2019-01-09 LAB — COMPREHENSIVE METABOLIC PANEL
ALT: 96 U/L — ABNORMAL HIGH (ref 0–44)
AST: 85 U/L — ABNORMAL HIGH (ref 15–41)
Albumin: 3.6 g/dL (ref 3.5–5.0)
Alkaline Phosphatase: 68 U/L (ref 38–126)
Anion gap: 10 (ref 5–15)
BUN: 15 mg/dL (ref 6–20)
CO2: 24 mmol/L (ref 22–32)
Calcium: 9.2 mg/dL (ref 8.9–10.3)
Chloride: 103 mmol/L (ref 98–111)
Creatinine, Ser: 0.94 mg/dL (ref 0.61–1.24)
GFR calc Af Amer: >60 mL/min (ref >60)
GFR calc non Af Amer: >60 mL/min (ref >60)
Glucose, Bld: 196 mg/dL — ABNORMAL HIGH (ref 70–99)
Potassium: 3.9 mmol/L (ref 3.5–5.1)
Sodium: 137 mmol/L (ref 135–145)
Total Bilirubin: 0.5 mg/dL (ref 0.3–1.2)
Total Protein: 7.2 g/dL (ref 6.5–8.1)

## 2019-01-09 LAB — CBC WITH DIFFERENTIAL/PLATELET
Abs Immature Granulocytes: 0.02 10*3/uL (ref 0.00–0.07)
Basophils Absolute: 0 10*3/uL (ref 0.0–0.1)
Basophils Relative: 1 %
Eosinophils Absolute: 0 10*3/uL (ref 0.0–0.5)
Eosinophils Relative: 1 %
HCT: 43.4 % (ref 39.0–52.0)
Hemoglobin: 14.3 g/dL (ref 13.0–17.0)
Immature Granulocytes: 1 %
Lymphocytes Relative: 14 %
Lymphs Abs: 0.4 10*3/uL — ABNORMAL LOW (ref 0.7–4.0)
MCH: 29.7 pg (ref 26.0–34.0)
MCHC: 32.9 g/dL (ref 30.0–36.0)
MCV: 90.2 fL (ref 80.0–100.0)
Monocytes Absolute: 0.6 10*3/uL (ref 0.1–1.0)
Monocytes Relative: 18 %
Neutro Abs: 2.1 10*3/uL (ref 1.7–7.7)
Neutrophils Relative %: 65 %
Platelets: 228 10*3/uL (ref 150–400)
RBC: 4.81 MIL/uL (ref 4.22–5.81)
RDW: 12.4 % (ref 11.5–15.5)
WBC: 3.2 10*3/uL — ABNORMAL LOW (ref 4.0–10.5)
nRBC: 0 % (ref 0.0–0.2)

## 2019-01-09 LAB — GLUCOSE, CAPILLARY: Glucose-Capillary: 258 mg/dL — ABNORMAL HIGH (ref 70–99)

## 2019-01-09 LAB — PROTIME-INR
INR: 0.9 (ref 0.8–1.2)
Prothrombin Time: 12.2 seconds (ref 11.4–15.2)

## 2019-01-09 MED ORDER — IOHEXOL 300 MG/ML  SOLN
100.0000 mL | Freq: Once | INTRAMUSCULAR | Status: AC | PRN
  Administered 2019-01-09: 60 mL via INTRA_ARTERIAL

## 2019-01-09 MED ORDER — IOHEXOL 300 MG/ML  SOLN
100.0000 mL | Freq: Once | INTRAMUSCULAR | Status: AC | PRN
  Administered 2019-01-09: 15 mL via INTRA_ARTERIAL

## 2019-01-09 MED ORDER — MIDAZOLAM HCL 2 MG/2ML IJ SOLN
INTRAMUSCULAR | Status: AC
  Filled 2019-01-09: qty 4

## 2019-01-09 MED ORDER — YTTRIUM 90 INJECTION: 22.7800 | INJECTION | Freq: Once | INTRAVENOUS | Status: DC | PRN

## 2019-01-09 MED ORDER — HYDROCODONE-ACETAMINOPHEN 5-325 MG PO TABS: 1.0000 | ORAL_TABLET | ORAL | Status: DC | PRN

## 2019-01-09 MED ORDER — SODIUM CHLORIDE 0.9 % IV SOLN
INTRAVENOUS | Status: DC
  Administered 2019-01-09: 08:00:00 via INTRAVENOUS

## 2019-01-09 MED ORDER — PIPERACILLIN-TAZOBACTAM 3.375 G IVPB
3.3750 g | Freq: Once | INTRAVENOUS | Status: AC
  Administered 2019-01-09: 3.375 g via INTRAVENOUS
  Filled 2019-01-09: qty 50

## 2019-01-09 MED ORDER — SODIUM CHLORIDE 0.9 % IV SOLN
8.0000 mg | Freq: Once | INTRAVENOUS | Status: AC
  Administered 2019-01-09: 8 mg via INTRAVENOUS
  Filled 2019-01-09: qty 4

## 2019-01-09 MED ORDER — FENTANYL CITRATE (PF) 100 MCG/2ML IJ SOLN
INTRAMUSCULAR | Status: AC | PRN
  Administered 2019-01-09 (×2): 50 ug via INTRAVENOUS

## 2019-01-09 MED ORDER — SODIUM CHLORIDE 0.9 % IV SOLN: INTRAVENOUS | Status: DC

## 2019-01-09 MED ORDER — PANTOPRAZOLE SODIUM 40 MG IV SOLR
INTRAVENOUS | Status: AC
  Filled 2019-01-09: qty 40

## 2019-01-09 MED ORDER — LIDOCAINE HCL 1 % IJ SOLN
INTRAMUSCULAR | Status: AC
  Filled 2019-01-09: qty 20

## 2019-01-09 MED ORDER — DEXAMETHASONE SODIUM PHOSPHATE 10 MG/ML IJ SOLN
8.0000 mg | Freq: Once | INTRAMUSCULAR | Status: AC
  Administered 2019-01-09: 8 mg via INTRAVENOUS
  Filled 2019-01-09: qty 1

## 2019-01-09 MED ORDER — LIDOCAINE HCL (PF) 1 % IJ SOLN
INTRAMUSCULAR | Status: AC | PRN
  Administered 2019-01-09: 10 mL

## 2019-01-09 MED ORDER — MIDAZOLAM HCL 2 MG/2ML IJ SOLN
INTRAMUSCULAR | Status: AC | PRN
  Administered 2019-01-09 (×6): 1 mg via INTRAVENOUS

## 2019-01-09 MED ORDER — FLUDEOXYGLUCOSE F - 18 (FDG) INJECTION
22.7800 | Freq: Once | INTRAVENOUS | Status: AC | PRN
  Administered 2019-01-09: 12:00:00 22.78 via INTRAVENOUS

## 2019-01-09 MED ORDER — PANTOPRAZOLE SODIUM 40 MG IV SOLR
40.0000 mg | Freq: Once | INTRAVENOUS | Status: AC
  Administered 2019-01-09: 40 mg via INTRAVENOUS
  Filled 2019-01-09: qty 40

## 2019-01-09 MED ORDER — MIDAZOLAM HCL 2 MG/2ML IJ SOLN
INTRAMUSCULAR | Status: AC
  Filled 2019-01-09: qty 2

## 2019-01-09 MED ORDER — FENTANYL CITRATE (PF) 100 MCG/2ML IJ SOLN
INTRAMUSCULAR | Status: AC
  Filled 2019-01-09: qty 4

## 2019-01-09 NOTE — H&P (Signed)
Referring Physician(s): Feng,Y  Supervising Physician: Sandi Mariscal  Patient Status:  [facility]  Chief Complaint: Multifocal hepatocellular carcinoma   Subjective: Patient familiar to IR service from prior right hepatic lobe mass biopsy on 12/23/2017, segment 4A left lobe hepatocellular carcinoma chemoembolization on 03/18/2018 and pre Y 90 hepatic/visceral arteriogram with embolization on 08/26/2018.  He has a history of hepatitis C and multifocal hepatocellular carcinoma .  He is status post Y 90 hepatic radioembolization to the right lobe on 09/13/2018.  He presents today for Y 90 hepatic radioembolization of the left lobe.  He currently denies fever, headache, chest pain, dyspnea, cough, back pain, nausea, vomiting or bleeding.  He does have upper abdominal discomfort.  Past Medical History:  Diagnosis Date  . Cancer ([place]) 2019   liver cancer  . Chest pain    pt states when he turned over on other side aided in relief  . Diabetes mellitus   . Dyspnea    increased activity  . Elevated cholesterol   . Hepatitis C   . Hypertension    Past Surgical History:  Procedure Laterality Date  . I&D EXTREMITY  06/28/2011   Procedure: IRRIGATION AND DEBRIDEMENT EXTREMITY;  Surgeon: Linna Hoff;  Location: [facility];  Service: Orthopedics;  Laterality: Left;  with application of wound vac  . INCISION AND DRAINAGE OF WOUND  07/02/2011   Procedure: IRRIGATION AND DEBRIDEMENT WOUND;  Surgeon: Linna Hoff;  Location: [facility];  Service: Orthopedics;  Laterality: Left;  Irrigation and Debridement of left arm wound with  Skin Grafting from left thigh   . IR ANGIOGRAM SELECTIVE EACH ADDITIONAL VESSEL  03/18/2018  . IR ANGIOGRAM SELECTIVE EACH ADDITIONAL VESSEL  08/26/2018  . IR ANGIOGRAM SELECTIVE EACH ADDITIONAL VESSEL  08/26/2018  . IR ANGIOGRAM SELECTIVE EACH ADDITIONAL VESSEL  08/26/2018  . IR ANGIOGRAM SELECTIVE EACH ADDITIONAL VESSEL  08/26/2018  . IR ANGIOGRAM SELECTIVE EACH ADDITIONAL VESSEL   09/13/2018  . IR ANGIOGRAM VISCERAL SELECTIVE  03/18/2018  . IR ANGIOGRAM VISCERAL SELECTIVE  03/18/2018  . IR ANGIOGRAM VISCERAL SELECTIVE  08/26/2018  . IR ANGIOGRAM VISCERAL SELECTIVE  08/26/2018  . IR ANGIOGRAM VISCERAL SELECTIVE  09/13/2018  . IR EMBO ARTERIAL NOT HEMORR HEMANG INC GUIDE ROADMAPPING  08/26/2018  . IR EMBO TUMOR ORGAN ISCHEMIA INFARCT INC GUIDE ROADMAPPING  03/18/2018  . IR EMBO TUMOR ORGAN ISCHEMIA INFARCT INC GUIDE ROADMAPPING  09/13/2018  . IR RADIOLOGIST EVAL & MGMT  02/16/2018  . IR RADIOLOGIST EVAL & MGMT  07/12/2018  . IR US GUIDE VASC ACCESS RIGHT  03/18/2018  . IR US GUIDE VASC ACCESS RIGHT  08/26/2018  . IR US GUIDE VASC ACCESS RIGHT  09/13/2018  . JOINT REPLACEMENT    . PARTIAL HIP ARTHROPLASTY         Allergies: Gadolinium derivatives and Multihance [gadobenate]  Medications: Prior to Admission medications   Medication Sig Start Date End Date Taking? Authorizing Provider  glipiZIDE (GLUCOTROL) 10 MG tablet TAKE 1 TABLET(10 MG) BY MOUTH TWICE DAILY BEFORE A MEAL 10/21/18  Yes Lanae Boast, FNP  hydrochlorothiazide (HYDRODIURIL) 25 MG tablet TAKE 1 TABLET(25 MG) BY MOUTH DAILY 10/31/18  Yes Lanae Boast, FNP  insulin glargine (LANTUS) 100 UNIT/ML injection Inject 0.3 mLs (30 Units total) into the skin at bedtime. 10/21/18  Yes Lanae Boast, FNP  lisinopril (PRINIVIL,ZESTRIL) 20 MG tablet Take 1 tablet (20 mg total) by mouth daily. 10/06/18  Yes Lanae Boast, FNP  metFORMIN (GLUCOPHAGE) 1000 MG tablet TAKE 1 TABLET(1000  MG) BY MOUTH TWICE DAILY 10/21/18  Yes Lanae Boast, FNP  ondansetron (ZOFRAN) 4 MG tablet TAKE 1 TABLET(4 MG) BY MOUTH EVERY 8 HOURS AS NEEDED FOR NAUSEA OR VOMITING 11/28/18  Yes Lanae Boast, FNP  pravastatin (PRAVACHOL) 80 MG tablet Take 1 tablet (80 mg total) by mouth daily. 10/21/18  Yes Lanae Boast, FNP  cyclobenzaprine (FLEXERIL) 10 MG tablet Take 1/2 to 1 whole tablet by mouth every 8 hours as needed for muscle spasms. 07/22/18    Katy Apo, NP  diclofenac (VOLTAREN) 75 MG EC tablet Take 1 tablet (75 mg total) by mouth 2 (two) times daily as needed for moderate pain. 07/22/18   Katy Apo, NP  gabapentin (NEURONTIN) 300 MG capsule Take 1 capsule (300 mg total) by mouth 3 (three) times daily. 10/21/18   Lanae Boast, FNP  glucose blood test strip Check blood sugar 3 times per day prior to meals. Use as instructed 11/02/18   Lanae Boast, FNP  Insulin Pen Needle (PEN NEEDLES) 32G X 4 MM MISC 1 packet by Does not apply route 3 (three) times daily as needed. 11/02/18   Lanae Boast, FNP  senna (SENOKOT) 8.6 MG tablet Take 1 tablet by mouth as needed for constipation.    [provider]  Sofosbuvir-Velpatasvir (EPCLUSA) 400-100 MG TABS Take 1 tablet by mouth daily. 11/16/17   Thayer Headings, MD  Syringe, Disposable, (B-D 30CC SYRINGE) 30 ML MISC 1 Syringe by Does not apply route as directed. 10/06/18   Lanae Boast, FNP  traMADol (ULTRAM) 50 MG tablet TAKE 1 TABLET(50 MG) BY MOUTH EVERY 8 HOURS AS NEEDED 12/01/18   Lanae Boast, FNP     Vital Signs: BP 139/90 (BP Location: Right Arm)   Pulse 74   Temp 98.5 F (36.9 C) (Oral)   Resp 18   SpO2 100%   Physical Exam awake, alert.  Chest clear to auscultation bilaterally.  Heart with regular rate and rhythm.  Abdomen soft, positive bowel sounds, mildly tender upper abdominal region.  No significant lower extremity edema.  Imaging: No results found.  Labs:  CBC: Recent Labs    07/12/18 1454 08/26/18 0825 09/13/18 0810 01/02/19 0824  WBC 5.1 5.3 5.4 3.2*  HGB 14.5 14.7 13.9 15.3  HCT 44.7 46.0 43.7 45.6  PLT 292 253 315 262    COAGS: Recent Labs    03/18/18 0800 08/26/18 0825 09/13/18 0810 01/02/19 0824  INR 0.93 0.94 0.91 1.0  APTT 32  --   --   --     BMP: Recent Labs    07/12/18 1454 08/26/18 0825 09/13/18 0810 01/02/19 0824  NA 142 135 142 136  K 4.6 3.3* 4.1 4.5  CL 107 97* 108 99  CO2 28 27 25 28   GLUCOSE 85 111*  112* 158*  BUN 19 8 18 19   CALCIUM 9.9 9.5 9.5 10.1  CREATININE 1.22 0.99 1.16 1.20  GFRNONAA >60 >60 >60 69  GFRAA >60 >60 >60 80    LIVER FUNCTION TESTS: Recent Labs    03/19/18 0433 07/12/18 1454 08/26/18 0825 09/13/18 0810 01/02/19 0824  BILITOT 0.7 0.4 0.7 0.5 0.8  AST 60* 46* 94* 71* 105*  ALT 82* 70* 118* 78* 89*  ALKPHOS 51 68 54 51  --   PROT 6.5 7.4 8.0 7.6 7.4  ALBUMIN 3.5 3.7 4.3 4.1  --     Assessment and Plan: Patient with history of hepatitis C and multifocal hepatocellular carcinoma, status post segment 4A left  lobe hepatocellular carcinoma chemoembolization on 03/18/2018 and right hepatic lobe Y 90 radioembolization on 09/13/2018.  He presents today for Y 90 hepatic radioembolization of the left lobe.Risks and benefits of procedure were discussed with the patient including, but not limited to bleeding, infection, vascular injury or contrast induced renal failure.  This interventional procedure involves the use of X-rays and because of the nature of the planned procedure, it is possible that we will have prolonged use of X-ray fluoroscopy.  Potential radiation risks to you include (but are not limited to) the following: - A slightly elevated risk for cancer  several years later in life. This risk is typically less than 0.5% percent. This risk is low in comparison to the normal incidence of human cancer, which is 33% for women and 50% for men according to the [place]. - Radiation induced injury can include skin redness, resembling a rash, tissue breakdown / ulcers and hair loss (which can be temporary or permanent).   The likelihood of either of these occurring depends on the difficulty of the procedure and whether you are sensitive to radiation due to previous procedures, disease, or genetic conditions.   IF your procedure requires a prolonged use of radiation, you will be notified and given written instructions for further action.  It is your  responsibility to monitor the irradiated area for the 2 weeks following the procedure and to notify your physician if you are concerned that you have suffered a radiation induced injury.    All of the patient's questions were answered, patient is agreeable to proceed.  Consent signed and in chart.      Electronically Signed: D. Rowe Robert, PA-C 01/09/2019, 8:47 AM   I spent a total of 25 minutes at the the patient's bedside AND on the patient's hospital floor or unit, greater than 50% of which was counseling/coordinating care for hepatic/visceral arteriogram with Y 90 hepatic radioembolization of the left lobe

## 2019-01-09 NOTE — Discharge Instructions (Addendum)
Moderate Conscious Sedation, Adult, Care After These instructions provide you with information about caring for yourself after your procedure. Your health care provider may also give you more specific instructions. Your treatment has been planned according to current medical practices, but problems sometimes occur. Call your health care provider if you have any problems or questions after your procedure. What can I expect after the procedure? After your procedure, it is common:  To feel sleepy for several hours.  To feel clumsy and have poor balance for several hours.  To have poor judgment for several hours.  To vomit if you eat too soon. Follow these instructions at home: For at least 24 hours after the procedure:   Do not: ? Participate in activities where you could fall or become injured. ? Drive. ? Use heavy machinery. ? Drink alcohol. ? Take sleeping pills or medicines that cause drowsiness. ? Make important decisions or sign legal documents. ? Take care of children on your own.  Rest. Eating and drinking  Follow the diet recommended by your health care provider.  If you vomit: ? Drink water, juice, or soup when you can drink without vomiting. ? Make sure you have little or no nausea before eating solid foods. General instructions  Have a responsible adult stay with you until you are awake and alert.  Take over-the-counter and prescription medicines only as told by your health care provider.  If you smoke, do not smoke without supervision.  Keep all follow-up visits as told by your health care provider. This is important. Contact a health care provider if:  You keep feeling nauseous or you keep vomiting.  You feel light-headed.  You develop a rash.  You have a fever. Get help right away if:  You have trouble breathing. This information is not intended to replace advice given to you by your health care provider. Make sure you discuss any questions you have  with your health care provider. Document Released: 05/10/2013 Document Revised: 12/23/2015 Document Reviewed: 11/09/2015 Elsevier Interactive Patient Education  2019 [place].   Femoral (right groin) Site Care This sheet gives you information about how to care for yourself after your procedure. Your health care provider may also give you more specific instructions. If you have problems or questions, contact your health care provider. What can I expect after the procedure? After the procedure, it is common to have:  Bruising that usually fades within 1-2 weeks.  Tenderness at the site. Follow these instructions at home: Wound care  Follow instructions from your health care provider about how to take care of your insertion site. Make sure you: ? Wash your hands with soap and water before you change your bandage (dressing). If soap and water are not available, use hand sanitizer. ? Change your dressing as told by your health care provider.  May remove dressing and bathe or shower in 24 to 48 hours.  Keep site clean and dry, may replace dressing with bandaid as needed. ? Leave stitches (sutures), skin glue, or adhesive strips in place. These skin closures may need to stay in place for 2 weeks or longer. If adhesive strip edges start to loosen and curl up, you may trim the loose edges. Do not remove adhesive strips completely unless your health care provider tells you to do that.  Do not take baths, swim, or use a hot tub until your health care provider approves. Site must be healing well and scabbed over well.  You may shower 24-48 hours  after the procedure or as told by your health care provider. ? Gently wash the site with plain soap and water. ? Pat the area dry with a clean towel. ? Do not rub the site. This may cause bleeding.  Do not apply powder or lotion to the site. Keep the site clean and dry.  Check your femoral site every day for signs of infection. Check for: ? Redness,  swelling, or pain. ? Fluid or blood. ? Warmth. ? Pus or a bad smell. Activity  For the first 2-3 days after your procedure, or as long as directed: ? Avoid climbing stairs as much as possible. ? Do not squat.  Do not lift anything that is heavier than 10 lb (4.5 kg) for one week, greater weight for 10 days, until your health care provider says that it is safe.  Rest as directed. ? Avoid sitting for a long time without moving. Get up to take short walks every 1-2 hours.  Do not drive for 24 hours if you were given a medicine to help you relax (sedative). General instructions  Take over-the-counter and prescription medicines only as told by your health care provider.  Keep all follow-up visits as told by your health care provider. This is important. Contact a health care provider if you have:  A fever or chills.  You have redness, swelling, or pain around your insertion site. Get help right away if:  The catheter insertion area swells very fast.  You pass out.  You suddenly start to sweat or your skin gets clammy.  The catheter insertion area is bleeding, and the bleeding does not stop when you hold steady pressure on the area.  The area near or just beyond the catheter insertion site becomes pale, cool, tingly, or numb. These symptoms may represent a serious problem that is an emergency. Do not wait to see if the symptoms will go away. Get medical help right away. Call your local emergency services (911 in the [place] Do not drive yourself to the hospital. Summary  After the procedure, it is common to have bruising that usually fades within 1-2 weeks.  Check your femoral site every day for signs of infection.  Do not lift anything that is heavier than 10 lb (4.5 kg), or the limit that you are told, until your health care provider says that it is safe. This information is not intended to replace advice given to you by your health care provider. Make sure you discuss any  questions you have with your health care provider. Document Released: 03/23/2014 Document Revised: 08/02/2017 Document Reviewed: 08/02/2017 Elsevier Interactive Patient Education  2019 [place] Radioembolization Discharge Instructions  You have been given a radioactive material during your procedure.  While it is safe for you to be discharged home from the hospital, you need to proceed directly home.    Do not use public transportation, including air travel, lasting more than 2 hours for 1 week.  Avoid crowded public places for 1 week.  Adult visitors should try to avoid close contact with you for 1 week.    Children and pregnant females should not visit or have close contact with you for 1 week.  Items that you touch are not radioactive.  Do not sleep in the same bed as your partner for 1 week, and a condom should be used for sexual activity during the first 24 hours.  Your blood may be radioactive and caution should be used if any bleeding  occurs during the recovery period.  Body fluids may be radioactive for 24 hours.  Wash your hands after voiding.  Men should sit to urinate.  Dispose of any soiled materials (flush down toilet or place in trash at home) during the first day.  Drink 6 to 8 glasses of fluids per day for 5 days to hydrate yourself.  If you need to see a doctor during the first week, you must let them know that you were treated with yttrium-90 microspheres, and will be slightly radioactive.  They can call Interventional Radiology [phone] with any questions.

## 2019-01-09 NOTE — Procedures (Signed)
Interventional Radiology Procedure Note  Procedure: Celiac and left hepatic arteriography and Y-90 radioembolization of left lobe of liver   Complications: None  Estimated Blood Loss: < 10 mL  Findings: Left hepatic artery supply again predominantly via accessory left hepatic artery off of celiac trunk. Previously embolized left gastric supply remains occluded. Y-90 radioembolization performed with SIR spheres with dose of 24 mCi. Delivery successful.  Plan: NM imaging followed by 4 hour recovery.  Venetia Night. Kathlene Cote, M.D Pager:  [phone]

## 2019-01-10 ENCOUNTER — Telehealth: Payer: Self-pay | Admitting: Hematology

## 2019-01-10 NOTE — Telephone Encounter (Signed)
Called patient per sch msg to see if he wants to keep or postpone 6/19 appt. Spoke with patient, he will keep appt as is. No changes were made

## 2019-01-19 ENCOUNTER — Other Ambulatory Visit: Payer: Self-pay | Admitting: Family Medicine

## 2019-01-19 DIAGNOSIS — C220 Liver cell carcinoma: Secondary | ICD-10-CM

## 2019-01-19 NOTE — Progress Notes (Signed)
[facility]   Telephone:([phone] Fax:(336) [phone]   Clinic Follow up Note   Patient Care Team: Lanae Boast, FNP as PCP - General (Family Medicine)  Date of Service:  01/20/2019  CHIEF COMPLAINT: F/u of [name]  SUMMARY OF ONCOLOGIC HISTORY: Oncology History  Hepatocellular carcinoma ([place])  12/07/2017 Initial Diagnosis   Hepatocellular carcinoma ([place])   12/07/2017 Imaging   US Abdomen 12/07/17 IMPRESSION: ULTRASOUND ABDOMEN: Probable 4 mm gallstone in gallbladder.  Multiple hepatic masses largest heterogeneous RIGHT lobe measuring 6.1 x 5.2 x 5.5 cm; metastatic disease and primary tumor not excluded, recommend further evaluation by MR imaging.  ULTRASOUND HEPATIC ELASTOGRAPHY:  Median hepatic shear wave velocity is calculated at 1.52 m/sec.  Corresponding Metavir fibrosis score is F2 + some F3.  Risk of fibrosis is moderate.  Follow-up: Additional testing appropriate   12/22/2017 PET scan   PET 12/22/17  IMPRESSION: 1. Lesions within the liver have low metabolic activity for size. 2. Dominant lesion in liver does have a focus of metabolic activity which corresponds to a low-density rounded region within the larger lesion. Target this 2 cm focus within the dominant lesion for biopsy. The liver lesions remain concerning for metastatic disease or primary liver carcinoma. Mucinous or cystic carcinomas can have low metabolic activity. 3. Focus of metabolic activity at the level of the hepatic flexure of the colon with some soft tissue thickening not clearly localized. Some additional foci of uptake within colon. This could represent physiologic activity however depending on the biopsy results of the liver recommend colonoscopy.   12/23/2017 Cancer Staging   Staging form: Liver, AJCC 8th Edition - Clinical stage from 12/23/2017: Stage IIIA (cT3, cN0, cM0) - Signed by Truitt Merle, MD on 12/30/2017   12/23/2017 Initial Biopsy   Diagnosis 12/23/17 Liver,  needle/core biopsy, Right lobe - HEPATOCELLULAR CARCINOMA. Microscopic Comment Dr. Lyndon Code has reviewed the case. Dr. Burr Medico was paged on 12/24/2017.     02/18/2018 Imaging   CT AP W WO Contrast 02/18/18  IMPRESSION: 1. Multifocal hepatocellular carcinoma, mildly progressive from baseline MRI 12/10/2017. 2. No evidence of extrahepatic nodal metastases or vascular involvement. 3. Borderline hepatic steatosis. No morphologic changes of underlying cirrhosis or portal hypertension. 4. Cholelithiasis. 5. Mild Aortic Atherosclerosis (ICD10-I70.0).   03/24/2018 Procedure   TACE Chemo ebolization on 03/24/18 with Dr. Kathlene Cote    06/14/2018 Imaging   MRI abdomen 06/14/18  IMPRESSION: 1. Dominant lesion in the central LEFT hepatic lobe (segment 4A) is stable in size, decreased in central enhancement, and with persistent nodular wall enhancement. 2. Multiple additional smaller lesions within LEFT and RIGHT hepatic lobe are increased in size. 3. Potential new lesions on the early arterial phase imaging.   08/16/2018 Imaging   CT Chest 08/16/18   IMPRESSION: Multifocal hepatic masses in this patient with known liver cancer, similar to prior MRI, although poorly evaluated.  No evidence of metastatic disease in the chest.  Aortic Atherosclerosis (ICD10-I70.0) and Emphysema (ICD10-J43.9).   09/13/2018 Procedure   Y90 treatment with Dr. Kathlene Cote on 09/13/18 and 01/09/19      CURRENT THERAPY:  PENDING chemotherapy   INTERVAL HISTORY:  [name] is here for a follow up of [place]. He presents to the clinic alone. His girlfriend was called to be included in the visit today. He notes since recent Y90 he feels fatigue and pain in liver area. He did have this pain before but afterward it hurts much more (8/10). He uses tramadol will only last for 45 minutes  with relief. His girlfriend notes he is more out of breath. He notes he still drinks beer occasionally.  He notes numbness of right arm  occasionally. He attributes this to his blood sugar. He notes he smokes 2 cigarettes a day, not everyday. He does have an intermittent cough.    REVIEW OF SYSTEMS:   Constitutional: Denies fevers, chills or abnormal weight loss (+) fatigue  Eyes: Denies blurriness of vision Ears, nose, mouth, throat, and face: Denies mucositis or sore throat Respiratory: Denies wheezes (+) SOB, occasional cough  Cardiovascular: Denies palpitation, chest discomfort or lower extremity swelling Gastrointestinal:  Denies nausea, heartburn or change in bowel habits (+) RUQ pain 8/10 Skin: Denies abnormal skin rashes Lymphatics: Denies new lymphadenopathy or easy bruising Neurological:Denies numbness, tingling or new weaknesses (+) right arm numbness occasionally.  Behavioral/Psych: Mood is stable, no new changes  All other systems were reviewed with the patient and are negative.  MEDICAL HISTORY:  Past Medical History:  Diagnosis Date  . Cancer ([place]) 2019   liver cancer  . Chest pain    pt states when he turned over on other side aided in relief  . Diabetes mellitus   . Dyspnea    increased activity  . Elevated cholesterol   . Hepatitis C   . Hypertension     SURGICAL HISTORY: Past Surgical History:  Procedure Laterality Date  . I&D EXTREMITY  06/28/2011   Procedure: IRRIGATION AND DEBRIDEMENT EXTREMITY;  Surgeon: Linna Hoff;  Location: [facility];  Service: Orthopedics;  Laterality: Left;  with application of wound vac  . INCISION AND DRAINAGE OF WOUND  07/02/2011   Procedure: IRRIGATION AND DEBRIDEMENT WOUND;  Surgeon: Linna Hoff;  Location: [facility];  Service: Orthopedics;  Laterality: Left;  Irrigation and Debridement of left arm wound with  Skin Grafting from left thigh   . IR ANGIOGRAM SELECTIVE EACH ADDITIONAL VESSEL  03/18/2018  . IR ANGIOGRAM SELECTIVE EACH ADDITIONAL VESSEL  08/26/2018  . IR ANGIOGRAM SELECTIVE EACH ADDITIONAL VESSEL  08/26/2018  . IR ANGIOGRAM SELECTIVE EACH ADDITIONAL  VESSEL  08/26/2018  . IR ANGIOGRAM SELECTIVE EACH ADDITIONAL VESSEL  08/26/2018  . IR ANGIOGRAM SELECTIVE EACH ADDITIONAL VESSEL  09/13/2018  . IR ANGIOGRAM SELECTIVE EACH ADDITIONAL VESSEL  01/09/2019  . IR ANGIOGRAM VISCERAL SELECTIVE  03/18/2018  . IR ANGIOGRAM VISCERAL SELECTIVE  03/18/2018  . IR ANGIOGRAM VISCERAL SELECTIVE  08/26/2018  . IR ANGIOGRAM VISCERAL SELECTIVE  08/26/2018  . IR ANGIOGRAM VISCERAL SELECTIVE  09/13/2018  . IR ANGIOGRAM VISCERAL SELECTIVE  01/09/2019  . IR EMBO ARTERIAL NOT HEMORR HEMANG INC GUIDE ROADMAPPING  08/26/2018  . IR EMBO TUMOR ORGAN ISCHEMIA INFARCT INC GUIDE ROADMAPPING  03/18/2018  . IR EMBO TUMOR ORGAN ISCHEMIA INFARCT INC GUIDE ROADMAPPING  09/13/2018  . IR EMBO TUMOR ORGAN ISCHEMIA INFARCT INC GUIDE ROADMAPPING  01/09/2019  . IR RADIOLOGIST EVAL & MGMT  02/16/2018  . IR RADIOLOGIST EVAL & MGMT  07/12/2018  . IR US GUIDE VASC ACCESS RIGHT  03/18/2018  . IR US GUIDE VASC ACCESS RIGHT  08/26/2018  . IR US GUIDE VASC ACCESS RIGHT  09/13/2018  . IR US GUIDE VASC ACCESS RIGHT  01/09/2019  . JOINT REPLACEMENT    . PARTIAL HIP ARTHROPLASTY      I have reviewed the social history and family history with the patient and they are unchanged from previous note.  ALLERGIES:  is allergic to gadolinium derivatives and multihance [gadobenate].  MEDICATIONS:  Current Outpatient Medications  Medication  Sig Dispense Refill  . cyclobenzaprine (FLEXERIL) 10 MG tablet Take 1/2 to 1 whole tablet by mouth every 8 hours as needed for muscle spasms. 15 tablet 0  . diclofenac (VOLTAREN) 75 MG EC tablet Take 1 tablet (75 mg total) by mouth 2 (two) times daily as needed for moderate pain. 30 tablet 0  . gabapentin (NEURONTIN) 300 MG capsule Take 1 capsule (300 mg total) by mouth 3 (three) times daily. 270 capsule 1  . glipiZIDE (GLUCOTROL) 10 MG tablet TAKE 1 TABLET(10 MG) BY MOUTH TWICE DAILY BEFORE A MEAL 180 tablet 2  . glucose blood test strip Check blood sugar 3 times per day prior to  meals. Use as instructed 100 each 12  . hydrochlorothiazide (HYDRODIURIL) 25 MG tablet TAKE 1 TABLET(25 MG) BY MOUTH DAILY 90 tablet 1  . insulin glargine (LANTUS) 100 UNIT/ML injection Inject 0.3 mLs (30 Units total) into the skin at bedtime. 10 mL 11  . Insulin Pen Needle (PEN NEEDLES) 32G X 4 MM MISC 1 packet by Does not apply route 3 (three) times daily as needed. 100 each 2  . lisinopril (PRINIVIL,ZESTRIL) 20 MG tablet Take 1 tablet (20 mg total) by mouth daily. 90 tablet 1  . metFORMIN (GLUCOPHAGE) 1000 MG tablet TAKE 1 TABLET(1000 MG) BY MOUTH TWICE DAILY 180 tablet 0  . ondansetron (ZOFRAN) 4 MG tablet Take 1 tablet (4 mg total) by mouth every 8 (eight) hours as needed for nausea or vomiting. 30 tablet 0  . pravastatin (PRAVACHOL) 80 MG tablet Take 1 tablet (80 mg total) by mouth daily. 90 tablet 1  . senna (SENOKOT) 8.6 MG tablet Take 1 tablet by mouth as needed for constipation.    . Sofosbuvir-Velpatasvir (EPCLUSA) 400-100 MG TABS Take 1 tablet by mouth daily. 28 tablet 2  . Syringe, Disposable, (B-D 30CC SYRINGE) 30 ML MISC 1 Syringe by Does not apply route as directed. 50 each 5  . traMADol (ULTRAM) 50 MG tablet TAKE 1 TABLET(50 MG) BY MOUTH EVERY 8 HOURS AS NEEDED 30 tablet 0  . oxyCODONE (OXY IR/ROXICODONE) 5 MG immediate release tablet Take 1 tablet (5 mg total) by mouth every 8 (eight) hours as needed for severe pain. 30 tablet 0  . prochlorperazine (COMPAZINE) 10 MG tablet Take 1 tablet (10 mg total) by mouth every 6 (six) hours as needed for nausea or vomiting. 30 tablet 1   No current facility-administered medications for this visit.     PHYSICAL EXAMINATION: ECOG PERFORMANCE STATUS: 2 - Symptomatic, <50% confined to bed  Vitals:   01/20/19 1524  BP: 129/87  Pulse: (!) 102  Resp: 18  Temp: 97.8 F (36.6 C)  SpO2: 99%   Filed Weights   01/20/19 1524  Weight: 158 lb 8 oz (71.9 kg)    GENERAL:alert, no distress and comfortable SKIN: skin color, texture, turgor are  normal, no rashes or significant lesions EYES: normal, Conjunctiva are pink and non-injected, sclera clear  NECK: supple, thyroid normal size, non-tender, without nodularity LYMPH:  no palpable lymphadenopathy in the cervical, axillary  LUNGS: clear to auscultation and percussion with normal breathing effort HEART: regular rate & rhythm and no murmurs and no lower extremity edema ABDOMEN:abdomen soft, non-tender and normal bowel sounds (+) hepatomegaly, palpable 3cm below ribcage, moderately tender Musculoskeletal:no cyanosis of digits and no clubbing  NEURO: alert & oriented x 3 with fluent speech, no focal motor/sensory deficits  LABORATORY DATA:  I have reviewed the data as listed CBC Latest Ref Rng & Units  01/20/2019 01/09/2019 01/02/2019  WBC 4.0 - 10.5 K/uL 3.7(L) 3.2(L) 3.2(L)  Hemoglobin 13.0 - 17.0 g/dL 14.5 14.3 15.3  Hematocrit 39.0 - 52.0 % 43.8 43.4 45.6  Platelets 150 - 400 K/uL 247 228 262     CMP Latest Ref Rng & Units 01/20/2019 01/09/2019 01/02/2019  Glucose 70 - 99 mg/dL 165(H) 196(H) 158(H)  BUN 6 - 20 mg/dL 6 15 19   Creatinine 0.61 - 1.24 mg/dL 1.01 0.94 1.20  Sodium 135 - 145 mmol/L 136 137 136  Potassium 3.5 - 5.1 mmol/L 4.6 3.9 4.5  Chloride 98 - 111 mmol/L 98 103 99  CO2 22 - 32 mmol/L 25 24 28   Calcium 8.9 - 10.3 mg/dL 9.9 9.2 10.1  Total Protein 6.5 - 8.1 g/dL 7.5 7.2 7.4  Total Bilirubin 0.3 - 1.2 mg/dL 1.0 0.5 0.8  Alkaline Phos 38 - 126 U/L 93 68 -  AST 15 - 41 U/L 109(H) 85(H) 105(H)  ALT 0 - 44 U/L 85(H) 96(H) 89(H)      RADIOGRAPHIC STUDIES: I have personally reviewed the radiological images as listed and agreed with the findings in the report. No results found.   ASSESSMENT & PLAN:  [name] is a 53 y.o. male with   1.Hepatocellular carcinoma,stage IIIA (cT3N0M0) -He was diagnosed in 12/2017. He is s/p TACE Chemo embolization on 03/24/18 with Dr. Kathlene Cote.  -His repeat MRI in 06/14/18 showed disease progression in liver, except the treated  lesion in 4A.  No extrahepatic disease on the recent abdominal MRI. We discussed that his liver cancer is not curable at this stage, and goal of therapy is to control disease and prolong his life. -He underwent Y90 treatments on 09/13/18 and 01/09/19.  -I discussed we usually do not repeat liver scan until 3 months after last treatment. We would rescan sooner if her has significant pain or symptoms.  -After latest Y90, he has worsening RUQ abdominal pain. He has hepatomegaly with significant tenderness on exam today. We plan to scan him soon as this may also be related to disease progression.  -Labs reviewed, CBC and CMP WNL except WBC 3.7, ALT 85, AST 109. AFP still pending. -We discussed systemic therapy options, including oral TKI sorafenib and Lenvatinib, immunotherapy nivolumab or pembrolizumab, or the combination of atezolizumab and bevacizumab which showed good response rate in the recent clinical trial.  I discussed potential benefit and side effects, he is interested. -I will discussed with Dr. Kathlene Cote and see him back after his abdominal MRI  2. Abdominal pain, nausea and constipation.  -Constipation currently resolved. He will watch for constipation on pain medication. -Most relief with Zofran for nausea. I refilled Zofran today (01/20/19) -He had RUQ abdominal pain worsen 3-6 weeks after first Y90. After his second treatment his pain worsened further, now 8/10 -Tramadol is no longer controlling his pain. He will stop Tramadol and I will call in oxycodone 30m q8hours. He will start at half tablet only for pain above 6/10. He can use Tylenol or ibuprofen for mild pain.  -I discussed being cautious and only use as needed for severe pain, as this can lead to dependence. He understands.  -Will scan him sooneras pain may not be related to treatment alone but possible disease progression.   3. Hepatitis C, untreated  -He was diagnosed with Hepatitis Cin 09/2017, however he is unsure of how  long he has had hepatitis C prior to his diagnosis.  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  -Dr. Linus Salmons does not recommend treatment until Cancer is controlled.  -I encouraged the patient to maintain follow up with Dr. Linus Salmons. -No significant liver cirrhosis on MRI.  4. 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 -I advised the patient to maintain follow up with his PCP  5. Alcohol and smoking cessation -I advised and recommended with the patient that he does discontinue use of ETOH due to the patient already with multiple liver issues.  -He does smoke cigarettes and he has a 40 year history. I repeatedly advised that the patient discontinue use of smoking cigarettes at this time due to the increase risk of lung cancer. -He has reduced smoking to 1 cigarettes a day. He has mild SOB and cough. I again encouraged him to completely quit.  -He reduced drinking to beer occasionally. I again recommended complete cessation. He understands.    Plan -I prescribed Oxycodone 17m #30 as needed for severe pain today  -I will discuss with Dr. YKathlene Coteand likely order restaging abdominal MRI in the next 2-3 weeks and see him back after the scan     No problem-specific Assessment & Plan notes found for this encounter.   No orders of the defined types were placed in this encounter.  All questions were answered. The patient knows to call the clinic with any problems, questions or concerns. No barriers to learning was detected. I spent 20 minutes counseling the patient face to face. The total time spent in the appointment was 25 minutes and more than 50% was on counseling and review of test results     YTruitt Merle MD 01/20/2019   I, AJoslyn Devon am acting as scribe for YTruitt Merle MD.   I have reviewed the above documentation for accuracy and completeness, and I agree with the  above.

## 2019-01-20 ENCOUNTER — Other Ambulatory Visit: Payer: Self-pay

## 2019-01-20 ENCOUNTER — Telehealth: Payer: Self-pay | Admitting: Family Medicine

## 2019-01-20 ENCOUNTER — Inpatient Hospital Stay (HOSPITAL_BASED_OUTPATIENT_CLINIC_OR_DEPARTMENT_OTHER): Payer: Medicare Other | Admitting: Hematology

## 2019-01-20 ENCOUNTER — Encounter: Payer: Self-pay | Admitting: Hematology

## 2019-01-20 ENCOUNTER — Inpatient Hospital Stay: Payer: Medicare Other | Attending: Hematology

## 2019-01-20 VITALS — BP 129/87 | HR 102 | Temp 97.8°F | Resp 18 | Ht 67.0 in | Wt 158.5 lb

## 2019-01-20 DIAGNOSIS — Z791 Long term (current) use of non-steroidal anti-inflammatories (NSAID): Secondary | ICD-10-CM

## 2019-01-20 DIAGNOSIS — C220 Liver cell carcinoma: Secondary | ICD-10-CM

## 2019-01-20 DIAGNOSIS — J439 Emphysema, unspecified: Secondary | ICD-10-CM | POA: Insufficient documentation

## 2019-01-20 DIAGNOSIS — I10 Essential (primary) hypertension: Secondary | ICD-10-CM | POA: Diagnosis not present

## 2019-01-20 DIAGNOSIS — K5900 Constipation, unspecified: Secondary | ICD-10-CM

## 2019-01-20 DIAGNOSIS — F17210 Nicotine dependence, cigarettes, uncomplicated: Secondary | ICD-10-CM | POA: Diagnosis not present

## 2019-01-20 DIAGNOSIS — B1920 Unspecified viral hepatitis C without hepatic coma: Secondary | ICD-10-CM

## 2019-01-20 DIAGNOSIS — Z794 Long term (current) use of insulin: Secondary | ICD-10-CM

## 2019-01-20 DIAGNOSIS — E7800 Pure hypercholesterolemia, unspecified: Secondary | ICD-10-CM | POA: Diagnosis not present

## 2019-01-20 DIAGNOSIS — Z79899 Other long term (current) drug therapy: Secondary | ICD-10-CM | POA: Insufficient documentation

## 2019-01-20 DIAGNOSIS — G893 Neoplasm related pain (acute) (chronic): Secondary | ICD-10-CM | POA: Diagnosis not present

## 2019-01-20 DIAGNOSIS — E119 Type 2 diabetes mellitus without complications: Secondary | ICD-10-CM | POA: Diagnosis not present

## 2019-01-20 LAB — CBC WITH DIFFERENTIAL (CANCER CENTER ONLY)
Abs Immature Granulocytes: 0.02 10*3/uL (ref 0.00–0.07)
Basophils Absolute: 0 10*3/uL (ref 0.0–0.1)
Basophils Relative: 0 %
Eosinophils Absolute: 0 10*3/uL (ref 0.0–0.5)
Eosinophils Relative: 1 %
HCT: 43.8 % (ref 39.0–52.0)
Hemoglobin: 14.5 g/dL (ref 13.0–17.0)
Immature Granulocytes: 1 %
Lymphocytes Relative: 5 %
Lymphs Abs: 0.2 10*3/uL — ABNORMAL LOW (ref 0.7–4.0)
MCH: 29.4 pg (ref 26.0–34.0)
MCHC: 33.1 g/dL (ref 30.0–36.0)
MCV: 88.7 fL (ref 80.0–100.0)
Monocytes Absolute: 0.5 10*3/uL (ref 0.1–1.0)
Monocytes Relative: 13 %
Neutro Abs: 3 10*3/uL (ref 1.7–7.7)
Neutrophils Relative %: 80 %
Platelet Count: 247 10*3/uL (ref 150–400)
RBC: 4.94 MIL/uL (ref 4.22–5.81)
RDW: 12.9 % (ref 11.5–15.5)
WBC Count: 3.7 10*3/uL — ABNORMAL LOW (ref 4.0–10.5)
nRBC: 0 % (ref 0.0–0.2)

## 2019-01-20 LAB — CMP (CANCER CENTER ONLY)
ALT: 85 U/L — ABNORMAL HIGH (ref 0–44)
AST: 109 U/L — ABNORMAL HIGH (ref 15–41)
Albumin: 3.7 g/dL (ref 3.5–5.0)
Alkaline Phosphatase: 93 U/L (ref 38–126)
Anion gap: 13 (ref 5–15)
BUN: 6 mg/dL (ref 6–20)
CO2: 25 mmol/L (ref 22–32)
Calcium: 9.9 mg/dL (ref 8.9–10.3)
Chloride: 98 mmol/L (ref 98–111)
Creatinine: 1.01 mg/dL (ref 0.61–1.24)
GFR, Est AFR Am: >60 mL/min (ref >60)
GFR, Estimated: >60 mL/min (ref >60)
Glucose, Bld: 165 mg/dL — ABNORMAL HIGH (ref 70–99)
Potassium: 4.6 mmol/L (ref 3.5–5.1)
Sodium: 136 mmol/L (ref 135–145)
Total Bilirubin: 1 mg/dL (ref 0.3–1.2)
Total Protein: 7.5 g/dL (ref 6.5–8.1)

## 2019-01-20 MED ORDER — PROCHLORPERAZINE MALEATE 10 MG PO TABS: 10.0000 mg | ORAL_TABLET | Freq: Four times a day (QID) | ORAL | 1 refills | Status: DC | PRN

## 2019-01-20 MED ORDER — ONDANSETRON HCL 4 MG PO TABS: 4.0000 mg | ORAL_TABLET | Freq: Three times a day (TID) | ORAL | 0 refills | Status: DC | PRN

## 2019-01-20 MED ORDER — OXYCODONE HCL 5 MG PO TABS: 5.0000 mg | ORAL_TABLET | Freq: Three times a day (TID) | ORAL | 0 refills | Status: DC | PRN

## 2019-01-20 NOTE — Addendum Note (Signed)
Addended by: Truitt Merle on: 01/20/2019 05:34 PM   Modules accepted: Orders

## 2019-01-20 NOTE — Telephone Encounter (Signed)
This has been sent into office.

## 2019-01-21 LAB — AFP TUMOR MARKER: AFP, Serum, Tumor Marker: 32.5 ng/mL — ABNORMAL HIGH (ref 0.0–8.3)

## 2019-01-23 ENCOUNTER — Telehealth: Payer: Self-pay | Admitting: *Deleted

## 2019-01-23 ENCOUNTER — Other Ambulatory Visit: Payer: Self-pay | Admitting: *Deleted

## 2019-01-23 ENCOUNTER — Telehealth: Payer: Self-pay | Admitting: Hematology

## 2019-01-23 DIAGNOSIS — C220 Liver cell carcinoma: Secondary | ICD-10-CM

## 2019-01-23 MED ORDER — PREDNISONE 50 MG PO TABS: ORAL_TABLET | ORAL | 0 refills | Status: DC

## 2019-01-23 NOTE — Telephone Encounter (Signed)
-----   Message from Truitt Merle, MD sent at 01/23/2019  1:33 PM EDT ----- Regarding: RE: MRI Abdominal Thu, could you call in prednisone prep for his MRI contrast allergy? And remind him to call radiology back to schedule his scan? Thanks   Krista Blue  ----- Message ----- From: Augustine Radar Sent: 01/23/2019  11:53 AM EDT To: Truitt Merle, MD Subject: FW: MRI Abdominal                               ----- Message ----- From: Lenore Cordia Sent: 01/23/2019  11:36 AM EDT To: April H Pait, Centralized Scheduling Subject: RE: MRI Abdominal                              I left a vox mail to return cal ,,, FYI patient is allergic to Gadolinium contrast (MRI contrast) patient will need a 13 hours prep  ----- Message ----- From: Augustine Radar Sent: 01/23/2019  11:06 AM EDT To: Centralized Scheduling Subject: FW: MRI Abdominal                               ----- Message ----- From: Nilda Calamity Sent: 01/23/2019   9:39 AM EDT To: Jonnie Finner, RN, April H Pait, # Subject: MRI Abdominal                                  Good morning,  No authorization required.  Referral approved in Epic .   Appointment  can now be scheduled   Please advise if any additional assistance is required.  Thank you, Pashon D ----- Message ----- From: Elliot Gault Sent: 01/21/2019   8:23 AM EDT To: Nilda Calamity  Hi Pashon - can you assist Dr. Burr Medico with this request?  Thank you,  Brandi ----- Message ----- From: Truitt Merle, MD Sent: 01/20/2019   5:35 PM EDT To: Jonnie Finner, RN, April H Pait, #  Brandi, please get pre-auth for his abdominal MRI  April, please schedule it in 2 weeks when it's approved, thanks  Krista Blue

## 2019-01-23 NOTE — Telephone Encounter (Signed)
Spoke with Rod Holler, and instructed her to schedule pt's MRI soon before office visit on 02/09/19.  Gave her radiology phone number.  Script for Prednisone 50 mg prep before MRI sent in to pt's pharmacy per SO request.

## 2019-01-23 NOTE — Telephone Encounter (Signed)
Scheduled appt per 6/19 sch message. Spoke with patient and he is aware of his appt date and time.

## 2019-01-29 ENCOUNTER — Other Ambulatory Visit: Payer: Self-pay

## 2019-01-29 ENCOUNTER — Emergency Department (HOSPITAL_COMMUNITY)
Admission: EM | Admit: 2019-01-29 | Discharge: 2019-01-29 | Disposition: A | Discharge status: Home / Self Care | Payer: Medicare Other | Attending: Emergency Medicine | Admitting: Emergency Medicine

## 2019-01-29 ENCOUNTER — Encounter (HOSPITAL_COMMUNITY): Payer: Self-pay | Admitting: Emergency Medicine

## 2019-01-29 ENCOUNTER — Emergency Department (HOSPITAL_COMMUNITY): Payer: Medicare Other

## 2019-01-29 DIAGNOSIS — R1011 Right upper quadrant pain: Secondary | ICD-10-CM

## 2019-01-29 DIAGNOSIS — Z7984 Long term (current) use of oral hypoglycemic drugs: Secondary | ICD-10-CM | POA: Diagnosis not present

## 2019-01-29 DIAGNOSIS — E119 Type 2 diabetes mellitus without complications: Secondary | ICD-10-CM | POA: Insufficient documentation

## 2019-01-29 DIAGNOSIS — K7689 Other specified diseases of liver: Secondary | ICD-10-CM | POA: Diagnosis not present

## 2019-01-29 DIAGNOSIS — K8020 Calculus of gallbladder without cholecystitis without obstruction: Secondary | ICD-10-CM | POA: Diagnosis not present

## 2019-01-29 DIAGNOSIS — I10 Essential (primary) hypertension: Secondary | ICD-10-CM | POA: Insufficient documentation

## 2019-01-29 DIAGNOSIS — R1084 Generalized abdominal pain: Secondary | ICD-10-CM | POA: Diagnosis not present

## 2019-01-29 DIAGNOSIS — F17210 Nicotine dependence, cigarettes, uncomplicated: Secondary | ICD-10-CM | POA: Diagnosis not present

## 2019-01-29 DIAGNOSIS — C220 Liver cell carcinoma: Secondary | ICD-10-CM | POA: Insufficient documentation

## 2019-01-29 DIAGNOSIS — R112 Nausea with vomiting, unspecified: Secondary | ICD-10-CM

## 2019-01-29 DIAGNOSIS — Z79899 Other long term (current) drug therapy: Secondary | ICD-10-CM | POA: Insufficient documentation

## 2019-01-29 LAB — CBC WITH DIFFERENTIAL/PLATELET
Abs Immature Granulocytes: 0.03 10*3/uL (ref 0.00–0.07)
Basophils Absolute: 0 10*3/uL (ref 0.0–0.1)
Basophils Relative: 1 %
Eosinophils Absolute: 0 10*3/uL (ref 0.0–0.5)
Eosinophils Relative: 1 %
HCT: 46.2 % (ref 39.0–52.0)
Hemoglobin: 15 g/dL (ref 13.0–17.0)
Immature Granulocytes: 1 %
Lymphocytes Relative: 2 %
Lymphs Abs: 0.1 10*3/uL — ABNORMAL LOW (ref 0.7–4.0)
MCH: 29 pg (ref 26.0–34.0)
MCHC: 32.5 g/dL (ref 30.0–36.0)
MCV: 89.2 fL (ref 80.0–100.0)
Monocytes Absolute: 0.4 10*3/uL (ref 0.1–1.0)
Monocytes Relative: 8 %
Neutro Abs: 3.6 10*3/uL (ref 1.7–7.7)
Neutrophils Relative %: 87 %
Platelets: 261 10*3/uL (ref 150–400)
RBC: 5.18 MIL/uL (ref 4.22–5.81)
RDW: 12.5 % (ref 11.5–15.5)
WBC: 4.2 10*3/uL (ref 4.0–10.5)
nRBC: 0 % (ref 0.0–0.2)

## 2019-01-29 LAB — COMPREHENSIVE METABOLIC PANEL
ALT: 76 U/L — ABNORMAL HIGH (ref 0–44)
AST: 83 U/L — ABNORMAL HIGH (ref 15–41)
Albumin: 4 g/dL (ref 3.5–5.0)
Alkaline Phosphatase: 87 U/L (ref 38–126)
Anion gap: 14 (ref 5–15)
BUN: 14 mg/dL (ref 6–20)
CO2: 25 mmol/L (ref 22–32)
Calcium: 10.1 mg/dL (ref 8.9–10.3)
Chloride: 96 mmol/L — ABNORMAL LOW (ref 98–111)
Creatinine, Ser: 1 mg/dL (ref 0.61–1.24)
GFR calc Af Amer: >60 mL/min (ref >60)
GFR calc non Af Amer: >60 mL/min (ref >60)
Glucose, Bld: 231 mg/dL — ABNORMAL HIGH (ref 70–99)
Potassium: 4.6 mmol/L (ref 3.5–5.1)
Sodium: 135 mmol/L (ref 135–145)
Total Bilirubin: 1.9 mg/dL — ABNORMAL HIGH (ref 0.3–1.2)
Total Protein: 8.3 g/dL — ABNORMAL HIGH (ref 6.5–8.1)

## 2019-01-29 LAB — URINALYSIS, ROUTINE W REFLEX MICROSCOPIC
Bilirubin Urine: NEGATIVE
Glucose, UA: 50 mg/dL — AB
Hgb urine dipstick: NEGATIVE
Ketones, ur: 20 mg/dL — AB
Leukocytes,Ua: NEGATIVE
Nitrite: NEGATIVE
Protein, ur: NEGATIVE mg/dL
Specific Gravity, Urine: 1.013 (ref 1.005–1.030)
pH: 6 (ref 5.0–8.0)

## 2019-01-29 LAB — LIPASE, BLOOD: Lipase: 30 U/L (ref 11–51)

## 2019-01-29 MED ORDER — ONDANSETRON HCL 4 MG/2ML IJ SOLN
4.0000 mg | Freq: Once | INTRAMUSCULAR | Status: AC
  Administered 2019-01-29: 4 mg via INTRAVENOUS
  Filled 2019-01-29: qty 2

## 2019-01-29 MED ORDER — HYDROMORPHONE HCL 1 MG/ML IJ SOLN
1.0000 mg | Freq: Once | INTRAMUSCULAR | Status: AC
  Administered 2019-01-29: 1 mg via INTRAVENOUS
  Filled 2019-01-29: qty 1

## 2019-01-29 MED ORDER — HYDROCODONE-ACETAMINOPHEN 5-325 MG PO TABS: 1.0000 | ORAL_TABLET | ORAL | 0 refills | Status: DC | PRN

## 2019-01-29 MED ORDER — MORPHINE SULFATE (PF) 4 MG/ML IV SOLN
4.0000 mg | Freq: Once | INTRAVENOUS | Status: AC
  Administered 2019-01-29: 4 mg via INTRAVENOUS
  Filled 2019-01-29: qty 1

## 2019-01-29 MED ORDER — SODIUM CHLORIDE 0.9 % IV BOLUS
1000.0000 mL | Freq: Once | INTRAVENOUS | Status: AC
  Administered 2019-01-29: 1000 mL via INTRAVENOUS

## 2019-01-29 NOTE — Discharge Instructions (Addendum)
It was our pleasure to provide your ER care today - we hope that you feel better.  Take hydrocodone/APAP as need for pain - no driving when taking.  You may take zofran as need for nausea.  Follow up with primary care doctor in the next couple of days if symptoms fail to improve/resolve.  Or call your oncologist for further directions.  Return to ER if worse, new symptoms, fevers, persistent vomiting, intractable pain, other concern.   You were given pain medication in the ER - no driving for the next 6 hours.

## 2019-01-29 NOTE — ED Triage Notes (Signed)
Patient c/o vomiting and abdominal pain since this morning. States hx liver cancer. Last radiation x2 weeks ago.

## 2019-01-29 NOTE — ED Provider Notes (Signed)
6:40 PM-checkout from prior doctor to evaluate after return of CT images.  Images returned at this time.  Clinical Course as of Jan 29 1855  Sun Jan 29, 2019  1840 Normal except elevated glucose and ketones  Urinalysis, Routine w reflex microscopic(!) [EW]  1840 Normal  Lipase, blood [EW]  1840 Normal except elevated glucose, decreased chloride, increased protein, increased AST and ALT, increased total bilirubin  Comprehensive metabolic panel(!) [EW]  3354 Normal  CBC with Differential/Platelet(!) [EW]  1841 No acute abnormality, images reviewed by me  CT Abdomen Pelvis Wo Contrast [EW]    Clinical Course User Index [EW] Daleen Bo, MD     Patient Vitals for the past 24 hrs:  BP Temp Temp src Pulse Resp SpO2  01/29/19 1830 (!) 172/83 - - 73 18 97 %  01/29/19 1700 (!) 185/98 - - 63 16 97 %  01/29/19 1630 (!) 169/104 - - 63 18 99 %  01/29/19 1552 (!) 178/107 - - 84 16 100 %  01/29/19 1500 (!) 181/97 - - 67 18 98 %  01/29/19 1430 (!) 166/86 - - 71 16 98 %  01/29/19 1411 (!) 185/105 - - 72 18 100 %  01/29/19 1330 (!) 187/96 - - 68 16 97 %  01/29/19 1300 (!) 174/94 - - 62 18 100 %  01/29/19 1202 (!) 191/93 98.3 F (36.8 C) Oral 66 16 100 %    6:56 PM Reevaluation with update and discussion. After initial assessment and treatment, an updated evaluation reveals patient is fairly comfortable but would like some more medication prior to discharge.  Findings discussed with patient, as well as his wife, and telephone.  She requests that he be given a different medication for pain because "he does not like oxycodone."  All questions answered.Daleen Bo   Medical Decision Making: Abdominal pain with known liver cancer, status post embolization procedure liver, several weeks ago.  No clear etiology on history.  ED evaluation requiring CT imaging and labs.  Patient was treated with narcotics several times to control pain.  Pain reportedly started this morning.  CRITICAL CARE-  no Performed by: Daleen Bo   Nursing Notes Reviewed/ Care Coordinated Applicable Imaging Reviewed Interpretation of Laboratory Data incorporated into ED treatment  The patient appears reasonably screened and/or stabilized for discharge and I doubt any other medical condition or other [facility] requiring further screening, evaluation, or treatment in the ED at this time prior to discharge.  Plan: Home Medications-stop oxycodone, start hydrocodone, continue other medications; Home Treatments-right advance diet and activity; return here if the recommended treatment, does not improve the symptoms; Recommended follow up-ECP or oncology, as needed.  Continue scheduled treatments.      Daleen Bo, MD 01/29/19 [phone]

## 2019-01-29 NOTE — ED Notes (Signed)
Patient transported to CT 

## 2019-01-29 NOTE — ED Provider Notes (Signed)
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 now presents for left lobe radioembolization. EXAM: 1. ULTRASOUND GUIDANCE FOR VASCULAR ACCESS OF THE RIGHT COMMON FEMORAL ARTERY 2. HEPATIC ARTERIOGRAPHY WITH SELECTIVE CATHETERIZATION OF THE CELIAC AXIS AND ARTERIOGRAPHY AT THE LEVEL OF THE LEFT HEPATIC ARTERY 3. TRANSCATHETER YTTRIUM-90 RADIOEMBOLIZATION OF THE LEFT HEPATIC ARTERY 4. FOLLOW-UP ANGIOGRAPHY AFTER RIGHT HEPATIC RADIOEMBOLIZATION FLUOROSCOPY TIME:  10 minutes and 6 seconds.  340 mGy. MEDICATIONS AND MEDICAL HISTORY: 6.0 mg IV Versed; 100 mcg IV Fentanyl. Additional Medications: 3.375 grams IV Zosyn, 8 mg IV Decadron, 4 mg IV Zofran Y-90 dose: 23 mCi ANESTHESIA/SEDATION: Moderate sedation time: 71 minutes CONTRAST:  75 mL Omnipaque-300 PROCEDURE: The procedure, risks, benefits, and alternatives were explained to the patient. Questions regarding the procedure were encouraged and answered. The patient understands and consents to the procedure. A time-out was performed prior to initiating  the procedure. The right groin was prepped with  chlorhexidine in a sterile fashion, and a sterile drape was applied covering the operative field. A sterile gown and sterile gloves were used for the procedure. Local anesthesia was provided with 1% Lidocaine. Ultrasound was utilized in confirming patency of the right common femoral artery. Access of the right common femoral artery was performed under ultrasound guidance with a micropuncture set. Ultrasound image documentation was performed. A 5-French sheath was placed. A 5-French Cobra catheter was advanced and used to selectively catheterize the celiac axis. Selective arteriography was performed of the celiac axis. A coaxial microcatheter was then advanced into the left hepatic artery and selective arteriography performed. Radioembolization was performed with Yttrium-90 SIR Spheres. Particles were administered via a microcatheter utilizing a completely enclosed system. Monitoring of antegrade flow was performed during administration under fluoroscopy with use of contrast intermittently. After administration of particles, the microcatheter was removed and discarded along with the attached tubing and particle vial. Femoral puncture site was assessed with oblique arteriography. Arteriotomy closure was performed with the Cordis ExoSeal device. 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 The previously embolized accessory left gastric supply off of the accessory left hepatic artery is also occluded after prior coil embolization. Based on arterial supply to the left lobe of the liver, it was elected to perform radioembolization within the distribution the left hepatic artery emanating off of the celiac trunk that supplies most of the left lobe. There is some supply off of the distal aspect of the proper hepatic artery of the left lobe. After advancing a microcatheter beyond the previously embolized branch artery supplying the stomach, selective  arteriography with a microcatheter demonstrates branches supplying both medial and lateral aspects of the left lobe of the liver. Radioembolization was successful in delivering Y-90 particles into the left lobe. Antegrade flow was maintained throughout treatment. Arteriotomy closure was initially successful in establishing hemostasis. The patient will recover for 4 hours after the procedure. COMPLICATIONS: None IMPRESSION: Hepatic arterial radioembolization performed with Yttrium-90 microspheres in the left lobe of the liver. Electronically Signed   By: Aletta Edouard M.D.   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 The patient now presents for left lobe radioembolization. EXAM: 1. ULTRASOUND GUIDANCE FOR VASCULAR ACCESS OF THE RIGHT COMMON FEMORAL ARTERY 2. HEPATIC ARTERIOGRAPHY WITH SELECTIVE CATHETERIZATION OF THE CELIAC AXIS AND ARTERIOGRAPHY AT THE LEVEL OF THE LEFT HEPATIC ARTERY 3. TRANSCATHETER YTTRIUM-90 RADIOEMBOLIZATION OF THE LEFT HEPATIC ARTERY 4. FOLLOW-UP ANGIOGRAPHY AFTER RIGHT HEPATIC RADIOEMBOLIZATION FLUOROSCOPY TIME:  10 minutes and 6 seconds.  340 mGy. MEDICATIONS AND MEDICAL HISTORY: 6.0 mg IV Versed; 100 mcg IV Fentanyl. Additional Medications: 3.375 grams IV Zosyn, 8 mg IV Decadron, 4 mg IV Zofran Y-90 dose: 23 mCi ANESTHESIA/SEDATION: Moderate sedation time: 71 minutes CONTRAST:  75 mL Omnipaque-300 PROCEDURE: The procedure, risks, benefits, and alternatives were explained to the patient. Questions regarding the procedure were encouraged and answered. The patient understands and consents to the procedure. A time-out was performed prior to initiating the procedure. The right groin was prepped with chlorhexidine in a sterile fashion, and a sterile drape was applied covering the  operative field. A sterile gown and sterile gloves were used for the procedure. Local anesthesia was provided with 1% Lidocaine. Ultrasound was utilized  in confirming patency of the right common femoral artery. Access of the right common femoral artery was performed under ultrasound guidance with a micropuncture set. Ultrasound image documentation was performed. A 5-French sheath was placed. A 5-French Cobra catheter was advanced and used to selectively catheterize the celiac axis. Selective arteriography was performed of the celiac axis. A coaxial microcatheter was then advanced into the left hepatic artery and selective arteriography performed. Radioembolization was performed with Yttrium-90 SIR Spheres. Particles were administered via a microcatheter utilizing a completely enclosed system. Monitoring of antegrade flow was performed during administration under fluoroscopy with use of contrast intermittently. After administration of particles, the microcatheter was removed and discarded along with the attached tubing and particle vial. Femoral puncture site was assessed with oblique arteriography. Arteriotomy closure was performed with the Cordis ExoSeal device. 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 The previously embolized accessory left gastric supply off of the accessory left hepatic artery is also occluded after prior coil embolization. Based on arterial supply to the left lobe of the liver, it was elected to perform radioembolization within the distribution the left hepatic artery emanating off of the celiac trunk that supplies most of the left lobe. There is some supply off of the distal aspect of the proper hepatic artery of the left lobe. After advancing a microcatheter beyond the previously embolized branch artery supplying the stomach, selective arteriography with a microcatheter demonstrates branches supplying both medial and  lateral aspects of the left lobe of the liver. Radioembolization was successful in delivering Y-90 particles into the left lobe. Antegrade flow was maintained throughout treatment. Arteriotomy closure was initially successful in establishing hemostasis. The patient will recover for 4 hours after the procedure. COMPLICATIONS: None IMPRESSION: Hepatic arterial radioembolization performed with Yttrium-90 microspheres in the left lobe of the liver. Electronically Signed   By: Aletta Edouard M.D.   On: 01/09/2019 15:21   Nm Radio Pharm Therapy Intraarterial  Result Date: 01/09/2019 CLINICAL DATA:  Hepatocellular carcinoma. Unresectable disease. Prior yttrium 90 radio embolization of the RIGHT hepatic lobe on 09/13/2018. Follow-up treatment to the LEFT hepatic lobe. EXAM: NUCLEAR MEDICINE SPECIAL MED RAD PHYSICS CONS; NUCLEAR MEDICINE RADIO PHARM THERAPY INTRA ARTERIAL; NUCLEAR MEDICINE TREATMENT PROCEDURE; NUCLEAR MEDICINE LIVER SCAN TECHNIQUE: In conjunction with the interventional radiologist a Y- Microsphere dose was calculated utilizing body surface area formulation. Calculated dose equal 23.4 mCi. Pre therapy [facility] liver SPECT scan and CTA were evaluated. Utilizing a microcatheter system, the hepatic artery was selected and Y-90 microspheres were delivered in fractionated aliquots. Radiopharmaceutical was delivered by the interventional radiologist and nuclear radiologist. The patient tolerated procedure well. No adverse effects were noted. Bremsstrahlung planar and SPECT imaging of the abdomen following intrahepatic arterial delivery of Y-90 microsphere was performed. RADIOPHARMACEUTICALS:  22.7 a yttrium 90 microspheres COMPARISON:  [facility] planning scan 08/26/2018, MRI 06/14/2018, CT 02/17/2018 FINDINGS: Y - 90 microspheres therapy as above. First therapy the LEFT hepatic lobe. Prior treatment RIGHT hepatic lobe with 30 millicuries. Bremsstrahlung planar and SPECT imaging of the abdomen following intrahepatic  arterial delivery of Y-79mcrosphere demonstrates radioactivity localized to the LEFT hepatic lobe. No evidence of extrahepatic activity. IMPRESSION: Successful Y - 90 microsphere delivery for treatment of unresectable liver metastasis. First therapy to the LEFT lobe. Prior treatment to the RIGHT hepatic lobe. Bremssstrahlung scan demonstrates activity localized to LEFT hepatic lobewith no extrahepatic activity identified. Electronically Signed   By: SSuzy BouchardM.D.   On: 01/09/2019 14:40  EKG    Radiology No results found.  Procedures Procedures (including critical care time)  Medications Ordered in ED Medications  sodium chloride 0.9 % bolus 1,000 mL (has no administration in time range)  HYDROmorphone (DILAUDID) injection 1 mg (has no administration in time range)  ondansetron (ZOFRAN) injection 4 mg (has no administration in time range)     Initial Impression / Assessment and Plan / ED Course  I have reviewed the triage vital signs and the nursing notes.  Pertinent labs & imaging results that were available during my care of the patient were reviewed by me and considered in my medical decision making (see chart for details).  Iv ns. Dilaudid iv. zofran iv.   Stat labs sent.   ?pain related to prior embolization - however as acute/severe pain and marked tenderness on exam, will get ct imaging.   Reviewed nursing notes and prior charts for additional history.   Labs reviewed by me - wbc normal, hgb normal.  CT pending.  Pain improved post meds.   1515 ct pending. Signed out to Dr Eulis Foster to f/u on ct and recheck pt, dispo appropriately.     Final Clinical Impressions(s) / ED Diagnoses   Final diagnoses:  None    ED Discharge Orders    None       Lajean Saver, MD 01/29/19 1520

## 2019-01-29 NOTE — ED Notes (Signed)
ED Provider at bedside. 

## 2019-01-29 NOTE — ED Notes (Signed)
Unsuccessful IV attempt x2.  

## 2019-02-02 NOTE — Progress Notes (Addendum)
[place]   Telephone:([phone]) [phone] Fax:(336) [phone]   Clinic Follow up Note   Patient Care Team: Lanae Boast, FNP as PCP - General (Family Medicine)  Date of Service:  02/09/2019  CHIEF COMPLAINT:  F/u of [place]  SUMMARY OF ONCOLOGIC HISTORY: Oncology History  Hepatocellular carcinoma ([place])  12/07/2017 Initial Diagnosis   Hepatocellular carcinoma ([place])   12/07/2017 Imaging   US Abdomen 12/07/17 IMPRESSION: ULTRASOUND ABDOMEN: Probable 4 mm gallstone in gallbladder.  Multiple hepatic masses largest heterogeneous RIGHT lobe measuring 6.1 x 5.2 x 5.5 cm; metastatic disease and primary tumor not excluded, recommend further evaluation by [facility] imaging.  ULTRASOUND HEPATIC ELASTOGRAPHY:  Median hepatic shear wave velocity is calculated at 1.52 m/sec.  Corresponding Metavir fibrosis score is F2 + some F3.  Risk of fibrosis is moderate.  Follow-up: Additional testing appropriate   12/22/2017 PET scan   PET 12/22/17  IMPRESSION: 1. Lesions within the liver have low metabolic activity for size. 2. Dominant lesion in liver does have a focus of metabolic activity which corresponds to a low-density rounded region within the larger lesion. Target this 2 cm focus within the dominant lesion for biopsy. The liver lesions remain concerning for metastatic disease or primary liver carcinoma. Mucinous or cystic carcinomas can have low metabolic activity. 3. Focus of metabolic activity at the level of the hepatic flexure of the colon with some soft tissue thickening not clearly localized. Some additional foci of uptake within colon. This could represent physiologic activity however depending on the biopsy results of the liver recommend colonoscopy.   12/23/2017 Cancer Staging   Staging form: Liver, AJCC 8th Edition - Clinical stage from 12/23/2017: Stage IIIA (cT3, cN0, cM0) - Signed by Truitt Merle, MD on 12/30/2017   12/23/2017 Initial Biopsy   Diagnosis 12/23/17 Liver,  needle/core biopsy, Right lobe - HEPATOCELLULAR CARCINOMA. Microscopic Comment Dr. Lyndon Code has reviewed the case. Dr. Burr Medico was paged on 12/24/2017.     02/18/2018 Imaging   CT AP W WO Zuniga 02/18/18  IMPRESSION: 1. Multifocal hepatocellular carcinoma, mildly progressive from baseline MRI 12/10/2017. 2. No evidence of extrahepatic nodal metastases or vascular involvement. 3. Borderline hepatic steatosis. No morphologic changes of underlying cirrhosis or portal hypertension. 4. Cholelithiasis. 5. Mild Aortic Atherosclerosis (ICD10-I70.0).   03/24/2018 Procedure   TACE Chemo ebolization on 03/24/18 with Dr. Kathlene Cote    06/14/2018 Imaging   MRI abdomen 06/14/18  IMPRESSION: 1. Dominant lesion in the central LEFT hepatic lobe (segment 4A) is stable in size, decreased in central enhancement, and with persistent nodular wall enhancement. 2. Multiple additional smaller lesions within LEFT and RIGHT hepatic lobe are increased in size. 3. Potential new lesions on the early arterial phase imaging.   08/16/2018 Imaging   CT Chest 08/16/18   IMPRESSION: Multifocal hepatic masses in this patient with known liver cancer, similar to prior MRI, although poorly evaluated.  No evidence of metastatic disease in the chest.  Aortic Atherosclerosis (ICD10-I70.0) and Emphysema (ICD10-J43.9).   09/13/2018 Procedure   Y90 treatment with Dr. Kathlene Cote on 09/13/18 and 01/09/19   01/29/2019 Imaging    CT AP WO Zuniga 01/29/19 IMPRESSION: 1. No acute findings within the abdomen or pelvis. 2. Mild decrease in multiple liver masses compared to previous MRI.   02/09/2019 Imaging   MRI Abdomen  IMPRESSION: 1. Today's study demonstrates a mixed response to therapy. Overall, most lesions are decreased in size compared to the prior examination, with the exception of 2 new lesions in the liver, as detailed above.  Findings remain most compatible with multifocal hepatocellular carcinoma. 2. Hepatic  steatosis and hepatic cirrhosis.    Chemotherapy   atezolizumab (Tecentriq) and bevacizumab (Avastin) q3weeks starting with cycle 1 Tecentriq only next week, may add Avastin with cycle 2.       CURRENT THERAPY:  atezolizumab (Tecentriq) and bevacizumab (Avastin) q3weeks starting with cycle 1 Tecentriq only next week, may add Avastin with cycle 2.   INTERVAL HISTORY:  [name] is here for a follow up of [place]. He presents to the clinic alone. He called his significant other to be included in the visit. He notes he has not been sleeping well. He notes his RUQ pain is about at a 7. He went to ER last weekend because of pain and nausea/vomiting. He has been taking antiemetics which does help. He takes Hydrocodone to get sleep and help his pain but only sleeps 2 hours. He will take it about 2-3 times a day. He does not take tramadol because it does not help him.  His girlfriend notes he has been getting SOB with activity. She wonders if he is developing an allergy and may need an inhaler. He denies having history of COPD.     REVIEW OF SYSTEMS:   Constitutional: Denies fevers, chills or abnormal weight loss (+) trouble sleeping  Eyes: Denies blurriness of vision Ears, nose, mouth, throat, and face: Denies mucositis or sore throat Respiratory: Denies cough, dyspnea or wheezes Cardiovascular: Denies palpitation, chest discomfort or lower extremity swelling Gastrointestinal:  Denies heartburn or change in bowel habits (+) RUQ pain (+) nausea Skin: Denies abnormal skin rashes Lymphatics: Denies new lymphadenopathy or easy bruising Neurological:Denies numbness, tingling or new weaknesses Behavioral/Psych: Mood is stable, no new changes  All other systems were reviewed with the patient and are negative.  MEDICAL HISTORY:  Past Medical History:  Diagnosis Date   Cancer ([place]) 2019   liver cancer   Chest pain    pt states when he turned over on other side aided in relief   Diabetes  mellitus    Dyspnea    increased activity   Elevated cholesterol    Hepatitis C    Hypertension     SURGICAL HISTORY: Past Surgical History:  Procedure Laterality Date   I&D EXTREMITY  06/28/2011   Procedure: IRRIGATION AND DEBRIDEMENT EXTREMITY;  Surgeon: Linna Hoff;  Location: [facility];  Service: Orthopedics;  Laterality: Left;  with application of wound vac   INCISION AND DRAINAGE OF WOUND  07/02/2011   Procedure: IRRIGATION AND DEBRIDEMENT WOUND;  Surgeon: Linna Hoff;  Location: [facility];  Service: Orthopedics;  Laterality: Left;  Irrigation and Debridement of left arm wound with  Skin Grafting from left thigh    IR ANGIOGRAM SELECTIVE EACH ADDITIONAL VESSEL  03/18/2018   IR ANGIOGRAM SELECTIVE EACH ADDITIONAL VESSEL  08/26/2018   IR ANGIOGRAM SELECTIVE EACH ADDITIONAL VESSEL  08/26/2018   IR ANGIOGRAM SELECTIVE EACH ADDITIONAL VESSEL  08/26/2018   IR ANGIOGRAM SELECTIVE EACH ADDITIONAL VESSEL  08/26/2018   IR ANGIOGRAM SELECTIVE EACH ADDITIONAL VESSEL  09/13/2018   IR ANGIOGRAM SELECTIVE EACH ADDITIONAL VESSEL  01/09/2019   IR ANGIOGRAM VISCERAL SELECTIVE  03/18/2018   IR ANGIOGRAM VISCERAL SELECTIVE  03/18/2018   IR ANGIOGRAM VISCERAL SELECTIVE  08/26/2018   IR ANGIOGRAM VISCERAL SELECTIVE  08/26/2018   IR ANGIOGRAM VISCERAL SELECTIVE  09/13/2018   IR ANGIOGRAM VISCERAL SELECTIVE  01/09/2019   IR EMBO ARTERIAL NOT HEMORR HEMANG INC GUIDE ROADMAPPING  08/26/2018  IR EMBO TUMOR ORGAN ISCHEMIA INFARCT INC GUIDE ROADMAPPING  03/18/2018   IR EMBO TUMOR ORGAN ISCHEMIA INFARCT INC GUIDE ROADMAPPING  09/13/2018   IR EMBO TUMOR ORGAN ISCHEMIA INFARCT INC GUIDE ROADMAPPING  01/09/2019   IR RADIOLOGIST EVAL & MGMT  02/16/2018   IR RADIOLOGIST EVAL & MGMT  07/12/2018   IR US GUIDE VASC ACCESS RIGHT  03/18/2018   IR US GUIDE VASC ACCESS RIGHT  08/26/2018   IR US GUIDE VASC ACCESS RIGHT  09/13/2018   IR US GUIDE VASC ACCESS RIGHT  01/09/2019   JOINT REPLACEMENT     PARTIAL HIP  ARTHROPLASTY      I have reviewed the social history and family history with the patient and they are unchanged from previous note.  ALLERGIES:  is allergic to gadolinium derivatives and multihance [gadobenate].  MEDICATIONS:  Current Outpatient Medications  Medication Sig Dispense Refill   cyclobenzaprine (FLEXERIL) 10 MG tablet Take 1/2 to 1 whole tablet by mouth every 8 hours as needed for muscle spasms. 15 tablet 0   diclofenac (VOLTAREN) 75 MG EC tablet Take 1 tablet (75 mg total) by mouth 2 (two) times daily as needed for moderate pain. 30 tablet 0   gabapentin (NEURONTIN) 300 MG capsule Take 1 capsule (300 mg total) by mouth 3 (three) times daily. 270 capsule 1   glipiZIDE (GLUCOTROL) 10 MG tablet TAKE 1 TABLET(10 MG) BY MOUTH TWICE DAILY BEFORE A MEAL 180 tablet 2   glucose blood test strip Check blood sugar 3 times per day prior to meals. Use as instructed 100 each 12   hydrochlorothiazide (HYDRODIURIL) 25 MG tablet TAKE 1 TABLET(25 MG) BY MOUTH DAILY (Patient taking differently: Take 25 mg by mouth daily. ) 90 tablet 1   HYDROcodone-acetaminophen (NORCO) 5-325 MG tablet Take 1 tablet by mouth every 8 (eight) hours as needed for moderate pain. 40 tablet 0   insulin glargine (LANTUS) 100 UNIT/ML injection Inject 0.3 mLs (30 Units total) into the skin at bedtime. 10 mL 11   Insulin Pen Needle (PEN NEEDLES) 32G X 4 MM MISC 1 packet by Does not apply route 3 (three) times daily as needed. 100 each 2   lisinopril (PRINIVIL,ZESTRIL) 20 MG tablet Take 1 tablet (20 mg total) by mouth daily. 90 tablet 1   metFORMIN (GLUCOPHAGE) 1000 MG tablet TAKE 1 TABLET(1000 MG) BY MOUTH TWICE DAILY 180 tablet 0   ondansetron (ZOFRAN) 4 MG tablet Take 1 tablet (4 mg total) by mouth every 8 (eight) hours as needed for nausea or vomiting. 30 tablet 0   pravastatin (PRAVACHOL) 80 MG tablet Take 1 tablet (80 mg total) by mouth daily. 90 tablet 1   predniSONE (DELTASONE) 50 MG tablet Take  Prednisone 50 mg  13 hr,  7 hr,  And  1 hr before MRI scan  As directed. 3 tablet 0   prochlorperazine (COMPAZINE) 10 MG tablet Take 1 tablet (10 mg total) by mouth every 6 (six) hours as needed for nausea or vomiting. 30 tablet 1   senna (SENOKOT) 8.6 MG tablet Take 1 tablet by mouth as needed for constipation.     Sofosbuvir-Velpatasvir (EPCLUSA) 400-100 MG TABS Take 1 tablet by mouth daily. 28 tablet 2   Syringe, Disposable, (B-D 30CC SYRINGE) 30 ML MISC 1 Syringe by Does not apply route as directed. 50 each 5   traMADol (ULTRAM) 50 MG tablet TAKE 1 TABLET(50 MG) BY MOUTH EVERY 8 HOURS AS NEEDED (Patient taking differently: Take 50 mg by mouth every  8 (eight) hours as needed for moderate pain. ) 30 tablet 0   No current facility-administered medications for this visit.     PHYSICAL EXAMINATION: ECOG PERFORMANCE STATUS: 2 - Symptomatic, <50% confined to bed  Vitals:   02/09/19 0832  BP: (!) 143/89  Pulse: 99  Resp: 18  Temp: (!) 97.5 F (36.4 C)  SpO2: 100%   Filed Weights   02/09/19 0832  Weight: 163 lb 9.6 oz (74.2 kg)    GENERAL:alert, no distress and comfortable SKIN: skin color, texture, turgor are normal, no rashes or significant lesions EYES: normal, Conjunctiva are pink and non-injected, sclera clear  NECK: supple, thyroid normal size, non-tender, without nodularity LYMPH:  no palpable lymphadenopathy in the cervical, axillary  LUNGS: clear to auscultation and percussion with normal breathing effort HEART: regular rate & rhythm and no murmurs and no lower extremity edema ABDOMEN:abdomen soft, (+) (+) hepatomegaly, palpable 3cm below ribcage, moderately tender Musculoskeletal:no cyanosis of digits and no clubbing  NEURO: alert & oriented x 3 with fluent speech, no focal motor/sensory deficits  LABORATORY DATA:  I have reviewed the data as listed CBC Latest Ref Rng & Units 01/29/2019 01/20/2019 01/09/2019  WBC 4.0 - 10.5 K/uL 4.2 3.7(L) 3.2(L)  Hemoglobin 13.0 -  17.0 g/dL 15.0 14.5 14.3  Hematocrit 39.0 - 52.0 % 46.2 43.8 43.4  Platelets 150 - 400 K/uL 261 247 228     CMP Latest Ref Rng & Units 01/29/2019 01/20/2019 01/09/2019  Glucose 70 - 99 mg/dL 231(H) 165(H) 196(H)  BUN 6 - 20 mg/dL 14 6 15   Creatinine 0.61 - 1.24 mg/dL 1.00 1.01 0.94  Sodium 135 - 145 mmol/L 135 136 137  Potassium 3.5 - 5.1 mmol/L 4.6 4.6 3.9  Chloride 98 - 111 mmol/L 96(L) 98 103  CO2 22 - 32 mmol/L 25 25 24   Calcium 8.9 - 10.3 mg/dL 10.1 9.9 9.2  Total Protein 6.5 - 8.1 g/dL 8.3(H) 7.5 7.2  Total Bilirubin 0.3 - 1.2 mg/dL 1.9(H) 1.0 0.5  Alkaline Phos 38 - 126 U/L 87 93 68  AST 15 - 41 U/L 83(H) 109(H) 85(H)  ALT 0 - 44 U/L 76(H) 85(H) 96(H)      RADIOGRAPHIC STUDIES: I have personally reviewed the radiological images as listed and agreed with the findings in the report. [name]  Result Date: 02/09/2019 CLINICAL DATA:  53 year old male with history of hepatocellular carcinoma status post locoregional or systemic therapy. Evaluate for treatment response. EXAM: MRI ABDOMEN WITHOUT AND WITH Zuniga TECHNIQUE: Multiplanar multisequence [facility] imaging of the abdomen was performed both before and after the administration of intravenous Zuniga. Zuniga:  7.5 mL of Gadavist. COMPARISON:  MRI of the abdomen 06/14/2018. Noncontrast CT the abdomen and pelvis 01/21/2019. FINDINGS: Lower chest: Unremarkable. Hepatobiliary: Mild diffuse loss of signal intensity throughout the hepatic parenchyma on out of phase dual echo images, indicative of a background of hepatic steatosis. Liver has a slightly shrunken appearance and nodular contour, indicative of a background of cirrhosis. Multiple hepatic lesions are again noted, most of which are T1 hypointense, slightly T2 hyperintense, and demonstrate various degrees of enhancement but are generally centrally hypovascular with predominantly peripheral enhancement. The largest of these in segment 4A/8 is decreased in size compared to  the prior examination, currently measuring 4.1 x 3.6 cm (previously 4.2 x 4.7 cm on 06/14/2018). The majority of the smaller lesions also appear generally decreased in size compared to the prior examination. However, there are some new lesions. This includes  a 2.2 x 1.8 cm lesion in segment 8 of the liver just medial to the largest lesion (axial image 36 of series 10/2) which is not confidently identified on the prior examination. There is also a 2.5 x 1.9 cm lesion in segment 3 (axial image 50 of series 10/2) which was also not confidently identified on the prior study. No intra or extrahepatic biliary ductal dilatation. Gallbladder is normal in appearance. Pancreas: No pancreatic mass. No pancreatic ductal dilatation. No pancreatic or peripancreatic fluid collections or inflammatory changes. Spleen:  Unremarkable. Adrenals/Urinary Tract: Bilateral kidneys and adrenal glands are normal in appearance. No hydroureteronephrosis in the visualized portions of the abdomen. Stomach/Bowel: Visualized portions are unremarkable. Vascular/Lymphatic: No aneurysm identified in the visualized abdominal vasculature. Portal vein remains patent. No lymphadenopathy noted in the abdomen. Other: No significant volume of ascites noted in the visualized portions of the peritoneal cavity. Musculoskeletal: No aggressive appearing osseous lesions are noted in the visualized portions of the skeleton. IMPRESSION: 1. Today's study demonstrates a mixed response to therapy. Overall, most lesions are decreased in size compared to the prior examination, with the exception of 2 new lesions in the liver, as detailed above. Findings remain most compatible with multifocal hepatocellular carcinoma. 2. Hepatic steatosis and hepatic cirrhosis. Electronically Signed   By: Vinnie Langton M.D.   On: 02/09/2019 08:26     ASSESSMENT & PLAN:  JAYMAR LOEBER is a 53 y.o. male with   1.Hepatocellular carcinoma,stage IIIA (cT3N0M0) -He was diagnosed in  12/2017. He is s/p TACE Chemo embolization on 03/24/18 with Dr. Kathlene Cote.  -His repeat MRI in 06/14/18 showed disease progression in liver, except the treated lesion in 4A.No extrahepatic disease on the recent abdominal MRI. We discussed that his liver cancer is not curable at this stage, andgoal of therapy is to control disease andprolong his life. -He underwent Y90 treatments on 09/13/18 and 01/09/19.  -After latest Y90, he has worsening RUQ abdominal pain. He has hepatomegaly with significant tenderness on prior exam.  --CT AP from 01/29/19 and MRI from yesterday showed his previous treated liver lesions have much improved but now has 2 new liver lesions.  -due to his disease progression, I recommend him to start systemic therapy.  We discussed the option of petechiae, immunotherapy, and combination of immunotherapy and VGFR inhibitor.  Due to his large tumor burden, and symptomatic disease,I recommend atezolizumab (Tecentriq) and bevacizumab (Avastin) q3weeks, which has showed good response rate in the recent [place] trial.  I reviewed side effects in great details with him, which includes but not limited to, fatigue, thyroid dysfunction, autoimmune related disorder, endocrine dysfunction, risk of bleeding, thrombosis, bowel perforation, wound healing issue, proteinuria, renal and liver dysfunction, etc. he voiced good understanding.  He was reluctant to start treatment, his wife strongly encouraged him to consider, after lengthy discussion he agrees to proceed.   -The goal of therapy is disease control, to prolong his life and improve his quality of life.  Unfortunately his cancer is not curable at this stage. -I recommend EGD to rule out varices from his liver cirrhosis, due to the risk of thrombosis from Avastin.  I will hold Avastin for the first cycle. -Would scan him in 3-4 months to monitor his response.  -Will proceed with chemo education class before start of treatment.    2. Abdominal pain,  nausea and constipation -Constipation currently resolved. He will watch for constipation on pain medication. -Most relief with Zofran for nausea. Will continue as needed.  -He had  RUQ abdominal pain worsen 3-6 weeks after first Y90. After his second treatment his pain worsened further, now 8/10 -Tramadol is no longer controlling his pain.  -He is currently on hydrocodone, will increase to q8hours to better control pain. He can use Tylenol or ibuprofen for mild pain. I refilled today (02/09/19) -I discussed being cautious and only use as needed for severe pain, as this can lead to dependence. He understands.  -Latest CT and MRI showed his worsening pain is likely related to his new liver lesions.    3. Hepatitis C, untreated  -He was diagnosed with Hepatitis Cin 09/2017, however he is unsure of how long he has had hepatitis C prior to his diagnosis.  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 -Dr. Linus Salmons does not recommend treatment until Cancer is controlled. -I encouraged the patient to maintain follow up with Dr. Linus Salmons. -No significant liver cirrhosis on MRI. -Will refer to [facility] for upper endoscopy.   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 -I advised the patient to maintain follow up with his PCP -BP elevated today, likely related to his uncontrolled pain  5. Alcohol and smoking cessation -I advised and recommended with the patient that he does discontinue use of ETOH due to the patient already with multiple liver issues.  -He does smoke cigarettes and he has a 40 year history.I repeatedly advised that the patient discontinue use of smoking cigarettes at this time due to the increase risk of lung cancer. -He has reduced smoking to 1 cigarettes a day. He has mild SOB and cough. I again encouraged him to completely quit.  -He reduced drinking to beer  occasionally. I again recommended complete cessation. He understands.   6. Goal of care discussion  -We again discussed the incurable nature of his cancer, and the overall poor prognosis, especially if he does not have good response to therapy or progress on therapy -The patient understands the goal of care is palliative. -he is full code    Plan -I will refer him to [facility] for Endoscopy in next 2-3 weeks.  -I refilled Hydrocodone today  -Lab, f/u, Tecentriq next week  -chemo class -phone visit after first treatment, I will see him before second cycle treatment     No problem-specific Assessment & Plan notes found for this encounter.   Orders Placed This Encounter  Procedures   Ambulatory referral to Gastroenterology    Referral Priority:   Routine    Referral Type:   Consultation    Referral Reason:   Specialty Services Required    Number of Visits Requested:   1   All questions were answered. The patient knows to call the clinic with any problems, questions or concerns. No barriers to learning was detected. I spent 30 minutes counseling the patient face to face. The total time spent in the appointment was 40 minutes and more than 50% was on counseling and review of test results     Truitt Merle, MD 02/09/2019   I, Joslyn Devon, am acting as scribe for Truitt Merle, MD.   I have reviewed the above documentation for accuracy and completeness, and I agree with the above.

## 2019-02-06 ENCOUNTER — Ambulatory Visit (HOSPITAL_COMMUNITY): Payer: Medicare Other

## 2019-02-08 ENCOUNTER — Ambulatory Visit (HOSPITAL_COMMUNITY)
Admission: RE | Admit: 2019-02-08 | Discharge: 2019-02-08 | Disposition: A | Discharge status: Home / Self Care | Payer: Medicare Other | Source: Ambulatory Visit | Attending: Hematology | Admitting: Hematology

## 2019-02-08 ENCOUNTER — Other Ambulatory Visit: Payer: Self-pay

## 2019-02-08 DIAGNOSIS — C220 Liver cell carcinoma: Secondary | ICD-10-CM | POA: Diagnosis not present

## 2019-02-08 DIAGNOSIS — K7460 Unspecified cirrhosis of liver: Secondary | ICD-10-CM | POA: Diagnosis not present

## 2019-02-08 DIAGNOSIS — K760 Fatty (change of) liver, not elsewhere classified: Secondary | ICD-10-CM | POA: Diagnosis not present

## 2019-02-08 MED ORDER — GADOBUTROL 1 MMOL/ML IV SOLN
7.5000 mL | Freq: Once | INTRAVENOUS | Status: AC | PRN
  Administered 2019-02-08: 7.5 mL via INTRAVENOUS

## 2019-02-08 NOTE — Progress Notes (Signed)
Patient with history of allergy to gadolinium for which is usually takes pre-medication for.  MRI tech requests prescription for patient due to upcoming MRI.  Written prescription for 13 hr prep given.   Brynda Greathouse, MS RD PA-C

## 2019-02-09 ENCOUNTER — Other Ambulatory Visit: Payer: Self-pay | Admitting: Interventional Radiology

## 2019-02-09 ENCOUNTER — Inpatient Hospital Stay: Payer: Medicare Other | Attending: Hematology | Admitting: Hematology

## 2019-02-09 ENCOUNTER — Other Ambulatory Visit: Payer: Self-pay

## 2019-02-09 ENCOUNTER — Telehealth: Payer: Medicare Other

## 2019-02-09 ENCOUNTER — Encounter: Payer: Self-pay | Admitting: Hematology

## 2019-02-09 ENCOUNTER — Telehealth: Payer: Self-pay | Admitting: Hematology

## 2019-02-09 VITALS — BP 143/89 | HR 99 | Temp 97.5°F | Resp 18 | Ht 67.0 in | Wt 163.6 lb

## 2019-02-09 DIAGNOSIS — C220 Liver cell carcinoma: Secondary | ICD-10-CM

## 2019-02-09 DIAGNOSIS — I10 Essential (primary) hypertension: Secondary | ICD-10-CM | POA: Diagnosis not present

## 2019-02-09 DIAGNOSIS — E119 Type 2 diabetes mellitus without complications: Secondary | ICD-10-CM | POA: Diagnosis not present

## 2019-02-09 DIAGNOSIS — Z5112 Encounter for antineoplastic immunotherapy: Secondary | ICD-10-CM | POA: Diagnosis not present

## 2019-02-09 DIAGNOSIS — Z7189 Other specified counseling: Secondary | ICD-10-CM | POA: Insufficient documentation

## 2019-02-09 DIAGNOSIS — Z79899 Other long term (current) drug therapy: Secondary | ICD-10-CM | POA: Diagnosis not present

## 2019-02-09 DIAGNOSIS — Z794 Long term (current) use of insulin: Secondary | ICD-10-CM | POA: Insufficient documentation

## 2019-02-09 DIAGNOSIS — Z791 Long term (current) use of non-steroidal anti-inflammatories (NSAID): Secondary | ICD-10-CM | POA: Insufficient documentation

## 2019-02-09 DIAGNOSIS — E7800 Pure hypercholesterolemia, unspecified: Secondary | ICD-10-CM | POA: Insufficient documentation

## 2019-02-09 DIAGNOSIS — F17210 Nicotine dependence, cigarettes, uncomplicated: Secondary | ICD-10-CM | POA: Insufficient documentation

## 2019-02-09 MED ORDER — HYDROCODONE-ACETAMINOPHEN 5-325 MG PO TABS: 1.0000 | ORAL_TABLET | Freq: Three times a day (TID) | ORAL | 0 refills | Status: DC | PRN

## 2019-02-09 NOTE — Progress Notes (Signed)
START OFF [facility] - Other   [id number]:Atezolizumab 1,200 mg IV D1 + Bevacizumab 15 mg/kg IV D1 q21 Days:   A cycle is every 21 Days:     Atezolizumab      Bevacizumab-xxxx   **Always confirm dose/schedule in your pharmacy ordering system**  Patient Characteristics: Intent of Therapy: Non-Curative / Palliative Intent, Discussed with Patient

## 2019-02-09 NOTE — Telephone Encounter (Signed)
Scheduled appt per 7/9 los. Spoke with patient and he is aware of his appt date and time.

## 2019-02-10 ENCOUNTER — Encounter: Payer: Self-pay | Admitting: Gastroenterology

## 2019-02-10 ENCOUNTER — Other Ambulatory Visit (HOSPITAL_COMMUNITY): Payer: Medicare Other

## 2019-02-13 ENCOUNTER — Telehealth: Payer: Self-pay | Admitting: Hematology and Oncology

## 2019-02-14 ENCOUNTER — Telehealth: Payer: Self-pay | Admitting: *Deleted

## 2019-02-14 ENCOUNTER — Inpatient Hospital Stay: Payer: Medicare Other

## 2019-02-15 ENCOUNTER — Inpatient Hospital Stay: Payer: Medicare Other

## 2019-02-15 ENCOUNTER — Encounter: Payer: Self-pay | Admitting: Hematology

## 2019-02-15 ENCOUNTER — Other Ambulatory Visit: Payer: Self-pay

## 2019-02-15 VITALS — BP 108/73 | HR 94 | Temp 98.3°F | Resp 18

## 2019-02-15 DIAGNOSIS — C220 Liver cell carcinoma: Secondary | ICD-10-CM

## 2019-02-15 DIAGNOSIS — Z794 Long term (current) use of insulin: Secondary | ICD-10-CM | POA: Diagnosis not present

## 2019-02-15 DIAGNOSIS — Z79899 Other long term (current) drug therapy: Secondary | ICD-10-CM | POA: Diagnosis not present

## 2019-02-15 DIAGNOSIS — E7800 Pure hypercholesterolemia, unspecified: Secondary | ICD-10-CM | POA: Diagnosis not present

## 2019-02-15 DIAGNOSIS — Z791 Long term (current) use of non-steroidal anti-inflammatories (NSAID): Secondary | ICD-10-CM | POA: Diagnosis not present

## 2019-02-15 DIAGNOSIS — Z7189 Other specified counseling: Secondary | ICD-10-CM

## 2019-02-15 DIAGNOSIS — Z5112 Encounter for antineoplastic immunotherapy: Secondary | ICD-10-CM | POA: Diagnosis not present

## 2019-02-15 DIAGNOSIS — E119 Type 2 diabetes mellitus without complications: Secondary | ICD-10-CM | POA: Diagnosis not present

## 2019-02-15 DIAGNOSIS — I10 Essential (primary) hypertension: Secondary | ICD-10-CM | POA: Diagnosis not present

## 2019-02-15 LAB — CBC WITH DIFFERENTIAL (CANCER CENTER ONLY)
Abs Immature Granulocytes: 0.02 10*3/uL (ref 0.00–0.07)
Basophils Absolute: 0 10*3/uL (ref 0.0–0.1)
Basophils Relative: 0 %
Eosinophils Absolute: 0 10*3/uL (ref 0.0–0.5)
Eosinophils Relative: 1 %
HCT: 41.8 % (ref 39.0–52.0)
Hemoglobin: 14.1 g/dL (ref 13.0–17.0)
Immature Granulocytes: 1 %
Lymphocytes Relative: 7 %
Lymphs Abs: 0.3 10*3/uL — ABNORMAL LOW (ref 0.7–4.0)
MCH: 29.7 pg (ref 26.0–34.0)
MCHC: 33.7 g/dL (ref 30.0–36.0)
MCV: 88 fL (ref 80.0–100.0)
Monocytes Absolute: 0.5 10*3/uL (ref 0.1–1.0)
Monocytes Relative: 14 %
Neutro Abs: 2.9 10*3/uL (ref 1.7–7.7)
Neutrophils Relative %: 77 %
Platelet Count: 237 10*3/uL (ref 150–400)
RBC: 4.75 MIL/uL (ref 4.22–5.81)
RDW: 12.8 % (ref 11.5–15.5)
WBC Count: 3.8 10*3/uL — ABNORMAL LOW (ref 4.0–10.5)
nRBC: 0 % (ref 0.0–0.2)

## 2019-02-15 LAB — CMP (CANCER CENTER ONLY)
ALT: 97 U/L — ABNORMAL HIGH (ref 0–44)
AST: 129 U/L — ABNORMAL HIGH (ref 15–41)
Albumin: 3.3 g/dL — ABNORMAL LOW (ref 3.5–5.0)
Alkaline Phosphatase: 101 U/L (ref 38–126)
Anion gap: 12 (ref 5–15)
BUN: 9 mg/dL (ref 6–20)
CO2: 25 mmol/L (ref 22–32)
Calcium: 9.3 mg/dL (ref 8.9–10.3)
Chloride: 99 mmol/L (ref 98–111)
Creatinine: 0.98 mg/dL (ref 0.61–1.24)
GFR, Est AFR Am: >60 mL/min (ref >60)
GFR, Estimated: >60 mL/min (ref >60)
Glucose, Bld: 157 mg/dL — ABNORMAL HIGH (ref 70–99)
Potassium: 4.1 mmol/L (ref 3.5–5.1)
Sodium: 136 mmol/L (ref 135–145)
Total Bilirubin: 1 mg/dL (ref 0.3–1.2)
Total Protein: 7.1 g/dL (ref 6.5–8.1)

## 2019-02-15 LAB — TSH: TSH: 1.309 u[IU]/mL (ref 0.320–4.118)

## 2019-02-15 MED ORDER — SODIUM CHLORIDE 0.9 % IV SOLN
1200.0000 mg | Freq: Once | INTRAVENOUS | Status: AC
  Administered 2019-02-15: 16:00:00 1200 mg via INTRAVENOUS
  Filled 2019-02-15: qty 20

## 2019-02-15 MED ORDER — SODIUM CHLORIDE 0.9 % IV SOLN
Freq: Once | INTRAVENOUS | Status: AC
  Administered 2019-02-15: 15:00:00 via INTRAVENOUS
  Filled 2019-02-15: qty 250

## 2019-02-15 NOTE — Progress Notes (Signed)
Met with patient in lobby to introduce myself as Arboriculturist and to offer available resources.  Discussed one-time $49 Engineer, drilling to assist with personal expenses while going through treatment.  Gave him my card if interested in applying and for any additional financial questions or concerns. He verbalized understanding.

## 2019-02-15 NOTE — Patient Instructions (Signed)
[facility] Discharge Instructions for Patients Receiving Chemotherapy  Today you received the following chemotherapy agent: Tecentriq  To help prevent nausea and vomiting after your treatment, we encourage you to take your nausea medication as prescribed.  If you develop nausea and vomiting that is not controlled by your nausea medication, call the clinic.   BELOW ARE SYMPTOMS THAT SHOULD BE REPORTED IMMEDIATELY:  *FEVER GREATER THAN 100.5 F  *CHILLS WITH OR WITHOUT FEVER  NAUSEA AND VOMITING THAT IS NOT CONTROLLED WITH YOUR NAUSEA MEDICATION  *UNUSUAL SHORTNESS OF BREATH  *UNUSUAL BRUISING OR BLEEDING  TENDERNESS IN MOUTH AND THROAT WITH OR WITHOUT PRESENCE OF ULCERS  *URINARY PROBLEMS  *BOWEL PROBLEMS  UNUSUAL RASH Items with * indicate a potential emergency and should be followed up as soon as possible.  Feel free to call the clinic should you have any questions or concerns. The clinic phone number is [phone].  Please show the [facility] at check-in to the Emergency Department and triage nurse.    Atezolizumab injection What is this medicine? ATEZOLIZUMAB (a te zoe LIZ ue mab) is a monoclonal antibody. It is used to treat bladder cancer (urothelial cancer), non-small cell lung cancer, small cell lung cancer, and breast cancer. This medicine may be used for other purposes; ask your health care provider or pharmacist if you have questions. COMMON BRAND NAME(S): Tecentriq What should I tell my health care provider before I take this medicine? They need to know if you have any of these conditions:  diabetes  immune system problems  infection  inflammatory bowel disease  liver disease  lung or breathing disease  lupus  nervous system problems like myasthenia gravis or Guillain-Barre syndrome  organ transplant  an unusual or allergic reaction to atezolizumab, other medicines, foods, dyes, or preservatives  pregnant or trying to  get pregnant  breast-feeding How should I use this medicine? This medicine is for infusion into a vein. It is given by a health care professional in a hospital or clinic setting. A special MedGuide will be given to you before each treatment. Be sure to read this information carefully each time. Talk to your pediatrician regarding the use of this medicine in children. Special care may be needed. Overdosage: If you think you have taken too much of this medicine contact a poison control center or emergency room at once. NOTE: This medicine is only for you. Do not share this medicine with others. What if I miss a dose? It is important not to miss your dose. Call your doctor or health care professional if you are unable to keep an appointment. What may interact with this medicine? Interactions have not been studied. This list may not describe all possible interactions. Give your health care provider a list of all the medicines, herbs, non-prescription drugs, or dietary supplements you use. Also tell them if you smoke, drink alcohol, or use illegal drugs. Some items may interact with your medicine. What should I watch for while using this medicine? Your condition will be monitored carefully while you are receiving this medicine. You may need blood work done while you are taking this medicine. Do not become pregnant while taking this medicine or for at least 5 months after stopping it. Women should inform their doctor if they wish to become pregnant or think they might be pregnant. There is a potential for serious side effects to an unborn child. Talk to your health care professional or pharmacist for more information. Do not  breast-feed an infant while taking this medicine or for at least 5 months after the last dose. What side effects may I notice from receiving this medicine? Side effects that you should report to your doctor or health care professional as soon as possible:  allergic reactions like  skin rash, itching or hives, swelling of the face, lips, or tongue  black, tarry stools  bloody or watery diarrhea  breathing problems  changes in vision  chest pain or chest tightness  chills  facial flushing  fever  headache  signs and symptoms of high blood sugar such as dizziness; dry mouth; dry skin; fruity breath; nausea; stomach pain; increased hunger or thirst; increased urination  signs and symptoms of liver injury like dark yellow or brown urine; general ill feeling or flu-like symptoms; light-colored stools; loss of appetite; nausea; right upper belly pain; unusually weak or tired; yellowing of the eyes or skin  stomach pain  trouble passing urine or change in the amount of urine Side effects that usually do not require medical attention (report to your doctor or health care professional if they continue or are bothersome):  cough  diarrhea  joint pain  muscle pain  muscle weakness  tiredness  weight loss This list may not describe all possible side effects. Call your doctor for medical advice about side effects. You may report side effects to FDA at 1-800-FDA-1088. Where should I keep my medicine? This drug is given in a hospital or clinic and will not be stored at home. NOTE: This sheet is a summary. It may not cover all possible information. If you have questions about this medicine, talk to your doctor, pharmacist, or health care provider.  2020 Elsevier/Gold Standard (2017-10-22 09:33:38)

## 2019-02-15 NOTE — Progress Notes (Signed)
Ok to treat with elevated AST/ALT per MD Burr Medico.  Pt verbalized understanding of d/c instructions and education about side effects of tecentriq and when/how to f/u with CC.  Pt denies any questions or concerns at time of d/c.  Ambulatory w/steady gait to exit with belongings including educational folder and d/c paperwork, spouse taking pt home.  Tolerated tecentriq infusion well.

## 2019-02-16 ENCOUNTER — Ambulatory Visit: Admission: RE | Admit: 2019-02-16 | Payer: Medicare Other | Source: Ambulatory Visit

## 2019-02-16 ENCOUNTER — Telehealth: Payer: Self-pay | Admitting: *Deleted

## 2019-02-16 ENCOUNTER — Other Ambulatory Visit: Payer: Self-pay | Admitting: Interventional Radiology

## 2019-02-16 ENCOUNTER — Other Ambulatory Visit: Payer: Medicare Other

## 2019-02-16 ENCOUNTER — Ambulatory Visit: Payer: Medicare Other

## 2019-02-16 DIAGNOSIS — C220 Liver cell carcinoma: Secondary | ICD-10-CM

## 2019-02-16 NOTE — Telephone Encounter (Signed)
-----   Message from Arman Bogus, RN sent at 02/15/2019  4:52 PM EDT ----- Regarding: Burr Medico 1st time tecentriq First time tecentriq, tolerated well

## 2019-02-16 NOTE — Telephone Encounter (Signed)
Called [name] at [phone].  Message left requesting a return call for chemotherapy follow up.  Awaiting return call from patient.

## 2019-02-23 ENCOUNTER — Other Ambulatory Visit: Payer: Self-pay

## 2019-02-23 ENCOUNTER — Telehealth: Payer: Medicare Other

## 2019-02-23 ENCOUNTER — Ambulatory Visit: Payer: Medicare Other | Admitting: Gastroenterology

## 2019-02-23 ENCOUNTER — Ambulatory Visit (INDEPENDENT_AMBULATORY_CARE_PROVIDER_SITE_OTHER): Payer: Medicare Other | Admitting: Gastroenterology

## 2019-02-23 ENCOUNTER — Ambulatory Visit: Admission: RE | Admit: 2019-02-23 | Payer: Medicare Other | Source: Ambulatory Visit

## 2019-02-23 ENCOUNTER — Other Ambulatory Visit: Payer: Self-pay | Admitting: Interventional Radiology

## 2019-02-23 ENCOUNTER — Encounter: Payer: Self-pay | Admitting: Gastroenterology

## 2019-02-23 ENCOUNTER — Telehealth: Payer: Self-pay | Admitting: Emergency Medicine

## 2019-02-23 VITALS — BP 98/60 | HR 120 | Temp 98.6°F | Ht 67.0 in | Wt 152.0 lb

## 2019-02-23 DIAGNOSIS — R112 Nausea with vomiting, unspecified: Secondary | ICD-10-CM

## 2019-02-23 DIAGNOSIS — B182 Chronic viral hepatitis C: Secondary | ICD-10-CM

## 2019-02-23 DIAGNOSIS — C220 Liver cell carcinoma: Secondary | ICD-10-CM

## 2019-02-23 NOTE — Progress Notes (Addendum)
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  Upper endoscopy recommended to screen for varices in preparation for treatment with Avastin as well as to evaluate for treatable causes of nausea such as reflux, esophagitis, H. pylori, gastritis, and even peptic ulcer disease given his frequent NSAIDs.    PLAN: EGD for further evaluation  I consented the patient discussing the risks, benefits, and alternatives to endoscopic evaluation. In particular, we discussed the risks that include, but are not limited to, reaction to medication, cardiopulmonary compromise, bleeding requiring blood transfusion, aspiration resulting in pneumonia, perforation requiring surgery, lack of diagnosis, severe illness requiring hospitalization, and even death. We reviewed the risk of missed lesion including polyps or even cancer. The patient acknowledges these risks and asks that we proceed.  HPI: [name] is a 53 y.o. male referred by Dr. Burr Medico for further evaluation of right upper quadrant pain and nausea. There is also a specific request to rule out varices due to the risk of thrombosis from Avastin.  The history is obtained through the patient, his partner of the last 15 years who accompanies him to this appointment, and review of his electronic health record. 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   He was diagnosed with genotype 1a hepatitis C with a viral load of 6,550,000 IU/mL in February 2019.  A FibroTest at the time of diagnosis showed  F3 fibrosis.  Has been seen by Dr. Linus Salmons who did not recommend antiviral therapy until cancer was controlled.  Prior imaging showed no cirrhosis.  There is no other prior history of decompensation except for jaundice prior to his hepatocellular carcinoma diagnosis.  He was diagnosed with stage IIIa hepatocellular carcinoma in May 2019.  He was treated with TACE chemoembolization on 03/24/2018 with Dr. Kathlene Cote.  His repeat MRI in November 2019 showed progressive disease except for the treated lesion in segment 4A.  He had Y 90 treatments in February 2020 and June 2020.  He had significant right upper quadrant pain after the first treatment but only worsened after his last Y 90 treatment.  CT of the abdomen and pelvis from 01/29/2019 and an MRI from earlier this month showed his previous treated liver lesions were improved but there are now 2 new liver lesions.  Given his disease progression Dr. Burr Medico has recommended systemic therapy atezolizumab (Tecentriq) and bevacizumab (Avastin) q3weeks.  Potential risk with Avastin include thrombosis, bleeding, and liver dysfunction.  Dr. Grant Ruts referred him to GI to evaluate for esophageal varices given this risk.  His primary complaints today are nausea, fatigue, and intermittent nonradiating right upper quadrant abdominal pain his nausea is controlled with Zofran.  He has lost 10 pounds since early July.  Dr. Ernestina Penna notes suggest the abdominal pain abdominal pain is related to his new liver lesions. His hydrocodone dose was recently increased in an effort to achieve better pain control.  He also uses  Voltaren for pain control.  Labs from 01/19/2019 show an INR of 0.9.  Labs 01/29/2019 show a lipase of 30. Labs from 02/15/2019 showing normal comprehensive metabolic panel except  for glucose of 157, AST 129, ALT 97, albumin 3.3.  Total bilirubin is 1.0.  Creatinine 0.98.  White count 3.8, hemoglobin 14.1, platelets 237, MCV 88, RDW 12.8.  I personally reviewed multiple abdominal images including his CT of abdomen and pelvis without contrast from 01/29/2019 and his MRI with and without contrast from 02/08/2019.  Findings include multifocal hepatocellular carcinoma, hepatic steatosis and possible hepatic cirrhosis with a nodular contour to the liver.  No splenomegaly.  No varices or intra-abdominal collaterals seen.  He has a history of heavy alcohol use.  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  He denies the use of any marijuana or street drugs.  He smokes menthol cigarettes 1 to 2 cigarettes daily to half a pack per day. Past Medical History:  Diagnosis Date   Cancer ([place]) 2019   liver cancer   Chest pain    pt states when he turned over on other side aided in relief   Diabetes mellitus    Dyspnea    increased activity   Elevated cholesterol    Hepatitis C    Hypertension     Past Surgical History:  Procedure Laterality Date   I&D EXTREMITY  06/28/2011   Procedure: IRRIGATION AND DEBRIDEMENT EXTREMITY;  Surgeon: Linna Hoff;  Location: [facility];  Service: Orthopedics;  Laterality: Left;  with application of wound vac   INCISION AND DRAINAGE OF WOUND  07/02/2011   Procedure: IRRIGATION AND DEBRIDEMENT WOUND;  Surgeon: Linna Hoff;  Location: [facility];  Service: Orthopedics;  Laterality: Left;  Irrigation and Debridement of left arm wound with  Skin Grafting from left thigh    IR ANGIOGRAM SELECTIVE EACH ADDITIONAL VESSEL  03/18/2018   IR ANGIOGRAM SELECTIVE EACH ADDITIONAL VESSEL  08/26/2018   IR ANGIOGRAM SELECTIVE EACH ADDITIONAL VESSEL  08/26/2018   IR ANGIOGRAM SELECTIVE EACH ADDITIONAL VESSEL  08/26/2018   IR ANGIOGRAM SELECTIVE EACH ADDITIONAL VESSEL  08/26/2018   IR ANGIOGRAM SELECTIVE EACH ADDITIONAL VESSEL  09/13/2018   IR ANGIOGRAM  SELECTIVE EACH ADDITIONAL VESSEL  01/09/2019   IR ANGIOGRAM VISCERAL SELECTIVE  03/18/2018   IR ANGIOGRAM VISCERAL SELECTIVE  03/18/2018   IR ANGIOGRAM VISCERAL SELECTIVE  08/26/2018   IR ANGIOGRAM VISCERAL SELECTIVE  08/26/2018   IR ANGIOGRAM VISCERAL SELECTIVE  09/13/2018   IR ANGIOGRAM VISCERAL SELECTIVE  01/09/2019   IR EMBO ARTERIAL NOT HEMORR HEMANG INC GUIDE ROADMAPPING  08/26/2018   IR EMBO TUMOR ORGAN ISCHEMIA INFARCT INC GUIDE ROADMAPPING  03/18/2018   IR EMBO TUMOR ORGAN ISCHEMIA INFARCT INC GUIDE ROADMAPPING  09/13/2018   IR EMBO TUMOR ORGAN ISCHEMIA INFARCT INC GUIDE ROADMAPPING  01/09/2019   IR RADIOLOGIST EVAL & MGMT  02/16/2018   IR RADIOLOGIST EVAL & MGMT  07/12/2018   IR US GUIDE VASC ACCESS RIGHT  03/18/2018   IR US GUIDE VASC ACCESS RIGHT  08/26/2018   IR US GUIDE VASC ACCESS RIGHT  09/13/2018   IR US GUIDE VASC ACCESS RIGHT  01/09/2019   JOINT REPLACEMENT     PARTIAL HIP ARTHROPLASTY      Prior to Admission medications   Medication Sig Start Date End Date Taking? Authorizing Provider  cyclobenzaprine (FLEXERIL) 10 MG tablet Take 1/2 to 1 whole tablet by mouth every 8 hours as needed for muscle spasms. 07/22/18  Katy Apo, NP  diclofenac (VOLTAREN) 75 MG EC tablet Take 1 tablet (75 mg total) by mouth 2 (two) times daily as needed for moderate pain. 07/22/18   Katy Apo, NP  gabapentin (NEURONTIN) 300 MG capsule Take 1 capsule (300 mg total) by mouth 3 (three) times daily. 10/21/18   Lanae Boast, FNP  glipiZIDE (GLUCOTROL) 10 MG tablet TAKE 1 TABLET(10 MG) BY MOUTH TWICE DAILY BEFORE A MEAL 10/21/18   Lanae Boast, FNP  glucose blood test strip Check blood sugar 3 times per day prior to meals. Use as instructed 11/02/18   Lanae Boast, FNP  hydrochlorothiazide (HYDRODIURIL) 25 MG tablet TAKE 1 TABLET(25 MG) BY MOUTH DAILY Patient taking differently: Take 25 mg by mouth daily.  10/31/18   Lanae Boast, FNP  HYDROcodone-acetaminophen (NORCO) 5-325 MG  tablet Take 1 tablet by mouth every 8 (eight) hours as needed for moderate pain. 02/09/19   Truitt Merle, MD  insulin glargine (LANTUS) 100 UNIT/ML injection Inject 0.3 mLs (30 Units total) into the skin at bedtime. 10/21/18   Lanae Boast, FNP  Insulin Pen Needle (PEN NEEDLES) 32G X 4 MM MISC 1 packet by Does not apply route 3 (three) times daily as needed. 11/02/18   Lanae Boast, FNP  lisinopril (PRINIVIL,ZESTRIL) 20 MG tablet Take 1 tablet (20 mg total) by mouth daily. 10/06/18   Lanae Boast, FNP  metFORMIN (GLUCOPHAGE) 1000 MG tablet TAKE 1 TABLET(1000 MG) BY MOUTH TWICE DAILY 10/21/18   Lanae Boast, FNP  ondansetron (ZOFRAN) 4 MG tablet Take 1 tablet (4 mg total) by mouth every 8 (eight) hours as needed for nausea or vomiting. 01/20/19   Lanae Boast, FNP  pravastatin (PRAVACHOL) 80 MG tablet Take 1 tablet (80 mg total) by mouth daily. 10/21/18   Lanae Boast, FNP  predniSONE (DELTASONE) 50 MG tablet Take Prednisone 50 mg  13 hr,  7 hr,  And  1 hr before MRI scan  As directed. 01/23/19   Truitt Merle, MD  prochlorperazine (COMPAZINE) 10 MG tablet Take 1 tablet (10 mg total) by mouth every 6 (six) hours as needed for nausea or vomiting. 01/20/19   Truitt Merle, MD  senna (SENOKOT) 8.6 MG tablet Take 1 tablet by mouth as needed for constipation.    [provider]  Sofosbuvir-Velpatasvir (EPCLUSA) 400-100 MG TABS Take 1 tablet by mouth daily. 11/16/17   Thayer Headings, MD  Syringe, Disposable, (B-D 30CC SYRINGE) 30 ML MISC 1 Syringe by Does not apply route as directed. 10/06/18   Lanae Boast, FNP  traMADol (ULTRAM) 50 MG tablet TAKE 1 TABLET(50 MG) BY MOUTH EVERY 8 HOURS AS NEEDED Patient taking differently: Take 50 mg by mouth every 8 (eight) hours as needed for moderate pain.  12/01/18   Lanae Boast, FNP    Current Outpatient Medications  Medication Sig Dispense Refill   cyclobenzaprine (FLEXERIL) 10 MG tablet Take 1/2 to 1 whole tablet by mouth every 8 hours as needed for muscle  spasms. 15 tablet 0   diclofenac (VOLTAREN) 75 MG EC tablet Take 1 tablet (75 mg total) by mouth 2 (two) times daily as needed for moderate pain. 30 tablet 0   gabapentin (NEURONTIN) 300 MG capsule Take 1 capsule (300 mg total) by mouth 3 (three) times daily. 270 capsule 1   glipiZIDE (GLUCOTROL) 10 MG tablet TAKE 1 TABLET(10 MG) BY MOUTH TWICE DAILY BEFORE A MEAL 180 tablet 2   glucose blood test strip Check blood sugar 3 times per day prior to  meals. Use as instructed 100 each 12   hydrochlorothiazide (HYDRODIURIL) 25 MG tablet TAKE 1 TABLET(25 MG) BY MOUTH DAILY (Patient taking differently: Take 25 mg by mouth daily. ) 90 tablet 1   HYDROcodone-acetaminophen (NORCO) 5-325 MG tablet Take 1 tablet by mouth every 8 (eight) hours as needed for moderate pain. 40 tablet 0   insulin glargine (LANTUS) 100 UNIT/ML injection Inject 0.3 mLs (30 Units total) into the skin at bedtime. 10 mL 11   Insulin Pen Needle (PEN NEEDLES) 32G X 4 MM MISC 1 packet by Does not apply route 3 (three) times daily as needed. 100 each 2   lisinopril (PRINIVIL,ZESTRIL) 20 MG tablet Take 1 tablet (20 mg total) by mouth daily. 90 tablet 1   metFORMIN (GLUCOPHAGE) 1000 MG tablet TAKE 1 TABLET(1000 MG) BY MOUTH TWICE DAILY 180 tablet 0   ondansetron (ZOFRAN) 4 MG tablet Take 1 tablet (4 mg total) by mouth every 8 (eight) hours as needed for nausea or vomiting. 30 tablet 0   pravastatin (PRAVACHOL) 80 MG tablet Take 1 tablet (80 mg total) by mouth daily. 90 tablet 1   predniSONE (DELTASONE) 50 MG tablet Take Prednisone 50 mg  13 hr,  7 hr,  And  1 hr before MRI scan  As directed. 3 tablet 0   prochlorperazine (COMPAZINE) 10 MG tablet Take 1 tablet (10 mg total) by mouth every 6 (six) hours as needed for nausea or vomiting. 30 tablet 1   senna (SENOKOT) 8.6 MG tablet Take 1 tablet by mouth as needed for constipation.     Sofosbuvir-Velpatasvir (EPCLUSA) 400-100 MG TABS Take 1 tablet by mouth daily. 28 tablet 2    Syringe, Disposable, (B-D 30CC SYRINGE) 30 ML MISC 1 Syringe by Does not apply route as directed. 50 each 5   traMADol (ULTRAM) 50 MG tablet TAKE 1 TABLET(50 MG) BY MOUTH EVERY 8 HOURS AS NEEDED (Patient taking differently: Take 50 mg by mouth every 8 (eight) hours as needed for moderate pain. ) 30 tablet 0   No current facility-administered medications for this visit.     Allergies as of 02/23/2019 - Review Complete 02/23/2019  Allergen Reaction Noted   Gadolinium derivatives Hives and Itching 12/10/2017   Multihance [gadobenate] Itching 12/10/2017    Family History  Problem Relation Age of Onset   Diabetes Other    Hypertension Other    Diabetes Mother    Liver disease Father     Social History   Socioeconomic History   Marital status: Single    Spouse name: Not on file   Number of children: Not on file   Years of education: Not on file   Highest education level: Not on file  Occupational History   Not on file  Social Needs   Financial resource strain: Not on file   Food insecurity    Worry: Not on file    Inability: Not on file   Transportation needs    Medical: Not on file    Non-medical: Not on file  Tobacco Use   Smoking status: Current Every Day Smoker    Packs/day: 1.00    Years: 40.00    Pack years: 40.00    Types: Cigarettes   Smokeless tobacco: Never Used  Substance and Sexual Activity   Alcohol use: Yes    Alcohol/week: 40.0 - 41.0 standard drinks    Types: 2 - 3 Glasses of wine, 24 Cans of beer, 14 Standard drinks or equivalent per week  Comment: last drink yesterday 08/25/2018 2 40 oz beers   Drug use: No    Comment: history of iv drugs    Sexual activity: Yes  Lifestyle   Physical activity    Days per week: Not on file    Minutes per session: Not on file   Stress: Not on file  Relationships   Social connections    Talks on phone: Not on file    Gets together: Not on file    Attends religious service: Not on file     Active member of club or organization: Not on file    Attends meetings of clubs or organizations: Not on file    Relationship status: Not on file   Intimate partner violence    Fear of current or ex partner: Not on file    Emotionally abused: Not on file    Physically abused: Not on file    Forced sexual activity: Not on file  Other Topics Concern   Not on file  Social History Narrative   Not on file    Review of Systems: 12 system ROS is negative except as noted above.   Physical Exam: General:   Alert, in NAD. No scleral icterus. Bilateral temporal wasting present.  Heart:  Regular rate and rhythm; no murmurs Pulm: Clear anteriorly; no wheezing Abdomen:  Soft. Nontender. Nondistended. Normal bowel sounds. No rebound or guarding. No fluid wave.  The liver edge is palpable and tender.  Spleen tip not palpable. LAD: No inguinal or umbilical LAD Extremities:  Without edema. Neurologic:  Alert and  oriented x4;  grossly normal neurologically; no asterixis or clonus. Skin: No jaundice. No palmar erythema or spider angioma.  Psych:  Alert and cooperative. Normal mood and affect.  Jacklynn Dehaas L. Tarri Glenn, MD, MPH 02/24/2019, 1:45 PM

## 2019-02-23 NOTE — Telephone Encounter (Signed)
Covid-19 screening questions   Do you now or have you had a fever in the last 14 days? no  Do you have any respiratory symptoms of shortness of breath or cough now or in the last 14 days? no  Do you have any family members or close contacts with diagnosed or suspected Covid-19 in the past 14 days? no  Have you been tested for Covid-19 and found to be positive? No

## 2019-02-23 NOTE — Patient Instructions (Signed)
You have been scheduled for an endoscopy. Please follow written instructions given to you at your visit today. If you use inhalers (even only as needed), please bring them with you on the day of your procedure.

## 2019-02-23 NOTE — Progress Notes (Signed)
I called pt twice today for his phone with and f/u after his first treatment last week, was not able to reach patient, I left him a message for him to call back if needed.   Truitt Merle  02/27/2019

## 2019-02-27 ENCOUNTER — Inpatient Hospital Stay (HOSPITAL_BASED_OUTPATIENT_CLINIC_OR_DEPARTMENT_OTHER): Payer: Medicare Other | Admitting: Hematology

## 2019-02-27 DIAGNOSIS — Z794 Long term (current) use of insulin: Secondary | ICD-10-CM

## 2019-02-27 DIAGNOSIS — C220 Liver cell carcinoma: Secondary | ICD-10-CM

## 2019-02-27 DIAGNOSIS — E119 Type 2 diabetes mellitus without complications: Secondary | ICD-10-CM

## 2019-02-28 ENCOUNTER — Telehealth: Payer: Self-pay | Admitting: Hematology

## 2019-02-28 NOTE — Telephone Encounter (Signed)
No los per 7/27.

## 2019-03-01 ENCOUNTER — Telehealth: Payer: Self-pay | Admitting: Gastroenterology

## 2019-03-01 NOTE — Telephone Encounter (Signed)

## 2019-03-01 NOTE — Telephone Encounter (Signed)
No to all answer

## 2019-03-02 ENCOUNTER — Other Ambulatory Visit: Payer: Self-pay

## 2019-03-02 ENCOUNTER — Ambulatory Visit (AMBULATORY_SURGERY_CENTER): Payer: Medicare Other | Admitting: Gastroenterology

## 2019-03-02 ENCOUNTER — Encounter: Payer: Self-pay | Admitting: Gastroenterology

## 2019-03-02 VITALS — BP 100/62 | HR 84 | Temp 98.6°F | Resp 14 | Ht 67.0 in | Wt 152.0 lb

## 2019-03-02 DIAGNOSIS — K3189 Other diseases of stomach and duodenum: Secondary | ICD-10-CM

## 2019-03-02 DIAGNOSIS — B9681 Helicobacter pylori [H. pylori] as the cause of diseases classified elsewhere: Secondary | ICD-10-CM | POA: Diagnosis not present

## 2019-03-02 DIAGNOSIS — R112 Nausea with vomiting, unspecified: Secondary | ICD-10-CM | POA: Diagnosis not present

## 2019-03-02 DIAGNOSIS — K449 Diaphragmatic hernia without obstruction or gangrene: Secondary | ICD-10-CM | POA: Diagnosis not present

## 2019-03-02 DIAGNOSIS — K2970 Gastritis, unspecified, without bleeding: Secondary | ICD-10-CM

## 2019-03-02 DIAGNOSIS — B182 Chronic viral hepatitis C: Secondary | ICD-10-CM

## 2019-03-02 DIAGNOSIS — K2950 Unspecified chronic gastritis without bleeding: Secondary | ICD-10-CM | POA: Diagnosis not present

## 2019-03-02 DIAGNOSIS — K259 Gastric ulcer, unspecified as acute or chronic, without hemorrhage or perforation: Secondary | ICD-10-CM

## 2019-03-02 MED ORDER — PANTOPRAZOLE SODIUM 40 MG PO TBEC: DELAYED_RELEASE_TABLET | ORAL | 0 refills | Status: DC

## 2019-03-02 MED ORDER — SODIUM CHLORIDE 0.9 % IV SOLN: 500.0000 mL | Freq: Once | INTRAVENOUS | Status: DC

## 2019-03-02 NOTE — Op Note (Addendum)
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                            and cirrhosis noted on recent noncontrasted CT scan                           History of regular alcohol use                           Daily NSAIDs                           Normal platelets                           Normal PT/INR Medicines:                See the Anesthesia note for documentation of the                            administered medications Procedure:                Pre-Anesthesia Assessment:                           - Prior to the procedure, a History and Physical                            was performed, and patient medications and                            allergies were reviewed. The patient's tolerance of                            previous anesthesia was also reviewed. The risks  and benefits of the procedure and the sedation                            options and risks were discussed with the patient.                            All questions were answered, and informed consent                            was obtained. Prior Anticoagulants: The patient has                            taken no previous anticoagulant or antiplatelet                            agents. ASA Grade Assessment: [name] - A patient with                            severe systemic disease. After  reviewing the risks                            and benefits, the patient was deemed in                            satisfactory condition to undergo the procedure.                           After obtaining informed consent, the endoscope was                            passed under direct vision. Throughout the                            procedure, the patient's blood pressure, pulse, and                            oxygen saturations were monitored continuously. The                            Endoscope was introduced through the mouth, and                            advanced to the second part of duodenum. The upper                            GI endoscopy was accomplished without difficulty.                            The patient tolerated the procedure well. Scope In: Scope Out: Findings:                 There were esophageal mucosal changes at the [place]  junction suspicious for very short segement                            Barrett's esophagus present at the gastroesophageal                            junction. The maximum longitudinal extent of these                            mucosal changes was 0.2 cm in length. These were                            not biopsied today. No esophageal varices present.                           Multiple localized, small non-bleeding erosions and                            diffuse gastric erythema were found in the gastric                            body and in the gastric antrum. There were no                            stigmata of recent bleeding. Biopsies were taken                            with a cold forceps for histology. Estimated blood                            loss was minimal. No gastric varices or portal                            hypertensive gastropathy present.                           A small hiatal hernia was present.                           The examined duodenum was normal.                           The cardia  and gastric fundus were normal on                            retroflexion.                           The exam was otherwise without abnormality. Complications:            No immediate complications. Estimated blood loss:                            Minimal. Estimated Blood Loss:     Estimated blood loss was minimal. Impression:               -  Irregular z-line suspicious for Barrett's                            esophagus.                           - Non-bleeding erosive gastropathy and suspected                            gastritis. Biopsied.                           - Small hiatal hernia.                           - Normal examined duodenum.                           - No varices or portal hypertensive gastropathy                            present. Recommendation:           - Patient has a contact number available for                            emergencies. The signs and symptoms of potential                            delayed complications were discussed with the                            patient. Return to normal activities tomorrow.                            Written discharge instructions were provided to the                            patient.                           - Resume regular diet today.                           - Continue present medications.                           - Avoid all NSAIDs including diclofenac as you are                            able.                           - Pantoprazole 40 mg QAM x 8 weeks to treat the                            erosions.                           -  Await pathology results. Thornton Park MD, MD 03/02/2019 4:08:54 PM This report has been signed electronically.

## 2019-03-02 NOTE — Patient Instructions (Signed)
Please read handouts provided. Await pathology results. Avoid all NSAIDS including diclofenac as you can. Begin taking Pantoprazole 40 mg every morning for 8 weeks. Continue present medications.      YOU HAD AN ENDOSCOPIC PROCEDURE TODAY AT [facility]:   Refer to the procedure report that was given to you for any specific questions about what was found during the examination.  If the procedure report does not answer your questions, please call your gastroenterologist to clarify.  If you requested that your care partner not be given the details of your procedure findings, then the procedure report has been included in a sealed envelope for you to review at your convenience later.  YOU SHOULD EXPECT: Some feelings of bloating in the abdomen. Passage of more gas than usual.  Walking can help get rid of the air that was put into your GI tract during the procedure and reduce the bloating. If you had a lower endoscopy (such as a colonoscopy or flexible sigmoidoscopy) you may notice spotting of blood in your stool or on the toilet paper. If you underwent a bowel prep for your procedure, you may not have a normal bowel movement for a few days.  Please Note:  You might notice some irritation and congestion in your nose or some drainage.  This is from the oxygen used during your procedure.  There is no need for concern and it should clear up in a day or so.  SYMPTOMS TO REPORT IMMEDIATELY:     Following upper endoscopy (EGD)  Vomiting of blood or coffee ground material  New chest pain or pain under the shoulder blades  Painful or persistently difficult swallowing  New shortness of breath  Fever of 100F or higher  Black, tarry-looking stools  For urgent or emergent issues, a gastroenterologist can be reached at any hour by calling [phone].   DIET:  We do recommend a small meal at first, but then you may proceed to your regular diet.  Drink plenty of fluids but you  should avoid alcoholic beverages for 24 hours.  ACTIVITY:  You should plan to take it easy for the rest of today and you should NOT DRIVE or use heavy machinery until tomorrow (because of the sedation medicines used during the test).    FOLLOW UP: Our staff will call the number listed on your records 48-72 hours following your procedure to check on you and address any questions or concerns that you may have regarding the information given to you following your procedure. If we do not reach you, we will leave a message.  We will attempt to reach you two times.  During this call, we will ask if you have developed any symptoms of COVID 19. If you develop any symptoms (ie: fever, flu-like symptoms, shortness of breath, cough etc.) before then, please call [phone].  If you test positive for Covid 19 in the 2 weeks post procedure, please call and report this information to [place].    If any biopsies were taken you will be contacted by phone or by letter within the next 1-3 weeks.  Please call us at [phone] if you have not heard about the biopsies in 3 weeks.    SIGNATURES/CONFIDENTIALITY: You and/or your care partner have signed paperwork which will be entered into your electronic medical record.  These signatures attest to the fact that that the information above on your After Visit Summary has been reviewed and is understood.  Full responsibility of  the confidentiality of this discharge information lies with you and/or your care-partner.

## 2019-03-02 NOTE — Progress Notes (Signed)
Called to room to assist during endoscopic procedure.  Patient ID and intended procedure confirmed with present staff. Received instructions for my participation in the procedure from the performing physician.  

## 2019-03-02 NOTE — Progress Notes (Signed)
PT taken to PACU. Monitors in place. VSS. Report given to RN. 

## 2019-03-06 ENCOUNTER — Telehealth: Payer: Self-pay

## 2019-03-06 NOTE — Telephone Encounter (Signed)
Patient's girlfriend called with concern over endoscopy results, requested Dr. Burr Medico review, wanted to know if okay to proceed with treatment on Wednesday.  Per Dr. Burr Medico she reviewed endo report, okay to proceed with treatment on Wednesday, some areas are eroded but okay to have treatment.  I called her back to let her know.  She verbalized an understanding.

## 2019-03-06 NOTE — Telephone Encounter (Signed)
Left message on 2nd follow up call. 

## 2019-03-07 ENCOUNTER — Other Ambulatory Visit: Payer: Self-pay | Admitting: *Deleted

## 2019-03-07 MED ORDER — CLARITHROMYCIN 500 MG PO TABS: 500.0000 mg | ORAL_TABLET | Freq: Two times a day (BID) | ORAL | 0 refills | Status: AC

## 2019-03-07 MED ORDER — AMOXICILLIN 500 MG PO TABS: 1000.0000 mg | ORAL_TABLET | Freq: Two times a day (BID) | ORAL | 0 refills | Status: AC

## 2019-03-08 ENCOUNTER — Inpatient Hospital Stay: Payer: Medicare Other

## 2019-03-08 ENCOUNTER — Inpatient Hospital Stay: Payer: Medicare Other | Admitting: Hematology

## 2019-03-09 ENCOUNTER — Telehealth: Payer: Self-pay | Admitting: Hematology

## 2019-03-09 ENCOUNTER — Ambulatory Visit: Payer: Medicare Other

## 2019-03-09 ENCOUNTER — Ambulatory Visit: Payer: Medicare Other | Admitting: Hematology

## 2019-03-09 ENCOUNTER — Telehealth: Payer: Self-pay

## 2019-03-09 ENCOUNTER — Other Ambulatory Visit: Payer: Medicare Other

## 2019-03-09 NOTE — Telephone Encounter (Signed)
Per desk nurse rescheduled 8/6 appointments one week out - patient to be schedule for lab/fu/tx. Confirmed 8/14 appointments to patient.

## 2019-03-09 NOTE — Telephone Encounter (Signed)
Per 8/6 schedule message move lab to 12 pm, add infusion at 12:30 pm and leave YF at 1:15 pm. Spoke with patient and per pt he received a all from GI re having H-pylori and needs to start two heavy rounds of antibiotics. Patient/friend asking if provider intends to still proceed with today's appointments. Not able to reach YF. Spoke with desk nurse who will speak with YF and let me know how to proceed. Patient/friend aware.

## 2019-03-09 NOTE — Telephone Encounter (Signed)
Spoke with patient and his girlfriend [name].  Explained that H-pylori is not contagious, not related to his cancer, immunotherapy should not effect whether antibiotics work or not.  She and he verbalized an understanding.  They desire to reschedule for one week out.  Scheduling message was sent.

## 2019-03-15 NOTE — Progress Notes (Signed)
[place]   [phone] [phone]   Clinic Follow up Note   Patient Care Team: Lanae Boast, FNP as PCP - General (Family Medicine)  Date of Service:  03/17/2019  CHIEF COMPLAINT: F/u of [place]  SUMMARY OF ONCOLOGIC HISTORY: Oncology History  Hepatocellular carcinoma ([place])  12/07/2017 Initial Diagnosis   Hepatocellular carcinoma ([place])   12/07/2017 Imaging   US Abdomen 12/07/17 IMPRESSION: ULTRASOUND ABDOMEN: Probable 4 mm gallstone in gallbladder.  Multiple hepatic masses largest heterogeneous RIGHT lobe measuring 6.1 x 5.2 x 5.5 cm; metastatic disease and primary tumor not excluded, recommend further evaluation by MR imaging.  ULTRASOUND HEPATIC ELASTOGRAPHY:  Median hepatic shear wave velocity is calculated at 1.52 m/sec.  Corresponding Metavir fibrosis score is F2 + some F3.  Risk of fibrosis is moderate.  Follow-up: Additional testing appropriate   12/22/2017 PET scan   PET 12/22/17  IMPRESSION: 1. Lesions within the liver have low metabolic activity for size. 2. Dominant lesion in liver does have a focus of metabolic activity which corresponds to a low-density rounded region within the larger lesion. Target this 2 cm focus within the dominant lesion for biopsy. The liver lesions remain concerning for metastatic disease or primary liver carcinoma. Mucinous or cystic carcinomas can have low metabolic activity. 3. Focus of metabolic activity at the level of the hepatic flexure of the colon with some soft tissue thickening not clearly localized. Some additional foci of uptake within colon. This could represent physiologic activity however depending on the biopsy results of the liver recommend colonoscopy.   12/23/2017 Cancer Staging   Staging form: Liver, AJCC 8th Edition - Clinical stage from 12/23/2017: Stage IIIA (cT3, cN0, cM0) - Signed by Truitt Merle, MD on 12/30/2017   12/23/2017 Initial Biopsy   Diagnosis 12/23/17 Liver,  needle/core biopsy, Right lobe - HEPATOCELLULAR CARCINOMA. Microscopic Comment Dr. Lyndon Code has reviewed the case. Dr. Burr Medico was paged on 12/24/2017.     02/18/2018 Imaging   CT AP W WO Contrast 02/18/18  IMPRESSION: 1. Multifocal hepatocellular carcinoma, mildly progressive from baseline MRI 12/10/2017. 2. No evidence of extrahepatic nodal metastases or vascular involvement. 3. Borderline hepatic steatosis. No morphologic changes of underlying cirrhosis or portal hypertension. 4. Cholelithiasis. 5. Mild Aortic Atherosclerosis (ICD10-I70.0).   03/24/2018 Procedure   TACE Chemo ebolization on 03/24/18 with Dr. Kathlene Cote    06/14/2018 Imaging   MRI abdomen 06/14/18  IMPRESSION: 1. Dominant lesion in the central LEFT hepatic lobe (segment 4A) is stable in size, decreased in central enhancement, and with persistent nodular wall enhancement. 2. Multiple additional smaller lesions within LEFT and RIGHT hepatic lobe are increased in size. 3. Potential new lesions on the early arterial phase imaging.   08/16/2018 Imaging   CT Chest 08/16/18   IMPRESSION: Multifocal hepatic masses in this patient with known liver cancer, similar to prior MRI, although poorly evaluated.  No evidence of metastatic disease in the chest.  Aortic Atherosclerosis (ICD10-I70.0) and Emphysema (ICD10-J43.9).   09/13/2018 Procedure   Y90 treatment with Dr. Kathlene Cote on 09/13/18 and 01/09/19   01/29/2019 Imaging    CT AP WO contrast 01/29/19 IMPRESSION: 1. No acute findings within the abdomen or pelvis. 2. Mild decrease in multiple liver masses compared to previous MRI.   02/09/2019 Imaging   MRI Abdomen  IMPRESSION: 1. Today's study demonstrates a mixed response to therapy. Overall, most lesions are decreased in size compared to the prior examination, with the exception of 2 new lesions in the liver, as detailed above. Findings  remain most compatible with multifocal hepatocellular carcinoma. 2. Hepatic  steatosis and hepatic cirrhosis.   02/15/2019 -  Chemotherapy   atezolizumab (Tecentriq) and bevacizumab (Avastin) q3weeks starting with cycle 1 Tecentriq only starting 02/15/19, added Avastin with cycle 2.    03/02/2019 Procedure   Upper Endoscopy by Dr. Tarri Glenn 03/02/19  IMPRESSION - Irregular z-line suspicious for Barrett's esophagus. - Non-bleeding erosive gastropathy and suspected gastritis. Biopsied. - Small hiatal hernia. - Normal examined duodenum. - No varices or portal hypertensive gastropathy present. 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 - THERE IS NO EVIDENCE OF DYSPLASIA OR MALIGNANCY. - SEE COMMENT.      CURRENT THERAPY:  atezolizumab (Tecentriq) and bevacizumab (Avastin) q3weeks starting with cycle 1 Tecentriq only on 02/15/19. Added Avastin with cycle 2.   INTERVAL HISTORY:  [name] is here for a follow up. He called his wife to be included in the visit today. He notes last week he felt bad and did not come in for Avastin. He has been on antibiotics Amoxicillin 4 tabs daily and Augmentin once daily for his H. Pylori. He was given 40 pills although script was for twice daily for 10 days. He started them on 8/10. He notes having abdominal pain.  He notes his abdominal pain is 8/10 now. He takes Norco 1-2 times a day. When his pain improves he sleeps better but he tosses and turns. He does not tolerate Tramadol so he does not take it.  He notes he also has been taking oxycodone twice a day. He feels his Oxycodone 45m is stronger than the hydrocodone.    REVIEW OF SYSTEMS:   Constitutional: Denies fevers, chills or abnormal weight loss Eyes: Denies blurriness of vision Ears, nose, mouth, throat, and face: Denies mucositis or sore throat Respiratory: Denies cough, dyspnea or wheezes Cardiovascular: Denies palpitation, chest discomfort or lower extremity swelling Gastrointestinal:   Denies nausea, heartburn or change in bowel habits (+) Abdominal pain  Skin: Denies abnormal skin rashes Lymphatics: Denies new lymphadenopathy or easy bruising Neurological:Denies numbness, tingling or new weaknesses Behavioral/Psych: Mood is stable, no new changes  All other systems were reviewed with the patient and are negative.  MEDICAL HISTORY:  Past Medical History:  Diagnosis Date  . Cancer ([facility] 2019   liver cancer  . Chest pain    pt states when he turned over on other side aided in relief  . Diabetes mellitus   . Dyspnea    increased activity  . Elevated cholesterol   . Hepatitis C   . Hypertension     SURGICAL HISTORY: Past Surgical History:  Procedure Laterality Date  . I&D EXTREMITY  06/28/2011   Procedure: IRRIGATION AND DEBRIDEMENT EXTREMITY;  Surgeon: FLinna Hoff  [place] [facility]  Service: Orthopedics;  Laterality: Left;  with application of wound vac  . INCISION AND DRAINAGE OF WOUND  07/02/2011   Procedure: IRRIGATION AND DEBRIDEMENT WOUND;  Surgeon: FLinna Hoff  [place] [facility]  Service: Orthopedics;  Laterality: Left;  Irrigation and Debridement of left arm wound with  Skin Grafting from left thigh   . IR ANGIOGRAM SELECTIVE EACH ADDITIONAL VESSEL  03/18/2018  . IR ANGIOGRAM SELECTIVE EACH ADDITIONAL VESSEL  08/26/2018  . IR ANGIOGRAM SELECTIVE EACH ADDITIONAL VESSEL  08/26/2018  . IR ANGIOGRAM SELECTIVE EACH ADDITIONAL VESSEL  08/26/2018  . IR ANGIOGRAM SELECTIVE EACH ADDITIONAL VESSEL  08/26/2018  . IR ANGIOGRAM SELECTIVE EACH ADDITIONAL VESSEL  09/13/2018  .  IR ANGIOGRAM SELECTIVE EACH ADDITIONAL VESSEL  01/09/2019  . IR ANGIOGRAM VISCERAL SELECTIVE  03/18/2018  . IR ANGIOGRAM VISCERAL SELECTIVE  03/18/2018  . IR ANGIOGRAM VISCERAL SELECTIVE  08/26/2018  . IR ANGIOGRAM VISCERAL SELECTIVE  08/26/2018  . IR ANGIOGRAM VISCERAL SELECTIVE  09/13/2018  . IR ANGIOGRAM VISCERAL SELECTIVE  01/09/2019  . IR EMBO ARTERIAL NOT HEMORR HEMANG INC GUIDE ROADMAPPING   08/26/2018  . IR EMBO TUMOR ORGAN ISCHEMIA INFARCT INC GUIDE ROADMAPPING  03/18/2018  . IR EMBO TUMOR ORGAN ISCHEMIA INFARCT INC GUIDE ROADMAPPING  09/13/2018  . IR EMBO TUMOR ORGAN ISCHEMIA INFARCT INC GUIDE ROADMAPPING  01/09/2019  . IR RADIOLOGIST EVAL & MGMT  02/16/2018  . IR RADIOLOGIST EVAL & MGMT  07/12/2018  . IR US GUIDE VASC ACCESS RIGHT  03/18/2018  . IR US GUIDE VASC ACCESS RIGHT  08/26/2018  . IR US GUIDE VASC ACCESS RIGHT  09/13/2018  . IR US GUIDE VASC ACCESS RIGHT  01/09/2019  . JOINT REPLACEMENT    . PARTIAL HIP ARTHROPLASTY      I have reviewed the social history and family history with the patient and they are unchanged from previous note.  ALLERGIES:  is allergic to gadolinium derivatives and multihance [gadobenate].  MEDICATIONS:  Current Outpatient Medications  Medication Sig Dispense Refill  . amoxicillin (AMOXIL) 500 MG tablet Take 2 tablets (1,000 mg total) by mouth 2 (two) times daily for 10 days. 40 tablet 0  . clarithromycin (BIAXIN) 500 MG tablet Take 1 tablet (500 mg total) by mouth 2 (two) times daily for 10 days. 20 tablet 0  . diclofenac (VOLTAREN) 75 MG EC tablet Take 1 tablet (75 mg total) by mouth 2 (two) times daily as needed for moderate pain. 30 tablet 0  . gabapentin (NEURONTIN) 300 MG capsule Take 1 capsule (300 mg total) by mouth 3 (three) times daily. 270 capsule 1  . glipiZIDE (GLUCOTROL) 10 MG tablet TAKE 1 TABLET(10 MG) BY MOUTH TWICE DAILY BEFORE A MEAL 180 tablet 2  . glucose blood test strip Check blood sugar 3 times per day prior to meals. Use as instructed 100 each 12  . hydrochlorothiazide (HYDRODIURIL) 25 MG tablet TAKE 1 TABLET(25 MG) BY MOUTH DAILY (Patient taking differently: Take 25 mg by mouth daily. ) 90 tablet 1  . HYDROcodone-acetaminophen (NORCO) 5-325 MG tablet Take 1 tablet by mouth every 8 (eight) hours as needed for moderate pain. 40 tablet 0  . insulin glargine (LANTUS) 100 UNIT/ML injection Inject 0.3 mLs (30 Units total) into the  skin at bedtime. 10 mL 11  . Insulin Pen Needle (PEN NEEDLES) 32G X 4 MM MISC 1 packet by Does not apply route 3 (three) times daily as needed. 100 each 2  . lisinopril (PRINIVIL,ZESTRIL) 20 MG tablet Take 1 tablet (20 mg total) by mouth daily. 90 tablet 1  . metFORMIN (GLUCOPHAGE) 1000 MG tablet TAKE 1 TABLET(1000 MG) BY MOUTH TWICE DAILY 180 tablet 0  . ondansetron (ZOFRAN) 4 MG tablet Take 1 tablet (4 mg total) by mouth every 8 (eight) hours as needed for nausea or vomiting. 30 tablet 0  . pantoprazole (PROTONIX) 40 MG tablet Pantoprazole 40 mg every morning for 8 weeks. 90 tablet 0  . pravastatin (PRAVACHOL) 80 MG tablet Take 1 tablet (80 mg total) by mouth daily. 90 tablet 1  . predniSONE (DELTASONE) 50 MG tablet Take Prednisone 50 mg  13 hr,  7 hr,  And  1 hr before MRI scan  As directed. 3  tablet 0  . prochlorperazine (COMPAZINE) 10 MG tablet Take 1 tablet (10 mg total) by mouth every 6 (six) hours as needed for nausea or vomiting. 30 tablet 1  . senna (SENOKOT) 8.6 MG tablet Take 1 tablet by mouth as needed for constipation.    . Syringe, Disposable, (B-D 30CC SYRINGE) 30 ML MISC 1 Syringe by Does not apply route as directed. 50 each 5  . cyclobenzaprine (FLEXERIL) 10 MG tablet Take 1/2 to 1 whole tablet by mouth every 8 hours as needed for muscle spasms. (Patient not taking: Reported on 03/02/2019) 15 tablet 0  . oxyCODONE (OXY IR/ROXICODONE) 5 MG immediate release tablet Take 1 tablet (5 mg total) by mouth every 8 (eight) hours as needed for severe pain. 90 tablet 0  . Sofosbuvir-Velpatasvir (EPCLUSA) 400-100 MG TABS Take 1 tablet by mouth daily. (Patient not taking: Reported on 03/02/2019) 28 tablet 2   No current facility-administered medications for this visit.     PHYSICAL EXAMINATION: ECOG PERFORMANCE STATUS: 2 - Symptomatic, <50% confined to bed  Vitals:   03/17/19 1135  BP: 124/87  Pulse: 96  Resp: 18  Temp: 98.2 F (36.8 C)  SpO2: 100%   Filed Weights   03/17/19 1135   Weight: 149 lb (67.6 kg)    GENERAL:alert, no distress and comfortable SKIN: skin color, texture, turgor are normal, no rashes or significant lesions EYES: normal, Conjunctiva are pink and non-injected, sclera clear  NECK: supple, thyroid normal size, non-tender, without nodularity LYMPH:  no palpable lymphadenopathy in the cervical, axillary  LUNGS: clear to auscultation and percussion with normal breathing effort HEART: regular rate & rhythm and no murmurs and no lower extremity edema ABDOMEN:abdomen soft, (+) tenderness at RUQ, normal bowel sounds Musculoskeletal:no cyanosis of digits and no clubbing  NEURO: alert & oriented x 3 with fluent speech, no focal motor/sensory deficits  LABORATORY DATA:  I have reviewed the data as listed CBC Latest Ref Rng & Units 03/17/2019 02/15/2019 01/29/2019  WBC 4.0 - 10.5 K/uL 3.8(L) 3.8(L) 4.2  Hemoglobin 13.0 - 17.0 g/dL 13.5 14.1 15.0  Hematocrit 39.0 - 52.0 % 40.3 41.8 46.2  Platelets 150 - 400 K/uL 220 237 261     CMP Latest Ref Rng & Units 03/17/2019 02/15/2019 01/29/2019  Glucose 70 - 99 mg/dL 136(H) 157(H) 231(H)  BUN 6 - 20 mg/dL 20 9 14   Creatinine 0.61 - 1.24 mg/dL 1.28(H) 0.98 1.00  Sodium 135 - 145 mmol/L 135 136 135  Potassium 3.5 - 5.1 mmol/L 4.8 4.1 4.6  Chloride 98 - 111 mmol/L 102 99 96(L)  CO2 22 - 32 mmol/L 21(L) 25 25  Calcium 8.9 - 10.3 mg/dL 9.6 9.3 10.1  Total Protein 6.5 - 8.1 g/dL 7.3 7.1 8.3(H)  Total Bilirubin 0.3 - 1.2 mg/dL 1.9(H) 1.0 1.9(H)  Alkaline Phos 38 - 126 U/L 79 101 87  AST 15 - 41 U/L 71(H) 129(H) 83(H)  ALT 0 - 44 U/L 63(H) 97(H) 76(H)      RADIOGRAPHIC STUDIES: I have personally reviewed the radiological images as listed and agreed with the findings in the report. No results found.   ASSESSMENT & PLAN:  [name] is a 53 y.o. male with   1.Hepatocellular carcinoma,stage IIIA (cT3N0M0) -He was diagnosed in 12/2017. He is s/p TACE Chemo embolization on 03/24/18 with Dr. Kathlene Cote.  -His  repeat MRI in 06/14/18 showed disease progression in liver, except the treated lesion in 4A.No extrahepatic disease on the recent abdominal MRI. We discussed that  his liver cancer is not curable at this stage, andgoal of therapy is to control disease andprolong his life. -He underwent Y90 treatmentson2/11/20 and 01/09/19.  -After latest Y90, he has worsening RUQ abdominal pain. He has hepatomegaly with significant tenderness on prior exam.  -Recent CT AP and MRI from June and July showed his previous treated liver lesions have much improved but now has 2 new liver lesions. Overall disease progression.  -I started him on atezolizumab (Tecentriq) and bevacizumab (Avastin) q3weeks. Started cycle 1 with Tecentriq only on 02/15/19, will add Avastin with cycle 2 today. His recent EGD showed no significant varices   -Labs reviewed, CBC and CMP WNL except WBC 3.8, BG 136, Cr 1.28, albumin 3.3, AST 71, ALT 63, tbili 1.9. Overall adequate to proceed with cycle 2 Tecentriq and Avastin today.  -I encouraged him to drink more water due to increased Cr.  -f/u in 3 weeks    2. Abdominal pain, nausea and constipation --Tramadol is no longer controlling his pain  -On 02/09/19 I increased hydrocodone to q8hours to better control pain. He can use Tylenol or ibuprofen for mild pain. -Latest CT and MRI showed his worsening pain is likely related to his new liver lesions.  -Per patient he notes he has been taking Oxycodone BID which was left over and Hydrocodone BID at the same time which is managing his pain.  -I discussed moving forward with only 1 drug, he is agreeable. Per patient Oxycodone works better for him. I will not refill hydrocodone anymore. I will prescribe Oxycodone 62m for him to take q8hours 1 month course. I will not refill sooner. (03/17/19) -I again discussed this is narcotics an should only be taken for severe pain as this can causes dependence. He understands.    3. Hepatitis C, untreated, H.  Pylorie   -He was diagnosed with Hepatitis Cin 09/2017, however he is unsure of how long he has had hepatitis C prior to his diagnosis.  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 -Dr. CBecky Augustanot recommendtreatment until Cancer is controlled. -I encouraged the patient to maintain follow up with Dr. CLinus Salmons -No significant liver cirrhosis on MRI. 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 -I advised the patient to maintain follow up with his PCP -BP normal lately.  5. Alcohol and smoking cessation -I advised and recommended with the patient that he does discontinue use of ETOH due to the patient already with multiple liver issues.  -He does smoke cigarettes and he has a 40 year history.Irepeatedlyadvised that the patient discontinue use of smoking cigarettes at this time due to the increase risk of lung cancer. -He has reduced smoking to 1 cigarettes a day. He has mild SOB and cough. I again encouraged him to completely quit.  -He reduced drinking to beer occasionally. I again recommended complete cessation. He understands.  6. Goal of care discussion  -The patient understands the goal of care is palliative. -he is full code    Plan -I filled Oxycodone [id number] prn today, WILL NOT REFILL EARLY  -Lab reviewed and adequate to proceed with Tecentriq and Avastin today  -Lab, f/u, Tecentriq and Avastin in 3 weeks  -will reach out to Dr.  Comer to see if he needs f/u with him    No problem-specific Assessment & Plan notes found for this encounter.   No orders of the defined types were placed in this encounter.  All questions were answered. The patient knows to call the  clinic with any problems, questions or concerns. No barriers to learning was detected. I spent 20 minutes counseling the patient face to face. The total time spent in the appointment was 25 minutes and more than 50% was on counseling and review of test results     Truitt Merle, MD 03/17/2019   I, Joslyn Devon, am acting as scribe for Truitt Merle, MD.   I have reviewed the above documentation for accuracy and completeness, and I agree with the above.

## 2019-03-17 ENCOUNTER — Other Ambulatory Visit: Payer: Self-pay

## 2019-03-17 ENCOUNTER — Inpatient Hospital Stay: Payer: Medicare Other | Attending: Hematology

## 2019-03-17 ENCOUNTER — Inpatient Hospital Stay (HOSPITAL_BASED_OUTPATIENT_CLINIC_OR_DEPARTMENT_OTHER): Payer: Medicare Other | Admitting: Hematology

## 2019-03-17 ENCOUNTER — Telehealth: Payer: Self-pay | Admitting: Hematology

## 2019-03-17 ENCOUNTER — Inpatient Hospital Stay: Payer: Medicare Other

## 2019-03-17 VITALS — BP 124/87 | HR 96 | Temp 98.2°F | Resp 18 | Ht 67.0 in | Wt 149.0 lb

## 2019-03-17 VITALS — BP 138/87

## 2019-03-17 DIAGNOSIS — C220 Liver cell carcinoma: Secondary | ICD-10-CM

## 2019-03-17 DIAGNOSIS — Z79899 Other long term (current) drug therapy: Secondary | ICD-10-CM | POA: Insufficient documentation

## 2019-03-17 DIAGNOSIS — Z7189 Other specified counseling: Secondary | ICD-10-CM

## 2019-03-17 DIAGNOSIS — E119 Type 2 diabetes mellitus without complications: Secondary | ICD-10-CM | POA: Insufficient documentation

## 2019-03-17 DIAGNOSIS — Z5112 Encounter for antineoplastic immunotherapy: Secondary | ICD-10-CM | POA: Diagnosis not present

## 2019-03-17 DIAGNOSIS — Z794 Long term (current) use of insulin: Secondary | ICD-10-CM | POA: Insufficient documentation

## 2019-03-17 DIAGNOSIS — F17210 Nicotine dependence, cigarettes, uncomplicated: Secondary | ICD-10-CM | POA: Diagnosis not present

## 2019-03-17 DIAGNOSIS — E7800 Pure hypercholesterolemia, unspecified: Secondary | ICD-10-CM | POA: Diagnosis not present

## 2019-03-17 DIAGNOSIS — I10 Essential (primary) hypertension: Secondary | ICD-10-CM | POA: Insufficient documentation

## 2019-03-17 DIAGNOSIS — C787 Secondary malignant neoplasm of liver and intrahepatic bile duct: Secondary | ICD-10-CM

## 2019-03-17 LAB — CBC WITH DIFFERENTIAL (CANCER CENTER ONLY)
Abs Immature Granulocytes: 0.03 10*3/uL (ref 0.00–0.07)
Basophils Absolute: 0 10*3/uL (ref 0.0–0.1)
Basophils Relative: 1 %
Eosinophils Absolute: 0 10*3/uL (ref 0.0–0.5)
Eosinophils Relative: 1 %
HCT: 40.3 % (ref 39.0–52.0)
Hemoglobin: 13.5 g/dL (ref 13.0–17.0)
Immature Granulocytes: 1 %
Lymphocytes Relative: 12 %
Lymphs Abs: 0.5 10*3/uL — ABNORMAL LOW (ref 0.7–4.0)
MCH: 29.9 pg (ref 26.0–34.0)
MCHC: 33.5 g/dL (ref 30.0–36.0)
MCV: 89.2 fL (ref 80.0–100.0)
Monocytes Absolute: 0.7 10*3/uL (ref 0.1–1.0)
Monocytes Relative: 18 %
Neutro Abs: 2.6 10*3/uL (ref 1.7–7.7)
Neutrophils Relative %: 67 %
Platelet Count: 220 10*3/uL (ref 150–400)
RBC: 4.52 MIL/uL (ref 4.22–5.81)
RDW: 12.8 % (ref 11.5–15.5)
WBC Count: 3.8 10*3/uL — ABNORMAL LOW (ref 4.0–10.5)
nRBC: 0 % (ref 0.0–0.2)

## 2019-03-17 LAB — CMP (CANCER CENTER ONLY)
ALT: 63 U/L — ABNORMAL HIGH (ref 0–44)
AST: 71 U/L — ABNORMAL HIGH (ref 15–41)
Albumin: 3.3 g/dL — ABNORMAL LOW (ref 3.5–5.0)
Alkaline Phosphatase: 79 U/L (ref 38–126)
Anion gap: 12 (ref 5–15)
BUN: 20 mg/dL (ref 6–20)
CO2: 21 mmol/L — ABNORMAL LOW (ref 22–32)
Calcium: 9.6 mg/dL (ref 8.9–10.3)
Chloride: 102 mmol/L (ref 98–111)
Creatinine: 1.28 mg/dL — ABNORMAL HIGH (ref 0.61–1.24)
GFR, Est AFR Am: >60 mL/min (ref >60)
GFR, Estimated: >60 mL/min (ref >60)
Glucose, Bld: 136 mg/dL — ABNORMAL HIGH (ref 70–99)
Potassium: 4.8 mmol/L (ref 3.5–5.1)
Sodium: 135 mmol/L (ref 135–145)
Total Bilirubin: 1.9 mg/dL — ABNORMAL HIGH (ref 0.3–1.2)
Total Protein: 7.3 g/dL (ref 6.5–8.1)

## 2019-03-17 LAB — TOTAL PROTEIN, URINE DIPSTICK: Protein, ur: NEGATIVE mg/dL

## 2019-03-17 MED ORDER — SODIUM CHLORIDE 0.9 % IV SOLN
Freq: Once | INTRAVENOUS | Status: AC
  Administered 2019-03-17: 14:00:00 via INTRAVENOUS
  Filled 2019-03-17: qty 250

## 2019-03-17 MED ORDER — SODIUM CHLORIDE 0.9 % IV SOLN
16.2000 mg/kg | Freq: Once | INTRAVENOUS | Status: AC
  Administered 2019-03-17: 15:00:00 1200 mg via INTRAVENOUS
  Filled 2019-03-17: qty 48

## 2019-03-17 MED ORDER — SODIUM CHLORIDE 0.9 % IV SOLN
1200.0000 mg | Freq: Once | INTRAVENOUS | Status: AC
  Administered 2019-03-17: 14:00:00 1200 mg via INTRAVENOUS
  Filled 2019-03-17: qty 20

## 2019-03-17 MED ORDER — OXYCODONE HCL 5 MG PO TABS: 5.0000 mg | ORAL_TABLET | Freq: Three times a day (TID) | ORAL | 0 refills | Status: DC | PRN

## 2019-03-17 NOTE — Telephone Encounter (Signed)
Scheduled appt per 8/14 los.  Patient received a print out while in scheduling.

## 2019-03-17 NOTE — Telephone Encounter (Signed)
Appointments already complete per 8/14 los. Gave patient avs report and appointments for September.

## 2019-03-17 NOTE — Progress Notes (Signed)
Per Dr. Burr Medico okay to treat with total bilirubin 1.7

## 2019-03-17 NOTE — Patient Instructions (Signed)
[facility] Discharge Instructions for Patients Receiving Chemotherapy  Today you received the following chemotherapy agent: Tecentriq  To help prevent nausea and vomiting after your treatment, we encourage you to take your nausea medication as prescribed.  If you develop nausea and vomiting that is not controlled by your nausea medication, call the clinic.   BELOW ARE SYMPTOMS THAT SHOULD BE REPORTED IMMEDIATELY:  *FEVER GREATER THAN 100.5 F  *CHILLS WITH OR WITHOUT FEVER  NAUSEA AND VOMITING THAT IS NOT CONTROLLED WITH YOUR NAUSEA MEDICATION  *UNUSUAL SHORTNESS OF BREATH  *UNUSUAL BRUISING OR BLEEDING  TENDERNESS IN MOUTH AND THROAT WITH OR WITHOUT PRESENCE OF ULCERS  *URINARY PROBLEMS  *BOWEL PROBLEMS  UNUSUAL RASH Items with * indicate a potential emergency and should be followed up as soon as possible.  Feel free to call the clinic should you have any questions or concerns. The clinic phone number is [phone].  Please show the [facility] at check-in to the Emergency Department and triage nurse.    Atezolizumab injection What is this medicine? ATEZOLIZUMAB (a te zoe LIZ ue mab) is a monoclonal antibody. It is used to treat bladder cancer (urothelial cancer), non-small cell lung cancer, small cell lung cancer, and breast cancer. This medicine may be used for other purposes; ask your health care provider or pharmacist if you have questions. COMMON BRAND NAME(S): Tecentriq What should I tell my health care provider before I take this medicine? They need to know if you have any of these conditions:  diabetes  immune system problems  infection  inflammatory bowel disease  liver disease  lung or breathing disease  lupus  nervous system problems like myasthenia gravis or Guillain-Barre syndrome  organ transplant  an unusual or allergic reaction to atezolizumab, other medicines, foods, dyes, or preservatives  pregnant or trying to  get pregnant  breast-feeding How should I use this medicine? This medicine is for infusion into a vein. It is given by a health care professional in a hospital or clinic setting. A special MedGuide will be given to you before each treatment. Be sure to read this information carefully each time. Talk to your pediatrician regarding the use of this medicine in children. Special care may be needed. Overdosage: If you think you have taken too much of this medicine contact a poison control center or emergency room at once. NOTE: This medicine is only for you. Do not share this medicine with others. What if I miss a dose? It is important not to miss your dose. Call your doctor or health care professional if you are unable to keep an appointment. What may interact with this medicine? Interactions have not been studied. This list may not describe all possible interactions. Give your health care provider a list of all the medicines, herbs, non-prescription drugs, or dietary supplements you use. Also tell them if you smoke, drink alcohol, or use illegal drugs. Some items may interact with your medicine. What should I watch for while using this medicine? Your condition will be monitored carefully while you are receiving this medicine. You may need blood work done while you are taking this medicine. Do not become pregnant while taking this medicine or for at least 5 months after stopping it. Women should inform their doctor if they wish to become pregnant or think they might be pregnant. There is a potential for serious side effects to an unborn child. Talk to your health care professional or pharmacist for more information. Do not  breast-feed an infant while taking this medicine or for at least 5 months after the last dose. What side effects may I notice from receiving this medicine? Side effects that you should report to your doctor or health care professional as soon as possible:  allergic reactions like  skin rash, itching or hives, swelling of the face, lips, or tongue  black, tarry stools  bloody or watery diarrhea  breathing problems  changes in vision  chest pain or chest tightness  chills  facial flushing  fever  headache  signs and symptoms of high blood sugar such as dizziness; dry mouth; dry skin; fruity breath; nausea; stomach pain; increased hunger or thirst; increased urination  signs and symptoms of liver injury like dark yellow or brown urine; general ill feeling or flu-like symptoms; light-colored stools; loss of appetite; nausea; right upper belly pain; unusually weak or tired; yellowing of the eyes or skin  stomach pain  trouble passing urine or change in the amount of urine Side effects that usually do not require medical attention (report to your doctor or health care professional if they continue or are bothersome):  cough  diarrhea  joint pain  muscle pain  muscle weakness  tiredness  weight loss This list may not describe all possible side effects. Call your doctor for medical advice about side effects. You may report side effects to FDA at 1-800-FDA-1088. Where should I keep my medicine? This drug is given in a hospital or clinic and will not be stored at home. NOTE: This sheet is a summary. It may not cover all possible information. If you have questions about this medicine, talk to your doctor, pharmacist, or health care provider.  2020 [employer]/Gold Standard (2017-10-22 09:33:38)   Bevacizumab injection What is this medicine? BEVACIZUMAB (be va SIZ yoo mab) is a monoclonal antibody. It is used to treat many types of cancer. This medicine may be used for other purposes; ask your health care provider or pharmacist if you have questions. COMMON BRAND NAME(S): Avastin, MVASI, Zirabev What should I tell my health care provider before I take this medicine? They need to know if you have any of these conditions:  diabetes  heart  disease  high blood pressure  history of coughing up blood  prior anthracycline chemotherapy (e.g., doxorubicin, daunorubicin, epirubicin)  recent or ongoing radiation therapy  recent or planning to have surgery  stroke  an unusual or allergic reaction to bevacizumab, hamster proteins, mouse proteins, other medicines, foods, dyes, or preservatives  pregnant or trying to get pregnant  breast-feeding How should I use this medicine? This medicine is for infusion into a vein. It is given by a health care professional in a hospital or clinic setting. Talk to your pediatrician regarding the use of this medicine in children. Special care may be needed. Overdosage: If you think you have taken too much of this medicine contact a poison control center or emergency room at once. NOTE: This medicine is only for you. Do not share this medicine with others. What if I miss a dose? It is important not to miss your dose. Call your doctor or health care professional if you are unable to keep an appointment. What may interact with this medicine? Interactions are not expected. This list may not describe all possible interactions. Give your health care provider a list of all the medicines, herbs, non-prescription drugs, or dietary supplements you use. Also tell them if you smoke, drink alcohol, or use illegal drugs. Some items may  interact with your medicine. What should I watch for while using this medicine? Your condition will be monitored carefully while you are receiving this medicine. You will need important blood work and urine testing done while you are taking this medicine. This medicine may increase your risk to bruise or bleed. Call your doctor or health care professional if you notice any unusual bleeding. This medicine should be started at least 28 days following major surgery and the site of the surgery should be totally healed. Check with your doctor before scheduling dental work or surgery  while you are receiving this treatment. Talk to your doctor if you have recently had surgery or if you have a wound that has not healed. Do not become pregnant while taking this medicine or for 6 months after stopping it. Women should inform their doctor if they wish to become pregnant or think they might be pregnant. There is a potential for serious side effects to an unborn child. Talk to your health care professional or pharmacist for more information. Do not breast-feed an infant while taking this medicine and for 6 months after the last dose. This medicine has caused ovarian failure in some women. This medicine may interfere with the ability to have a child. You should talk to your doctor or health care professional if you are concerned about your fertility. What side effects may I notice from receiving this medicine? Side effects that you should report to your doctor or health care professional as soon as possible:  allergic reactions like skin rash, itching or hives, swelling of the face, lips, or tongue  chest pain or chest tightness  chills  coughing up blood  high fever  seizures  severe constipation  signs and symptoms of bleeding such as bloody or black, tarry stools; red or dark-brown urine; spitting up blood or brown material that looks like coffee grounds; red spots on the skin; unusual bruising or bleeding from the eye, gums, or nose  signs and symptoms of a blood clot such as breathing problems; chest pain; severe, sudden headache; pain, swelling, warmth in the leg  signs and symptoms of a stroke like changes in vision; confusion; trouble speaking or understanding; severe headaches; sudden numbness or weakness of the face, arm or leg; trouble walking; dizziness; loss of balance or coordination  stomach pain  sweating  swelling of legs or ankles  vomiting  weight gain Side effects that usually do not require medical attention (report to your doctor or health care  professional if they continue or are bothersome):  back pain  changes in taste  decreased appetite  dry skin  nausea  tiredness This list may not describe all possible side effects. Call your doctor for medical advice about side effects. You may report side effects to FDA at 1-800-FDA-1088. Where should I keep my medicine? This drug is given in a hospital or clinic and will not be stored at home. NOTE: This sheet is a summary. It may not cover all possible information. If you have questions about this medicine, talk to your doctor, pharmacist, or health care provider.  2020 [employer]vier/Gold Standard (2016-07-17 14:33:29)

## 2019-03-18 ENCOUNTER — Encounter: Payer: Self-pay | Admitting: Hematology

## 2019-03-22 ENCOUNTER — Other Ambulatory Visit: Payer: Self-pay | Admitting: Family Medicine

## 2019-03-22 DIAGNOSIS — C220 Liver cell carcinoma: Secondary | ICD-10-CM

## 2019-03-27 ENCOUNTER — Ambulatory Visit: Payer: Medicare Other | Admitting: Gastroenterology

## 2019-04-03 NOTE — Progress Notes (Signed)
[name]   [phone] Fax:([phone]   Clinic Follow up Note   Patient Care Team: Lanae Boast, FNP as PCP - General (Family Medicine) Comer, Okey Regal, MD as Consulting Physician (Infectious Diseases) Truitt Merle, MD as Consulting Physician (Hematology)  Date of Service:  04/06/2019  CHIEF COMPLAINT: F/u of [place]  SUMMARY OF ONCOLOGIC HISTORY: Oncology History  Hepatocellular carcinoma ([place])  12/07/2017 Initial Diagnosis   Hepatocellular carcinoma ([place])   12/07/2017 Imaging   US Abdomen 12/07/17 IMPRESSION: ULTRASOUND ABDOMEN: Probable 4 mm gallstone in gallbladder.  Multiple hepatic masses largest heterogeneous RIGHT lobe measuring 6.1 x 5.2 x 5.5 cm; metastatic disease and primary tumor not excluded, recommend further evaluation by MR imaging.  ULTRASOUND HEPATIC ELASTOGRAPHY:  Median hepatic shear wave velocity is calculated at 1.52 m/sec.  Corresponding Metavir fibrosis score is F2 + some F3.  Risk of fibrosis is moderate.  Follow-up: Additional testing appropriate   12/22/2017 PET scan   PET 12/22/17  IMPRESSION: 1. Lesions within the liver have low metabolic activity for size. 2. Dominant lesion in liver does have a focus of metabolic activity which corresponds to a low-density rounded region within the larger lesion. Target this 2 cm focus within the dominant lesion for biopsy. The liver lesions remain concerning for metastatic disease or primary liver carcinoma. Mucinous or cystic carcinomas can have low metabolic activity. 3. Focus of metabolic activity at the level of the hepatic flexure of the colon with some soft tissue thickening not clearly localized. Some additional foci of uptake within colon. This could represent physiologic activity however depending on the biopsy results of the liver recommend colonoscopy.   12/23/2017 Cancer Staging   Staging form: Liver, AJCC 8th Edition - Clinical stage from 12/23/2017: Stage  IIIA (cT3, cN0, cM0) - Signed by Truitt Merle, MD on 12/30/2017   12/23/2017 Initial Biopsy   Diagnosis 12/23/17 Liver, needle/core biopsy, Right lobe - HEPATOCELLULAR CARCINOMA. Microscopic Comment Dr. Lyndon Code has reviewed the case. Dr. Burr Medico was paged on 12/24/2017.     02/18/2018 Imaging   CT AP W WO Contrast 02/18/18  IMPRESSION: 1. Multifocal hepatocellular carcinoma, mildly progressive from baseline MRI 12/10/2017. 2. No evidence of extrahepatic nodal metastases or vascular involvement. 3. Borderline hepatic steatosis. No morphologic changes of underlying cirrhosis or portal hypertension. 4. Cholelithiasis. 5. Mild Aortic Atherosclerosis (ICD10-I70.0).   03/24/2018 Procedure   TACE Chemo ebolization on 03/24/18 with Dr. Kathlene Cote    06/14/2018 Imaging   MRI abdomen 06/14/18  IMPRESSION: 1. Dominant lesion in the central LEFT hepatic lobe (segment 4A) is stable in size, decreased in central enhancement, and with persistent nodular wall enhancement. 2. Multiple additional smaller lesions within LEFT and RIGHT hepatic lobe are increased in size. 3. Potential new lesions on the early arterial phase imaging.   08/16/2018 Imaging   CT Chest 08/16/18   IMPRESSION: Multifocal hepatic masses in this patient with known liver cancer, similar to prior MRI, although poorly evaluated.  No evidence of metastatic disease in the chest.  Aortic Atherosclerosis (ICD10-I70.0) and Emphysema (ICD10-J43.9).   09/13/2018 Procedure   Y90 treatment with Dr. Kathlene Cote on 09/13/18 and 01/09/19   01/29/2019 Imaging    CT AP WO contrast 01/29/19 IMPRESSION: 1. No acute findings within the abdomen or pelvis. 2. Mild decrease in multiple liver masses compared to previous MRI.   02/09/2019 Imaging   MRI Abdomen  IMPRESSION: 1. Today's study demonstrates a mixed response to therapy. Overall, most lesions are decreased in size compared to the  prior examination, with the exception of 2 new lesions in the  liver, as detailed above. Findings remain most compatible with multifocal hepatocellular carcinoma. 2. Hepatic steatosis and hepatic cirrhosis.   02/15/2019 -  Chemotherapy   atezolizumab (Tecentriq) and bevacizumab (Avastin) q3weeks starting with cycle 1 Tecentriq only starting 02/15/19, added Avastin with cycle 2.    03/02/2019 Procedure   Upper Endoscopy by Dr. Tarri Glenn 03/02/19  IMPRESSION - Irregular z-line suspicious for Barrett's esophagus. - Non-bleeding erosive gastropathy and suspected gastritis. Biopsied. - Small hiatal hernia. - Normal examined duodenum. - No varices or portal hypertensive gastropathy present. 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 - THERE IS NO EVIDENCE OF DYSPLASIA OR MALIGNANCY. - SEE COMMENT.      CURRENT THERAPY:  atezolizumab(Tecentriq)and bevacizumab (Avastin) q3weeks starting with cycle 1 Tecentriq only on 02/15/19. Added Avastin with cycle 2.  INTERVAL HISTORY:  [name] is here for a follow up and treatment. He presents to the clinic alone. He called his wife to be included in the visit today. He notes his abdominal pain is better lately. His wife notes he is eating better. He has been taking oxycodone 29m q8hours. He notes he does not take Gabapentin often as it makes him drowsy. He is currently looking for a new PCP as his current one is no longer covered by his insurance.     REVIEW OF SYSTEMS:   Constitutional: Denies fevers, chills or abnormal weight loss (+) Improved eating Eyes: Denies blurriness of vision Ears, nose, mouth, throat, and face: Denies mucositis or sore throat Respiratory: Denies cough, dyspnea or wheezes Cardiovascular: Denies palpitation, chest discomfort or lower extremity swelling Gastrointestinal:  Denies nausea, heartburn or change in bowel habits (+) Improved abdominal pain.  Skin: Denies abnormal skin rashes Lymphatics: Denies  new lymphadenopathy or easy bruising Neurological:Denies numbness, tingling or new weaknesses Behavioral/Psych: Mood is stable, no new changes  All other systems were reviewed with the patient and are negative.  MEDICAL HISTORY:  Past Medical History:  Diagnosis Date  . [place]   liver cancer  . Chest pain    pt states when he turned over on other side aided in relief  . Diabetes mellitus   . Dyspnea    increased activity  . Elevated cholesterol   . Hepatitis C   . Hypertension     SURGICAL HISTORY: Past Surgical History:  Procedure Laterality Date  . I&D EXTREMITY  06/28/2011   Procedure: IRRIGATION AND DEBRIDEMENT EXTREMITY;  Surgeon: FLinna Hoff  [place] [facility]  Service: Orthopedics;  Laterality: Left;  with application of wound vac  . INCISION AND DRAINAGE OF WOUND  07/02/2011   Procedure: IRRIGATION AND DEBRIDEMENT WOUND;  Surgeon: FLinna Hoff  [place] [facility]  Service: Orthopedics;  Laterality: Left;  Irrigation and Debridement of left arm wound with  Skin Grafting from left thigh   . IR ANGIOGRAM SELECTIVE EACH ADDITIONAL VESSEL  03/18/2018  . IR ANGIOGRAM SELECTIVE EACH ADDITIONAL VESSEL  08/26/2018  . IR ANGIOGRAM SELECTIVE EACH ADDITIONAL VESSEL  08/26/2018  . IR ANGIOGRAM SELECTIVE EACH ADDITIONAL VESSEL  08/26/2018  . IR ANGIOGRAM SELECTIVE EACH ADDITIONAL VESSEL  08/26/2018  . IR ANGIOGRAM SELECTIVE EACH ADDITIONAL VESSEL  09/13/2018  . IR ANGIOGRAM SELECTIVE EACH ADDITIONAL VESSEL  01/09/2019  . IR ANGIOGRAM VISCERAL SELECTIVE  03/18/2018  . IR ANGIOGRAM VISCERAL SELECTIVE  03/18/2018  . IR ANGIOGRAM VISCERAL SELECTIVE  08/26/2018  .  IR ANGIOGRAM VISCERAL SELECTIVE  08/26/2018  . IR ANGIOGRAM VISCERAL SELECTIVE  09/13/2018  . IR ANGIOGRAM VISCERAL SELECTIVE  01/09/2019  . IR EMBO ARTERIAL NOT HEMORR HEMANG INC GUIDE ROADMAPPING  08/26/2018  . IR EMBO TUMOR ORGAN ISCHEMIA INFARCT INC GUIDE ROADMAPPING  03/18/2018  . IR EMBO TUMOR ORGAN ISCHEMIA INFARCT INC  GUIDE ROADMAPPING  09/13/2018  . IR EMBO TUMOR ORGAN ISCHEMIA INFARCT INC GUIDE ROADMAPPING  01/09/2019  . IR RADIOLOGIST EVAL & MGMT  02/16/2018  . IR RADIOLOGIST EVAL & MGMT  07/12/2018  . IR US GUIDE VASC ACCESS RIGHT  03/18/2018  . IR US GUIDE VASC ACCESS RIGHT  08/26/2018  . IR US GUIDE VASC ACCESS RIGHT  09/13/2018  . IR US GUIDE VASC ACCESS RIGHT  01/09/2019  . JOINT REPLACEMENT    . PARTIAL HIP ARTHROPLASTY      I have reviewed the social history and family history with the patient and they are unchanged from previous note.  ALLERGIES:  is allergic to gadolinium derivatives and multihance [gadobenate].  MEDICATIONS:  Current Outpatient Medications  Medication Sig Dispense Refill  . cyclobenzaprine (FLEXERIL) 10 MG tablet Take 1/2 to 1 whole tablet by mouth every 8 hours as needed for muscle spasms. 15 tablet 0  . diclofenac (VOLTAREN) 75 MG EC tablet Take 1 tablet (75 mg total) by mouth 2 (two) times daily as needed for moderate pain. 30 tablet 0  . gabapentin (NEURONTIN) 300 MG capsule Take 1 capsule (300 mg total) by mouth 3 (three) times daily. 270 capsule 1  . glipiZIDE (GLUCOTROL) 10 MG tablet TAKE 1 TABLET(10 MG) BY MOUTH TWICE DAILY BEFORE A MEAL 180 tablet 2  . glucose blood test strip Check blood sugar 3 times per day prior to meals. Use as instructed 100 each 12  . hydrochlorothiazide (HYDRODIURIL) 25 MG tablet TAKE 1 TABLET(25 MG) BY MOUTH DAILY (Patient taking differently: Take 25 mg by mouth daily. ) 90 tablet 1  . insulin glargine (LANTUS) 100 UNIT/ML injection Inject 0.3 mLs (30 Units total) into the skin at bedtime. 10 mL 11  . Insulin Pen Needle (PEN NEEDLES) 32G X 4 MM MISC 1 packet by Does not apply route 3 (three) times daily as needed. 100 each 2  . lisinopril (PRINIVIL,ZESTRIL) 20 MG tablet Take 1 tablet (20 mg total) by mouth daily. 90 tablet 1  . metFORMIN (GLUCOPHAGE) 1000 MG tablet TAKE 1 TABLET(1000 MG) BY MOUTH TWICE DAILY 180 tablet 0  . ondansetron (ZOFRAN) 4  MG tablet TAKE 1 TABLET(4 MG) BY MOUTH EVERY 8 HOURS AS NEEDED FOR NAUSEA OR VOMITING 30 tablet 0  . oxyCODONE (OXY IR/ROXICODONE) 5 MG immediate release tablet Take 1 tablet (5 mg total) by mouth every 8 (eight) hours as needed for severe pain. 90 tablet 0  . pantoprazole (PROTONIX) 40 MG tablet Pantoprazole 40 mg every morning for 8 weeks. 90 tablet 0  . pravastatin (PRAVACHOL) 80 MG tablet Take 1 tablet (80 mg total) by mouth daily. 90 tablet 1  . predniSONE (DELTASONE) 50 MG tablet Take Prednisone 50 mg  13 hr,  7 hr,  And  1 hr before MRI scan  As directed. 3 tablet 0  . prochlorperazine (COMPAZINE) 10 MG tablet Take 1 tablet (10 mg total) by mouth every 6 (six) hours as needed for nausea or vomiting. 30 tablet 1  . senna (SENOKOT) 8.6 MG tablet Take 1 tablet by mouth as needed for constipation.    . Sofosbuvir-Velpatasvir (EPCLUSA) 400-100 MG TABS  Take 1 tablet by mouth daily. 28 tablet 2  . Syringe, Disposable, (B-D 30CC SYRINGE) 30 ML MISC 1 Syringe by Does not apply route as directed. 50 each 5   No current facility-administered medications for this visit.     PHYSICAL EXAMINATION: ECOG PERFORMANCE STATUS: 1 - Symptomatic but completely ambulatory  Vitals:   04/06/19 1046  BP: 125/86  Pulse: 77  Resp: 18  Temp: 98.7 F (37.1 C)  SpO2: 99%   Filed Weights   04/06/19 1046  Weight: 149 lb 4.8 oz (67.7 kg)    GENERAL:alert, no distress and comfortable SKIN: skin color, texture, turgor are normal, no rashes or significant lesions EYES: normal, Conjunctiva are pink and non-injected, sclera clear  NECK: supple, thyroid normal size, non-tender, without nodularity LYMPH:  no palpable lymphadenopathy in the cervical, axillary  LUNGS: clear to auscultation and percussion with normal breathing effort HEART: regular rate & rhythm and no murmurs and no lower extremity edema ABDOMEN:abdomen soft, non-tender and normal bowel sounds Musculoskeletal:no cyanosis of digits and no clubbing   NEURO: alert & oriented x 3 with fluent speech, no focal motor/sensory deficits  LABORATORY DATA:  I have reviewed the data as listed CBC Latest Ref Rng & Units 04/06/2019 03/17/2019 02/15/2019  WBC 4.0 - 10.5 K/uL 2.2(L) 3.8(L) 3.8(L)  Hemoglobin 13.0 - 17.0 g/dL 13.8 13.5 14.1  Hematocrit 39.0 - 52.0 % 41.0 40.3 41.8  Platelets 150 - 400 K/uL 283 220 237     CMP Latest Ref Rng & Units 04/06/2019 03/17/2019 02/15/2019  Glucose 70 - 99 mg/dL 231(H) 136(H) 157(H)  BUN 6 - 20 mg/dL 11 20 9   Creatinine 0.61 - 1.24 mg/dL 0.98 1.28(H) 0.98  Sodium 135 - 145 mmol/L 135 135 136  Potassium 3.5 - 5.1 mmol/L 4.1 4.8 4.1  Chloride 98 - 111 mmol/L 101 102 99  CO2 22 - 32 mmol/L 27 21(L) 25  Calcium 8.9 - 10.3 mg/dL 9.3 9.6 9.3  Total Protein 6.5 - 8.1 g/dL 6.9 7.3 7.1  Total Bilirubin 0.3 - 1.2 mg/dL 1.1 1.9(H) 1.0  Alkaline Phos 38 - 126 U/L 110 79 101  AST 15 - 41 U/L 72(H) 71(H) 129(H)  ALT 0 - 44 U/L 48(H) 63(H) 97(H)      RADIOGRAPHIC STUDIES: I have personally reviewed the radiological images as listed and agreed with the findings in the report. No results found.   ASSESSMENT & PLAN:  [name] is a 53 y.o. male with   1.Hepatocellular carcinoma,stage IIIA (cT3N0M0) -He was diagnosed in 12/2017. He is s/p TACE Chemo embolization on 03/24/18 with Dr. Kathlene Cote.  -His repeat MRI in 06/14/18 showed disease progression in liver, except the treated lesion in 4A.No extrahepatic disease on the recent abdominal MRI. We discussed that his liver cancer is not curable at this stage, andgoal of therapy is to control disease andprolong his life. -He underwent Y90 treatmentson2/11/20 and 01/09/19.  -After latest Y90, he has worsening RUQ abdominal pain. He has hepatomegaly with significant tenderness on prior exam.  -Recent CT AP and MRI from June and July showed his previous treated liver lesions have much improved but now has 2 new liver lesions. Overall disease progression.  -I started him  on atezolizumab (Tecentriq) and bevacizumab (Avastin) q3weeks. Started cycle 1 with Tecentriq only on 02/15/19 and add Avastin with cycle 2. His recent EGD showed no significant varices -He has tolerated the treatment very well, no signs of AEs. His pain and eating has improved, which is  encouraging. Will monitor his AFP and scan after cycle 4 or 5.  -Labs reviewed, CBC and CMP WNL except WBC 2.2, ANC 1.4, BG 231, ALT 48, AST 72. Urine protein normal. AFP still pending. Overall adequate to proceed with cycle 3 Tecentriq and Avastin today.  -I encouraged him to watch for epistaxis and other signs of bleeding as Avastin can lead to this.  -f/u in 3 weeks    2. Abdominal pain, nausea and constipation -Tramadol is no longer controlling his pain  -On 02/09/19 I increased hydrocodone to q8hours to better control pain. He can use Tylenol or ibuprofen for mild pain. -Latest CT and MRI showed his worsening pain is likely related to his new liver lesions.  -Per patient he notes he has been taking Oxycodone BID which was left over and Hydrocodone BID at the same time which is managing his pain.  -To consolidate his pain medication, I will no longer refill hydrocodone and he will proceed with oxycodone 48m q8hours 1 month course (03/17/19). I will not refill sooner.  -I again discussed this is narcotics an should only be taken for severe pain as this can causes dependence. He understands.  -I also encouraged him to take Gabapentin at night as this can cause drowsiness but will help with his nerve pain.  -His pain is beginning to improve. Will monitor.    3. Hepatitis C, untreated, H. Pylorie   -He was diagnosed with Hepatitis Cin 09/2017, however he is unsure of how long he has had hepatitis C prior to his diagnosis.  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 -Dr. CBecky Augustanot recommendtreatment until Cancer is controlled. -I encouraged the patient to maintain follow up with Dr. CLinus Salmons  -No significant liver cirrhosis on MRI. 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 -I advised the patient to maintain follow up with his PCP -BP normal lately.  5. Alcohol and smoking cessation -I advised and recommended with the patient that he does discontinue use of ETOH due to the patient already with multiple liver issues.  -He does smoke cigarettes and he has a 40 year history.Irepeatedlyadvised that the patient discontinue use of smoking cigarettes at this time due to the increase risk of lung cancer. -He has reduced smoking to 1 cigarettes a day. He has mild SOB and cough. I again encouraged him to completely quit.  -He reduced drinking to beer occasionally. I again recommended complete cessation. He understands.  6. Goal of care discussion  -The patient understands the goal of care is palliative. -he is full code    Plan -Lab reviewed and adequate to proceed with Tecentriq and Avastin today   -Lab, f/u, Tecentriq and Avastin in 3 weeks    No problem-specific Assessment & Plan notes found for this encounter.   Orders Placed This Encounter  Procedures  . Total Protein, Urine dipstick    Standing Status:   Future    Number of Occurrences:   1    Standing Expiration Date:   04/05/2020   All questions were answered. The patient knows to call the clinic with any problems, questions or concerns. No barriers to learning was detected. I spent 15 minutes counseling the patient face to face.  The total time spent in the appointment was 20 minutes and more than 50% was on counseling and review of test results     Truitt Merle, MD 04/06/2019   I, Joslyn Devon, am acting as scribe for  Truitt Merle, MD.   I have reviewed the above documentation for accuracy and completeness, and I agree with the above.

## 2019-04-06 ENCOUNTER — Inpatient Hospital Stay (HOSPITAL_BASED_OUTPATIENT_CLINIC_OR_DEPARTMENT_OTHER): Payer: Medicare Other | Admitting: Hematology

## 2019-04-06 ENCOUNTER — Inpatient Hospital Stay: Payer: Medicare Other | Attending: Hematology

## 2019-04-06 ENCOUNTER — Other Ambulatory Visit: Payer: Self-pay

## 2019-04-06 ENCOUNTER — Inpatient Hospital Stay: Payer: Medicare Other

## 2019-04-06 ENCOUNTER — Telehealth: Payer: Self-pay | Admitting: Hematology

## 2019-04-06 ENCOUNTER — Encounter: Payer: Self-pay | Admitting: Hematology

## 2019-04-06 VITALS — BP 140/92

## 2019-04-06 VITALS — BP 125/86 | HR 77 | Temp 98.7°F | Resp 18 | Ht 67.0 in | Wt 149.3 lb

## 2019-04-06 DIAGNOSIS — Z5112 Encounter for antineoplastic immunotherapy: Secondary | ICD-10-CM | POA: Diagnosis not present

## 2019-04-06 DIAGNOSIS — Z9221 Personal history of antineoplastic chemotherapy: Secondary | ICD-10-CM | POA: Diagnosis not present

## 2019-04-06 DIAGNOSIS — R1010 Upper abdominal pain, unspecified: Secondary | ICD-10-CM | POA: Insufficient documentation

## 2019-04-06 DIAGNOSIS — E119 Type 2 diabetes mellitus without complications: Secondary | ICD-10-CM

## 2019-04-06 DIAGNOSIS — Z7189 Other specified counseling: Secondary | ICD-10-CM

## 2019-04-06 DIAGNOSIS — B1920 Unspecified viral hepatitis C without hepatic coma: Secondary | ICD-10-CM | POA: Insufficient documentation

## 2019-04-06 DIAGNOSIS — I700 Atherosclerosis of aorta: Secondary | ICD-10-CM | POA: Insufficient documentation

## 2019-04-06 DIAGNOSIS — K922 Gastrointestinal hemorrhage, unspecified: Secondary | ICD-10-CM | POA: Diagnosis not present

## 2019-04-06 DIAGNOSIS — C220 Liver cell carcinoma: Secondary | ICD-10-CM

## 2019-04-06 DIAGNOSIS — F17210 Nicotine dependence, cigarettes, uncomplicated: Secondary | ICD-10-CM | POA: Insufficient documentation

## 2019-04-06 DIAGNOSIS — Z794 Long term (current) use of insulin: Secondary | ICD-10-CM | POA: Diagnosis not present

## 2019-04-06 DIAGNOSIS — K7460 Unspecified cirrhosis of liver: Secondary | ICD-10-CM | POA: Insufficient documentation

## 2019-04-06 DIAGNOSIS — Z79899 Other long term (current) drug therapy: Secondary | ICD-10-CM | POA: Insufficient documentation

## 2019-04-06 DIAGNOSIS — Z791 Long term (current) use of non-steroidal anti-inflammatories (NSAID): Secondary | ICD-10-CM | POA: Diagnosis not present

## 2019-04-06 DIAGNOSIS — E7800 Pure hypercholesterolemia, unspecified: Secondary | ICD-10-CM | POA: Diagnosis not present

## 2019-04-06 DIAGNOSIS — K760 Fatty (change of) liver, not elsewhere classified: Secondary | ICD-10-CM | POA: Diagnosis not present

## 2019-04-06 DIAGNOSIS — I10 Essential (primary) hypertension: Secondary | ICD-10-CM | POA: Insufficient documentation

## 2019-04-06 LAB — CMP (CANCER CENTER ONLY)
ALT: 48 U/L — ABNORMAL HIGH (ref 0–44)
AST: 72 U/L — ABNORMAL HIGH (ref 15–41)
Albumin: 3 g/dL — ABNORMAL LOW (ref 3.5–5.0)
Alkaline Phosphatase: 110 U/L (ref 38–126)
Anion gap: 7 (ref 5–15)
BUN: 11 mg/dL (ref 6–20)
CO2: 27 mmol/L (ref 22–32)
Calcium: 9.3 mg/dL (ref 8.9–10.3)
Chloride: 101 mmol/L (ref 98–111)
Creatinine: 0.98 mg/dL (ref 0.61–1.24)
GFR, Est AFR Am: >60 mL/min (ref >60)
GFR, Estimated: >60 mL/min (ref >60)
Glucose, Bld: 231 mg/dL — ABNORMAL HIGH (ref 70–99)
Potassium: 4.1 mmol/L (ref 3.5–5.1)
Sodium: 135 mmol/L (ref 135–145)
Total Bilirubin: 1.1 mg/dL (ref 0.3–1.2)
Total Protein: 6.9 g/dL (ref 6.5–8.1)

## 2019-04-06 LAB — CBC WITH DIFFERENTIAL (CANCER CENTER ONLY)
Abs Immature Granulocytes: 0.01 10*3/uL (ref 0.00–0.07)
Basophils Absolute: 0 10*3/uL (ref 0.0–0.1)
Basophils Relative: 1 %
Eosinophils Absolute: 0 10*3/uL (ref 0.0–0.5)
Eosinophils Relative: 1 %
HCT: 41 % (ref 39.0–52.0)
Hemoglobin: 13.8 g/dL (ref 13.0–17.0)
Immature Granulocytes: 1 %
Lymphocytes Relative: 15 %
Lymphs Abs: 0.3 10*3/uL — ABNORMAL LOW (ref 0.7–4.0)
MCH: 29.6 pg (ref 26.0–34.0)
MCHC: 33.7 g/dL (ref 30.0–36.0)
MCV: 88 fL (ref 80.0–100.0)
Monocytes Absolute: 0.4 10*3/uL (ref 0.1–1.0)
Monocytes Relative: 19 %
Neutro Abs: 1.4 10*3/uL — ABNORMAL LOW (ref 1.7–7.7)
Neutrophils Relative %: 63 %
Platelet Count: 283 10*3/uL (ref 150–400)
RBC: 4.66 MIL/uL (ref 4.22–5.81)
RDW: 12.4 % (ref 11.5–15.5)
WBC Count: 2.2 10*3/uL — ABNORMAL LOW (ref 4.0–10.5)
nRBC: 0 % (ref 0.0–0.2)

## 2019-04-06 LAB — TOTAL PROTEIN, URINE DIPSTICK: Protein, ur: NEGATIVE mg/dL

## 2019-04-06 MED ORDER — SODIUM CHLORIDE 0.9 % IV SOLN
1200.0000 mg | Freq: Once | INTRAVENOUS | Status: AC
  Administered 2019-04-06: 12:00:00 1200 mg via INTRAVENOUS
  Filled 2019-04-06: qty 20

## 2019-04-06 MED ORDER — SODIUM CHLORIDE 0.9 % IV SOLN
14.8000 mg/kg | Freq: Once | INTRAVENOUS | Status: AC
  Administered 2019-04-06: 13:00:00 1100 mg via INTRAVENOUS
  Filled 2019-04-06: qty 32

## 2019-04-06 MED ORDER — SODIUM CHLORIDE 0.9 % IV SOLN
Freq: Once | INTRAVENOUS | Status: AC
  Administered 2019-04-06: 12:00:00 via INTRAVENOUS
  Filled 2019-04-06: qty 250

## 2019-04-06 NOTE — Patient Instructions (Signed)
Coronavirus (COVID-19) Are you at risk?  Are you at risk for the Coronavirus (COVID-19)?  To be considered HIGH RISK for Coronavirus (COVID-19), you have to meet the following criteria:  . Traveled to [place], [place], [place], [place] or [place]; or in the [place] to [place], [place], [place], or [place]; and have fever, cough, and shortness of breath within the last 2 weeks of travel OR . Been in close contact with a person diagnosed with COVID-19 within the last 2 weeks and have fever, cough, and shortness of breath . IF YOU DO NOT MEET THESE CRITERIA, YOU ARE CONSIDERED LOW RISK FOR COVID-19.  What to do if you are HIGH RISK for COVID-19?  Marland Kitchen If you are having a medical emergency, call 911. . Seek medical care right away. Before you go to a doctor's office, urgent care or emergency department, call ahead and tell them about your recent travel, contact with someone diagnosed with COVID-19, and your symptoms. You should receive instructions from your physician's office regarding next steps of care.  . When you arrive at healthcare provider, tell the healthcare staff immediately you have returned from visiting [place], [place], [place], [place] or [place]; or traveled in the [place] to [place], [place], [place], or [place]; in the last two weeks or you have been in close contact with a person diagnosed with COVID-19 in the last 2 weeks.   . Tell the health care staff about your symptoms: fever, cough and shortness of breath. . After you have been seen by a medical provider, you will be either: o Tested for (COVID-19) and discharged home on quarantine except to seek medical care if symptoms worsen, and asked to  - Stay home and avoid contact with others until you get your results (4-5 days)  - Avoid travel on public transportation if possible (such as bus, train, or airplane) or o Sent to the Emergency Department by EMS for evaluation, COVID-19 testing, and possible  admission depending on your condition and test results.  What to do if you are LOW RISK for COVID-19?  Reduce your risk of any infection by using the same precautions used for avoiding the common cold or flu:  Marland Kitchen Wash your hands often with soap and warm water for at least 20 seconds.  If soap and water are not readily available, use an alcohol-based hand sanitizer with at least 60% alcohol.  . If coughing or sneezing, cover your mouth and nose by coughing or sneezing into the elbow areas of your shirt or coat, into a tissue or into your sleeve (not your hands). . Avoid shaking hands with others and consider head nods or verbal greetings only. . Avoid touching your eyes, nose, or mouth with unwashed hands.  . Avoid close contact with people who are sick. . Avoid places or events with large numbers of people in one location, like concerts or sporting events. . Carefully consider travel plans you have or are making. . If you are planning any travel outside or inside the [place], visit the CDC's Travelers' Health webpage for the latest health notices. . If you have some symptoms but not all symptoms, continue to monitor at home and seek medical attention if your symptoms worsen. . If you are having a medical emergency, call 911.   [place] / e-Visit: [email or web address]         [facility]: [place]  Urgent Care: [facility]: [place] Discharge Instructions for Patients Receiving Chemotherapy  Today you received the following chemotherapy agents Tecentriq and Avastin   To help prevent nausea and vomiting after your treatment, we encourage you to take your nausea medication as directed.    If you develop nausea and vomiting that is not controlled by your nausea medication, call the clinic.    BELOW ARE SYMPTOMS THAT SHOULD BE REPORTED IMMEDIATELY:  *FEVER GREATER THAN 100.5 F  *CHILLS WITH OR WITHOUT FEVER  NAUSEA AND VOMITING THAT IS NOT CONTROLLED WITH YOUR NAUSEA MEDICATION  *UNUSUAL SHORTNESS OF BREATH  *UNUSUAL BRUISING OR BLEEDING  TENDERNESS IN MOUTH AND THROAT WITH OR WITHOUT PRESENCE OF ULCERS  *URINARY PROBLEMS  *BOWEL PROBLEMS  UNUSUAL RASH Items with * indicate a potential emergency and should be followed up as soon as possible.  Feel free to call the clinic should you have any questions or concerns. The clinic phone number is [phone].  Please show the [place] at check-in to the Emergency Department and triage nurse.

## 2019-04-06 NOTE — Progress Notes (Signed)
04/06/19  [name] had >10% weight loss received ok to adjust new dose of bevacizumab-awwb (Mvasi) to 1100 mg (15 mg/kg)  T.O. Dr Lavonda Jumbo, PharmD

## 2019-04-06 NOTE — Telephone Encounter (Signed)
Scheduled appt per 9/3 los.

## 2019-04-06 NOTE — Progress Notes (Signed)
Per Dr. Burr Medico okay to treat with ANC 1.4 today.

## 2019-04-07 LAB — AFP TUMOR MARKER: AFP, Serum, Tumor Marker: 28.9 ng/mL — ABNORMAL HIGH (ref 0.0–8.3)

## 2019-04-12 ENCOUNTER — Encounter (HOSPITAL_COMMUNITY): Payer: Self-pay

## 2019-04-12 ENCOUNTER — Encounter (HOSPITAL_COMMUNITY): Payer: Self-pay | Admitting: *Deleted

## 2019-04-17 ENCOUNTER — Other Ambulatory Visit: Payer: Self-pay | Admitting: Nurse Practitioner

## 2019-04-17 ENCOUNTER — Telehealth: Payer: Self-pay

## 2019-04-17 MED ORDER — OXYCODONE HCL 5 MG PO TABS: 5.0000 mg | ORAL_TABLET | Freq: Three times a day (TID) | ORAL | 0 refills | Status: DC | PRN

## 2019-04-17 NOTE — Telephone Encounter (Signed)
Patient calls for refill on Oxycodone 5 mg tablets, last filled 8/14 #90.

## 2019-04-17 NOTE — Telephone Encounter (Signed)
I refilled for him. Thanks, Regan Rakers

## 2019-04-18 ENCOUNTER — Ambulatory Visit: Payer: Medicare Other | Admitting: Gastroenterology

## 2019-04-18 ENCOUNTER — Other Ambulatory Visit: Payer: Self-pay | Admitting: *Deleted

## 2019-04-18 DIAGNOSIS — R112 Nausea with vomiting, unspecified: Secondary | ICD-10-CM

## 2019-04-24 ENCOUNTER — Other Ambulatory Visit: Payer: Self-pay

## 2019-04-24 NOTE — Progress Notes (Signed)
[name]   [phone] [phone]   Clinic Follow up Note   Patient Care Team: Lanae Boast, FNP as PCP - General (Family Medicine) Comer, Okey Regal, MD as Consulting Physician (Infectious Diseases) Truitt Merle, MD as Consulting Physician (Hematology)  Date of Service:  04/27/2019  CHIEF COMPLAINT: F/u of [name]  SUMMARY OF ONCOLOGIC HISTORY: Oncology History  Hepatocellular carcinoma ([place])  12/07/2017 Initial Diagnosis   Hepatocellular carcinoma ([place])   12/07/2017 Imaging   US Abdomen 12/07/17 IMPRESSION: ULTRASOUND ABDOMEN: Probable 4 mm gallstone in gallbladder.  Multiple hepatic masses largest heterogeneous RIGHT lobe measuring 6.1 x 5.2 x 5.5 cm; metastatic disease and primary tumor not excluded, recommend further evaluation by MR imaging.  ULTRASOUND HEPATIC ELASTOGRAPHY:  Median hepatic shear wave velocity is calculated at 1.52 m/sec.  Corresponding Metavir fibrosis score is F2 + some F3.  Risk of fibrosis is moderate.  Follow-up: Additional testing appropriate   12/22/2017 PET scan   PET 12/22/17  IMPRESSION: 1. Lesions within the liver have low metabolic activity for size. 2. Dominant lesion in liver does have a focus of metabolic activity which corresponds to a low-density rounded region within the larger lesion. Target this 2 cm focus within the dominant lesion for biopsy. The liver lesions remain concerning for metastatic disease or primary liver carcinoma. Mucinous or cystic carcinomas can have low metabolic activity. 3. Focus of metabolic activity at the level of the hepatic flexure of the colon with some soft tissue thickening not clearly localized. Some additional foci of uptake within colon. This could represent physiologic activity however depending on the biopsy results of the liver recommend colonoscopy.   12/23/2017 Cancer Staging   Staging form: Liver, AJCC 8th Edition - Clinical stage from 12/23/2017: Stage  IIIA (cT3, cN0, cM0) - Signed by Truitt Merle, MD on 12/30/2017   12/23/2017 Initial Biopsy   Diagnosis 12/23/17 Liver, needle/core biopsy, Right lobe - HEPATOCELLULAR CARCINOMA. Microscopic Comment Dr. Lyndon Code has reviewed the case. Dr. Burr Medico was paged on 12/24/2017.     02/18/2018 Imaging   CT AP W WO Contrast 02/18/18  IMPRESSION: 1. Multifocal hepatocellular carcinoma, mildly progressive from baseline MRI 12/10/2017. 2. No evidence of extrahepatic nodal metastases or vascular involvement. 3. Borderline hepatic steatosis. No morphologic changes of underlying cirrhosis or portal hypertension. 4. Cholelithiasis. 5. Mild Aortic Atherosclerosis (ICD10-I70.0).   03/24/2018 Procedure   TACE Chemo ebolization on 03/24/18 with Dr. Kathlene Cote    06/14/2018 Imaging   MRI abdomen 06/14/18  IMPRESSION: 1. Dominant lesion in the central LEFT hepatic lobe (segment 4A) is stable in size, decreased in central enhancement, and with persistent nodular wall enhancement. 2. Multiple additional smaller lesions within LEFT and RIGHT hepatic lobe are increased in size. 3. Potential new lesions on the early arterial phase imaging.   08/16/2018 Imaging   CT Chest 08/16/18   IMPRESSION: Multifocal hepatic masses in this patient with known liver cancer, similar to prior MRI, although poorly evaluated.  No evidence of metastatic disease in the chest.  Aortic Atherosclerosis (ICD10-I70.0) and Emphysema (ICD10-J43.9).   09/13/2018 Procedure   Y90 treatment with Dr. Kathlene Cote on 09/13/18 and 01/09/19   01/29/2019 Imaging    CT AP WO contrast 01/29/19 IMPRESSION: 1. No acute findings within the abdomen or pelvis. 2. Mild decrease in multiple liver masses compared to previous MRI.   02/09/2019 Imaging   MRI Abdomen  IMPRESSION: 1. Today's study demonstrates a mixed response to therapy. Overall, most lesions are decreased in size compared to the  prior examination, with the exception of 2 new lesions in the  liver, as detailed above. Findings remain most compatible with multifocal hepatocellular carcinoma. 2. Hepatic steatosis and hepatic cirrhosis.   02/15/2019 -  Chemotherapy   atezolizumab (Tecentriq) and bevacizumab (Avastin) q3weeks starting with cycle 1 Tecentriq only starting 02/15/19, added Avastin with cycle 2.    03/02/2019 Procedure   Upper Endoscopy by Dr. Tarri Glenn 03/02/19  IMPRESSION - Irregular z-line suspicious for Barrett's esophagus. - Non-bleeding erosive gastropathy and suspected gastritis. Biopsied. - Small hiatal hernia. - Normal examined duodenum. - No varices or portal hypertensive gastropathy present. 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 - THERE IS NO EVIDENCE OF DYSPLASIA OR MALIGNANCY. - SEE COMMENT.      CURRENT THERAPY:  atezolizumab(Tecentriq)and bevacizumab (Avastin) q3weeks starting with cycle 1 Tecentriq onlyon 02/15/19.AddedAvastin with cycle 2.  INTERVAL HISTORY:  [name] is here for a follow up and treatment. He presents to the clinic alone. He called his wife to be included in the visit today. He notes recent GI bleeding mildly. He denies constipation but does note having hemorrhoids. He does note mild pain with BMs, but not just sitting still. He is willing to try rectal exam but is concerned with pain. He notes He takes 57m oxycodone q8hours. He does still have upper abdominal pain. He feels he is eating adequately but not gaining weight. His wife notes his PCP recommended him to take 3 ensure a day. He would need a prescription to better afford it.  He has been taking Flonase and allergy medication for his clear nasal discharge. He denies any fever. He does not he has been short of breath lately. No chest pain or pressure.     REVIEW OF SYSTEMS:   Constitutional: Denies fevers, chills or abnormal weight loss Eyes: Denies blurriness of vision Ears, nose,  mouth, throat, and face: Denies mucositis or sore throat (+) clear nasal discharge  Respiratory: Denies cough, wheezes (+) Mild SOB  Cardiovascular: Denies palpitation, chest discomfort or lower extremity swelling Gastrointestinal:  Denies nausea, heartburn or change in bowel habits (+) Mild GI bleeding (+) Hemorrhoids (+) Upper abdominal pain  Skin: Denies abnormal skin rashes Lymphatics: Denies new lymphadenopathy or easy bruising Neurological:Denies numbness, tingling or new weaknesses Behavioral/Psych: Mood is stable, no new changes  All other systems were reviewed with the patient and are negative.  MEDICAL HISTORY:  Past Medical History:  Diagnosis Date  . [place]   liver cancer  . Chest pain    pt states when he turned over on other side aided in relief  . Diabetes mellitus   . Dyspnea    increased activity  . Elevated cholesterol   . Hepatitis C   . Hypertension     SURGICAL HISTORY: Past Surgical History:  Procedure Laterality Date  . I&D EXTREMITY  06/28/2011   Procedure: IRRIGATION AND DEBRIDEMENT EXTREMITY;  Surgeon: FLinna Hoff  [facility] [facility]  Service: Orthopedics;  Laterality: Left;  with application of wound vac  . INCISION AND DRAINAGE OF WOUND  07/02/2011   Procedure: IRRIGATION AND DEBRIDEMENT WOUND;  Surgeon: FLinna Hoff  [place] [facility]  Service: Orthopedics;  Laterality: Left;  Irrigation and Debridement of left arm wound with  Skin Grafting from left thigh   . IR ANGIOGRAM SELECTIVE EACH ADDITIONAL VESSEL  03/18/2018  . IR ANGIOGRAM SELECTIVE EACH ADDITIONAL VESSEL  08/26/2018  . [place]  ADDITIONAL VESSEL  08/26/2018  . IR ANGIOGRAM SELECTIVE EACH ADDITIONAL VESSEL  08/26/2018  . IR ANGIOGRAM SELECTIVE EACH ADDITIONAL VESSEL  08/26/2018  . IR ANGIOGRAM SELECTIVE EACH ADDITIONAL VESSEL  09/13/2018  . IR ANGIOGRAM SELECTIVE EACH ADDITIONAL VESSEL  01/09/2019  . IR ANGIOGRAM VISCERAL SELECTIVE  03/18/2018  . IR ANGIOGRAM  VISCERAL SELECTIVE  03/18/2018  . IR ANGIOGRAM VISCERAL SELECTIVE  08/26/2018  . IR ANGIOGRAM VISCERAL SELECTIVE  08/26/2018  . IR ANGIOGRAM VISCERAL SELECTIVE  09/13/2018  . IR ANGIOGRAM VISCERAL SELECTIVE  01/09/2019  . IR EMBO ARTERIAL NOT HEMORR HEMANG INC GUIDE ROADMAPPING  08/26/2018  . IR EMBO TUMOR ORGAN ISCHEMIA INFARCT INC GUIDE ROADMAPPING  03/18/2018  . IR EMBO TUMOR ORGAN ISCHEMIA INFARCT INC GUIDE ROADMAPPING  09/13/2018  . IR EMBO TUMOR ORGAN ISCHEMIA INFARCT INC GUIDE ROADMAPPING  01/09/2019  . IR RADIOLOGIST EVAL & MGMT  02/16/2018  . IR RADIOLOGIST EVAL & MGMT  07/12/2018  . IR US GUIDE VASC ACCESS RIGHT  03/18/2018  . IR US GUIDE VASC ACCESS RIGHT  08/26/2018  . IR US GUIDE VASC ACCESS RIGHT  09/13/2018  . IR US GUIDE VASC ACCESS RIGHT  01/09/2019  . JOINT REPLACEMENT    . PARTIAL HIP ARTHROPLASTY      I have reviewed the social history and family history with the patient and they are unchanged from previous note.  ALLERGIES:  is allergic to gadolinium derivatives and multihance [gadobenate].  MEDICATIONS:  Current Outpatient Medications  Medication Sig Dispense Refill  . cyclobenzaprine (FLEXERIL) 10 MG tablet Take 1/2 to 1 whole tablet by mouth every 8 hours as needed for muscle spasms. 15 tablet 0  . diclofenac (VOLTAREN) 75 MG EC tablet Take 1 tablet (75 mg total) by mouth 2 (two) times daily as needed for moderate pain. 30 tablet 0  . gabapentin (NEURONTIN) 300 MG capsule Take 1 capsule (300 mg total) by mouth 3 (three) times daily. 270 capsule 1  . glipiZIDE (GLUCOTROL) 10 MG tablet TAKE 1 TABLET(10 MG) BY MOUTH TWICE DAILY BEFORE A MEAL 180 tablet 2  . glucose blood test strip Check blood sugar 3 times per day prior to meals. Use as instructed 100 each 12  . hydrochlorothiazide (HYDRODIURIL) 25 MG tablet TAKE 1 TABLET(25 MG) BY MOUTH DAILY (Patient taking differently: Take 25 mg by mouth daily. ) 90 tablet 1  . insulin glargine (LANTUS) 100 UNIT/ML injection Inject 0.3 mLs  (30 Units total) into the skin at bedtime. 10 mL 11  . Insulin Pen Needle (PEN NEEDLES) 32G X 4 MM MISC 1 packet by Does not apply route 3 (three) times daily as needed. 100 each 2  . lisinopril (PRINIVIL,ZESTRIL) 20 MG tablet Take 1 tablet (20 mg total) by mouth daily. 90 tablet 1  . metFORMIN (GLUCOPHAGE) 1000 MG tablet TAKE 1 TABLET(1000 MG) BY MOUTH TWICE DAILY 180 tablet 0  . ondansetron (ZOFRAN) 4 MG tablet TAKE 1 TABLET(4 MG) BY MOUTH EVERY 8 HOURS AS NEEDED FOR NAUSEA OR VOMITING 30 tablet 0  . oxyCODONE (OXY IR/ROXICODONE) 5 MG immediate release tablet Take 1 tablet (5 mg total) by mouth every 8 (eight) hours as needed for severe pain. 90 tablet 0  . pantoprazole (PROTONIX) 40 MG tablet Pantoprazole 40 mg every morning for 8 weeks. 90 tablet 0  . pravastatin (PRAVACHOL) 80 MG tablet Take 1 tablet (80 mg total) by mouth daily. 90 tablet 1  . predniSONE (DELTASONE) 50 MG tablet Take Prednisone 50 mg  13 hr,  7 hr,  And  1 hr before MRI scan  As directed. 3 tablet 0  . prochlorperazine (COMPAZINE) 10 MG tablet Take 1 tablet (10 mg total) by mouth every 6 (six) hours as needed for nausea or vomiting. 30 tablet 1  . senna (SENOKOT) 8.6 MG tablet Take 1 tablet by mouth as needed for constipation.    . Sofosbuvir-Velpatasvir (EPCLUSA) 400-100 MG TABS Take 1 tablet by mouth daily. 28 tablet 2  . Syringe, Disposable, (B-D 30CC SYRINGE) 30 ML MISC 1 Syringe by Does not apply route as directed. 50 each 5  . Glucerna (GLUCERNA) LIQD Take 1 Can by mouth 3 (three) times daily between meals. 21330 mL 3   No current facility-administered medications for this visit.    Facility-Administered Medications Ordered in Other Visits  Medication Dose Route Frequency Provider Last Rate Last Dose  . bevacizumab-awwb (MVASI) 1,000 mg in sodium chloride 0.9 % 100 mL chemo infusion  1,000 mg Intravenous Once Truitt Merle, MD 280 mL/hr at 04/27/19 1338 1,000 mg at 04/27/19 1338    PHYSICAL EXAMINATION: ECOG  PERFORMANCE STATUS: 2 - Symptomatic, <50% confined to bed  Vitals:   04/27/19 1106  BP: (!) 140/94  Pulse: (!) 109  Resp: 16  Temp: 98.2 F (36.8 C)  SpO2: 99%   Filed Weights   04/27/19 1106  Weight: 146 lb 3.2 oz (66.3 kg)    GENERAL:alert, no distress and comfortable SKIN: skin color, texture, turgor are normal, no rashes or significant lesions EYES: normal, Conjunctiva are pink and non-injected, sclera clear  NECK: supple, thyroid normal size, non-tender, without nodularity LYMPH:  no palpable lymphadenopathy in the cervical, axillary  LUNGS: clear to auscultation and percussion with normal breathing effort HEART: regular rate & rhythm and no murmurs and no lower extremity edema ABDOMEN:abdomen soft, non-tender and normal bowel sounds (+) Right abdominal tenderness (+) Hepatomegaly Musculoskeletal:no cyanosis of digits and no clubbing  NEURO: alert & oriented x 3 with fluent speech, no focal motor/sensory deficits Rectal exam: (+) small external hemorrhoids. (+) possible internal hemorrhoids No blood on glove, no palpable mass. Benign exam    LABORATORY DATA:  I have reviewed the data as listed CBC Latest Ref Rng & Units 04/27/2019 04/06/2019 03/17/2019  WBC 4.0 - 10.5 K/uL 3.2(L) 2.2(L) 3.8(L)  Hemoglobin 13.0 - 17.0 g/dL 15.7 13.8 13.5  Hematocrit 39.0 - 52.0 % 47.6 41.0 40.3  Platelets 150 - 400 K/uL 226 283 220     CMP Latest Ref Rng & Units 04/27/2019 04/06/2019 03/17/2019  Glucose 70 - 99 mg/dL 259(H) 231(H) 136(H)  BUN 6 - 20 mg/dL 14 11 20   Creatinine 0.61 - 1.24 mg/dL 1.15 0.98 1.28(H)  Sodium 135 - 145 mmol/L 138 135 135  Potassium 3.5 - 5.1 mmol/L 4.6 4.1 4.8  Chloride 98 - 111 mmol/L 103 101 102  CO2 22 - 32 mmol/L 27 27 21(L)  Calcium 8.9 - 10.3 mg/dL 9.6 9.3 9.6  Total Protein 6.5 - 8.1 g/dL 7.5 6.9 7.3  Total Bilirubin 0.3 - 1.2 mg/dL 0.9 1.1 1.9(H)  Alkaline Phos 38 - 126 U/L 127(H) 110 79  AST 15 - 41 U/L 58(H) 72(H) 71(H)  ALT 0 - 44 U/L 43 48(H)  63(H)      RADIOGRAPHIC STUDIES: I have personally reviewed the radiological images as listed and agreed with the findings in the report. No results found.   ASSESSMENT & PLAN:  [name] is a 53 y.o. male with    1.Hepatocellular  carcinoma,stage IIIA (cT3N0M0) -He was diagnosed in 12/2017. He is s/p TACE Chemo embolization on 03/24/18 with Dr. Kathlene Cote.  -His repeat MRI in 06/14/18 showed disease progression in liver, except the treated lesion in 4A.No extrahepatic disease on the recent abdominal MRI. We discussed that his liver cancer is not curable at this stage, andgoal of therapy is to control disease andprolong his life. -He underwent Y90 treatmentson2/11/20 and 01/09/19.  -After latest Y90, he has worsening RUQ abdominal pain. He has hepatomegaly with significant tenderness on prior exam.  -Recent CT AP and MRI from June and July showed his previous treated liver lesions have much improved but now has 2 new liver lesions. Overall disease progression.  -I started him on atezolizumab (Tecentriq) and bevacizumab (Avastin) q3weeks. Started cycle 1 with Tecentriq only on 02/15/19 andaddAvastinwith cycle 2. His recent EGD showed no significant varices -He has been mostly stable but has had recent mild GI bleeding, pain with BMs, clear nasal discharge from allergies, and mild SOB. Will monitor and I recommend he f/u with Dr Tarri Glenn about another colonoscopy.  May hold Avastin as needed in the future.  -Physical exam today shows hepatomegaly with tenderness and hemorrhoids which is the likely source of bleeding.  -Labs reviewed, CBC and CMP WNL except WBC 3.2, BG 259, albumin 3.3, AST 58, Alk Phos 127. AFP still pending. Overall adequate to proceed with C4 Tecentriq and Avastin today.  -Repeat CT scan in 3 weeks. Will give #3 tabs of prednisone for scan due to contrast allergy. I reviewed this can increase his BG. He should monitor his sugar and reduce his sugar intake that week.   -F/u in 3 weeks   2. Abdominal pain, nausea and constipation -Tramadol is no longer controlling his pain  -On 02/09/19 I increased hydrocodone to q8hours to better control pain. He can use Tylenol or ibuprofen for mild pain. -Latest CT and MRI showed his worsening pain is likely related to his new liver lesions. -Per patient he notes he has been taking Oxycodone BID which was left over and Hydrocodone BID at the same time which is managing his pain.  -To consolidate his pain medication, I will no longer refill hydrocodone and he will proceed with oxycodone 42m q8hours 1 month course (03/17/19).  -I again discussed this is narcotics an should only be taken for severe pain as this can causes dependence. He understands. -I also encouraged him to take Gabapentin at night as this can cause drowsiness but will help with his nerve pain.  -His pain has improved and controlled on 527moxycodone q8hours.    3. Hepatitis C, untreated, H. Pylorie -He was diagnosed with Hepatitis Cin 09/2017, however he is unsure of how long he has had hepatitis C prior to his diagnosis.  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 -Dr. CoBecky Augustaot recommendtreatment until Cancer is controlled. -I encouraged the patient to maintain follow up with Dr. CoLinus Salmons-No significant liver cirrhosis on MRI. 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 -I advised the patient to maintain follow up with his PCP -BPpreviously normal but elevated at 140/90 today (04/27/19) -I encouraged him to reduce sugar in diet especially when using prednisone for scans.  -I gave prescription for Glucerna  to help him gain weight  (04/27/19)  5. Alcohol and smoking cessation -I advised and recommended with the patient that he does discontinue use of ETOH due to the patient already with multiple liver issues.  -He does smoke cigarettes and he has a 40 year history.Irepeatedlyadvised that the patient discontinue use of smoking cigarettes at this time due to the increase risk of lung cancer. -He has reduced smoking to a few cigarettes a day. He has mild SOB and cough. I again encouraged him to completely quit.  -He reduced drinking to beer occasionally. I again recommended complete cessation. He understands.  6. Goal of care discussion  -The patient understands the goal of care is palliative. -he is full code   7. Rectal Bleeding  -He has been having mild low GI bleeding recently.  -He has not had a colonoscopy. If his bleeding does not resolve I highly recommend he see Dr. Modena Nunnery for procedure. May hold Avastin as needed.  -I encouraged him to avoid NSAIDs. I also discussed Avastin can contribute to bleeding. Will monitor.  -Rectal exam today shows internal and external hemorrhoids. I encouraged him to use SITS bath and cream.    Plan -I gave prescription for Glucerna today  -I called in #3 tabs of prednisone today for CT contrast allergy  -Send message to Dr. Tarri Glenn for colonoscopy  -Lab reviewed and adequate to proceed with Tecentriq and Avastin today -Lab, f/u, Tecentriqand Avastinin 3 weeks  -CT AP W Contrast in 3 weeks.    No problem-specific Assessment & Plan notes found for this encounter.   Orders Placed This Encounter  Procedures  . CT Abdomen Pelvis W Contrast    Pt has contrast allergy, will take prednisone and benadryl    Standing Status:   Future    Standing Expiration Date:   04/26/2020    Order Specific Question:   If indicated for the ordered procedure, I authorize the administration of contrast media per Radiology protocol    Answer:   Yes    Order Specific Question:    Preferred imaging location?    Answer:   [facility]    Order Specific Question:   Is Oral Contrast requested for this exam?    Answer:   Yes, Per Radiology protocol    Order Specific Question:   Radiology Contrast Protocol - do NOT remove file path    Answer:   [email or web address]   All questions were answered. The patient knows to call the clinic with any problems, questions or concerns. No barriers to learning was detected. I spent 20 minutes counseling the patient face to face. The total time spent in the appointment was 25 minutes and more than 50% was on counseling and review of test results     Truitt Merle, MD 04/27/2019   I, Joslyn Devon, am acting as scribe for Truitt Merle, MD.   I have reviewed the above documentation for accuracy and completeness, and I agree with the above.

## 2019-04-24 NOTE — Patient Outreach (Signed)
[facility]) Care Management  04/24/2019  Alcides Nutting Hinson [birth year] [phone]   Medication Adherence call to Mr. Ferrum Compliant Voice message left with a call back number. Mr. Deskins is showing pats due on Lisinopril 20 mg under [place].   [place] Management Direct Dial [phone]  Fax [phone] [email or web address]

## 2019-04-25 DIAGNOSIS — C220 Liver cell carcinoma: Secondary | ICD-10-CM | POA: Diagnosis not present

## 2019-04-25 DIAGNOSIS — E46 Unspecified protein-calorie malnutrition: Secondary | ICD-10-CM | POA: Diagnosis not present

## 2019-04-25 DIAGNOSIS — Z0000 Encounter for general adult medical examination without abnormal findings: Secondary | ICD-10-CM | POA: Diagnosis not present

## 2019-04-25 DIAGNOSIS — J309 Allergic rhinitis, unspecified: Secondary | ICD-10-CM | POA: Diagnosis not present

## 2019-04-25 DIAGNOSIS — E119 Type 2 diabetes mellitus without complications: Secondary | ICD-10-CM | POA: Diagnosis not present

## 2019-04-27 ENCOUNTER — Inpatient Hospital Stay: Payer: Medicare Other

## 2019-04-27 ENCOUNTER — Inpatient Hospital Stay (HOSPITAL_BASED_OUTPATIENT_CLINIC_OR_DEPARTMENT_OTHER): Payer: Medicare Other | Admitting: Hematology

## 2019-04-27 ENCOUNTER — Other Ambulatory Visit: Payer: Self-pay

## 2019-04-27 ENCOUNTER — Encounter: Payer: Self-pay | Admitting: Hematology

## 2019-04-27 ENCOUNTER — Other Ambulatory Visit: Payer: Self-pay | Admitting: Hematology

## 2019-04-27 VITALS — BP 140/94 | HR 109 | Temp 98.2°F | Resp 16 | Ht 67.0 in | Wt 146.2 lb

## 2019-04-27 VITALS — BP 140/85 | HR 95

## 2019-04-27 DIAGNOSIS — C220 Liver cell carcinoma: Secondary | ICD-10-CM | POA: Diagnosis not present

## 2019-04-27 DIAGNOSIS — K7460 Unspecified cirrhosis of liver: Secondary | ICD-10-CM | POA: Diagnosis not present

## 2019-04-27 DIAGNOSIS — I10 Essential (primary) hypertension: Secondary | ICD-10-CM | POA: Diagnosis not present

## 2019-04-27 DIAGNOSIS — K922 Gastrointestinal hemorrhage, unspecified: Secondary | ICD-10-CM | POA: Diagnosis not present

## 2019-04-27 DIAGNOSIS — E119 Type 2 diabetes mellitus without complications: Secondary | ICD-10-CM | POA: Diagnosis not present

## 2019-04-27 DIAGNOSIS — Z7189 Other specified counseling: Secondary | ICD-10-CM

## 2019-04-27 DIAGNOSIS — Z791 Long term (current) use of non-steroidal anti-inflammatories (NSAID): Secondary | ICD-10-CM | POA: Diagnosis not present

## 2019-04-27 DIAGNOSIS — R1010 Upper abdominal pain, unspecified: Secondary | ICD-10-CM | POA: Diagnosis not present

## 2019-04-27 DIAGNOSIS — Z794 Long term (current) use of insulin: Secondary | ICD-10-CM

## 2019-04-27 DIAGNOSIS — K760 Fatty (change of) liver, not elsewhere classified: Secondary | ICD-10-CM | POA: Diagnosis not present

## 2019-04-27 DIAGNOSIS — I700 Atherosclerosis of aorta: Secondary | ICD-10-CM | POA: Diagnosis not present

## 2019-04-27 DIAGNOSIS — E7800 Pure hypercholesterolemia, unspecified: Secondary | ICD-10-CM | POA: Diagnosis not present

## 2019-04-27 DIAGNOSIS — Z5112 Encounter for antineoplastic immunotherapy: Secondary | ICD-10-CM | POA: Diagnosis not present

## 2019-04-27 DIAGNOSIS — Z79899 Other long term (current) drug therapy: Secondary | ICD-10-CM | POA: Diagnosis not present

## 2019-04-27 DIAGNOSIS — Z9221 Personal history of antineoplastic chemotherapy: Secondary | ICD-10-CM | POA: Diagnosis not present

## 2019-04-27 LAB — CMP (CANCER CENTER ONLY)
ALT: 43 U/L (ref 0–44)
AST: 58 U/L — ABNORMAL HIGH (ref 15–41)
Albumin: 3.3 g/dL — ABNORMAL LOW (ref 3.5–5.0)
Alkaline Phosphatase: 127 U/L — ABNORMAL HIGH (ref 38–126)
Anion gap: 8 (ref 5–15)
BUN: 14 mg/dL (ref 6–20)
CO2: 27 mmol/L (ref 22–32)
Calcium: 9.6 mg/dL (ref 8.9–10.3)
Chloride: 103 mmol/L (ref 98–111)
Creatinine: 1.15 mg/dL (ref 0.61–1.24)
GFR, Est AFR Am: >60 mL/min (ref >60)
GFR, Estimated: >60 mL/min (ref >60)
Glucose, Bld: 259 mg/dL — ABNORMAL HIGH (ref 70–99)
Potassium: 4.6 mmol/L (ref 3.5–5.1)
Sodium: 138 mmol/L (ref 135–145)
Total Bilirubin: 0.9 mg/dL (ref 0.3–1.2)
Total Protein: 7.5 g/dL (ref 6.5–8.1)

## 2019-04-27 LAB — CBC WITH DIFFERENTIAL (CANCER CENTER ONLY)
Abs Immature Granulocytes: 0.01 10*3/uL (ref 0.00–0.07)
Basophils Absolute: 0 10*3/uL (ref 0.0–0.1)
Basophils Relative: 1 %
Eosinophils Absolute: 0.1 10*3/uL (ref 0.0–0.5)
Eosinophils Relative: 2 %
HCT: 47.6 % (ref 39.0–52.0)
Hemoglobin: 15.7 g/dL (ref 13.0–17.0)
Immature Granulocytes: 0 %
Lymphocytes Relative: 15 %
Lymphs Abs: 0.5 10*3/uL — ABNORMAL LOW (ref 0.7–4.0)
MCH: 29.3 pg (ref 26.0–34.0)
MCHC: 33 g/dL (ref 30.0–36.0)
MCV: 88.8 fL (ref 80.0–100.0)
Monocytes Absolute: 0.4 10*3/uL (ref 0.1–1.0)
Monocytes Relative: 13 %
Neutro Abs: 2.2 10*3/uL (ref 1.7–7.7)
Neutrophils Relative %: 69 %
Platelet Count: 226 10*3/uL (ref 150–400)
RBC: 5.36 MIL/uL (ref 4.22–5.81)
RDW: 13 % (ref 11.5–15.5)
WBC Count: 3.2 10*3/uL — ABNORMAL LOW (ref 4.0–10.5)
nRBC: 0 % (ref 0.0–0.2)

## 2019-04-27 MED ORDER — GLUCERNA PO LIQD: 1.0000 | Freq: Three times a day (TID) | ORAL | 3 refills | Status: AC

## 2019-04-27 MED ORDER — SODIUM CHLORIDE 0.9 % IV SOLN
1000.0000 mg | Freq: Once | INTRAVENOUS | Status: AC
  Administered 2019-04-27: 14:00:00 1000 mg via INTRAVENOUS
  Filled 2019-04-27: qty 32

## 2019-04-27 MED ORDER — SODIUM CHLORIDE 0.9 % IV SOLN
Freq: Once | INTRAVENOUS | Status: AC
  Administered 2019-04-27: 12:00:00 via INTRAVENOUS
  Filled 2019-04-27: qty 250

## 2019-04-27 MED ORDER — PREDNISONE 50 MG PO TABS: ORAL_TABLET | ORAL | 0 refills | Status: DC

## 2019-04-27 MED ORDER — SODIUM CHLORIDE 0.9 % IV SOLN
1200.0000 mg | Freq: Once | INTRAVENOUS | Status: AC
  Administered 2019-04-27: 1200 mg via INTRAVENOUS
  Filled 2019-04-27: qty 20

## 2019-04-27 NOTE — Progress Notes (Signed)
Due to patient weight loss > 10% from original treatment plan, have updated Mvasi dosing of 15 mg/kg to reflect most recent weight (66.3 kg). Future cycles updated as well per discussion with Dr. Burr Medico.  Leron Croak, PharmD PGY2 Hematology/Oncology Pharmacy Resident 04/27/2019 12:21 PM

## 2019-04-27 NOTE — Patient Instructions (Signed)
[facility] Discharge Instructions for Patients Receiving Chemotherapy  Today you received the following chemotherapy agents: MVASI/Tecentriq  To help prevent nausea and vomiting after your treatment, we encourage you to take your nausea medication as directed.   If you develop nausea and vomiting that is not controlled by your nausea medication, call the clinic.   BELOW ARE SYMPTOMS THAT SHOULD BE REPORTED IMMEDIATELY:  *FEVER GREATER THAN 100.5 F  *CHILLS WITH OR WITHOUT FEVER  NAUSEA AND VOMITING THAT IS NOT CONTROLLED WITH YOUR NAUSEA MEDICATION  *UNUSUAL SHORTNESS OF BREATH  *UNUSUAL BRUISING OR BLEEDING  TENDERNESS IN MOUTH AND THROAT WITH OR WITHOUT PRESENCE OF ULCERS  *URINARY PROBLEMS  *BOWEL PROBLEMS  UNUSUAL RASH Items with * indicate a potential emergency and should be followed up as soon as possible.  Feel free to call the clinic should you have any questions or concerns. The clinic phone number is [phone].  Please show the [place] at check-in to the Emergency Department and triage nurse.

## 2019-04-28 ENCOUNTER — Telehealth: Payer: Self-pay | Admitting: Hematology

## 2019-04-28 LAB — AFP TUMOR MARKER: AFP, Serum, Tumor Marker: 24.2 ng/mL — ABNORMAL HIGH (ref 0.0–8.3)

## 2019-04-28 NOTE — Telephone Encounter (Signed)
Per 9/24 los CTAP W contrast in 2-3 weeks

## 2019-05-02 ENCOUNTER — Telehealth: Payer: Self-pay | Admitting: *Deleted

## 2019-05-02 NOTE — Telephone Encounter (Signed)
Left message for the patient to call back.   Patient needs to come in for H. Pylori testing. Lab orders already in Epic.

## 2019-05-05 ENCOUNTER — Other Ambulatory Visit: Payer: Self-pay

## 2019-05-05 ENCOUNTER — Telehealth: Payer: Self-pay

## 2019-05-05 DIAGNOSIS — C220 Liver cell carcinoma: Secondary | ICD-10-CM

## 2019-05-05 MED ORDER — PREDNISONE 50 MG PO TABS: ORAL_TABLET | ORAL | 0 refills | Status: DC

## 2019-05-05 NOTE — Telephone Encounter (Signed)
Left voice message for [name] (significant other), have sent in Prednisone 50 mg tablets #3 to take prior to scan for allergy one 13 hrs prior, 1 7 hours prior and 1 one hour prior along with Benadryl 50 mg tablet 1 hour prior.  He will need to pick up oral prep.  Drink one bottle at 9:30 and second bottle at 10:30 on 10/14 for scan at 11:30.  Also I have schedule a lab appointment for 10:15 on 10/14 prior to his scan and cancelled the one on 10/15.    Encouraged her to call back if she has questions.

## 2019-05-08 ENCOUNTER — Telehealth: Payer: Self-pay | Admitting: *Deleted

## 2019-05-08 NOTE — Telephone Encounter (Signed)
Called the patient to remind the patient to come in to be rechecked for h.pylori. Patient verbalized understanding and reported would be in this week.

## 2019-05-12 ENCOUNTER — Other Ambulatory Visit: Payer: Medicare Other

## 2019-05-12 DIAGNOSIS — R112 Nausea with vomiting, unspecified: Secondary | ICD-10-CM

## 2019-05-15 ENCOUNTER — Telehealth: Payer: Self-pay

## 2019-05-15 NOTE — Telephone Encounter (Signed)
Patient calls in for refill on Oxycodone, last filled on 09/14 #90  Routed to Cira Rue NP for refill.

## 2019-05-16 ENCOUNTER — Other Ambulatory Visit: Payer: Self-pay | Admitting: Nurse Practitioner

## 2019-05-16 ENCOUNTER — Encounter: Payer: Self-pay | Admitting: *Deleted

## 2019-05-16 LAB — H. PYLORI ANTIGEN, STOOL: H pylori Ag, Stl: NEGATIVE

## 2019-05-16 MED ORDER — OXYCODONE HCL 5 MG PO TABS: 5.0000 mg | ORAL_TABLET | Freq: Three times a day (TID) | ORAL | 0 refills | Status: DC | PRN

## 2019-05-17 ENCOUNTER — Inpatient Hospital Stay: Payer: Medicare Other | Attending: Hematology

## 2019-05-17 ENCOUNTER — Encounter (HOSPITAL_COMMUNITY): Payer: Self-pay

## 2019-05-17 ENCOUNTER — Other Ambulatory Visit: Payer: Self-pay

## 2019-05-17 ENCOUNTER — Ambulatory Visit (HOSPITAL_COMMUNITY)
Admission: RE | Admit: 2019-05-17 | Discharge: 2019-05-17 | Disposition: A | Discharge status: Home / Self Care | Payer: Medicare Other | Source: Ambulatory Visit | Attending: Hematology | Admitting: Hematology

## 2019-05-17 DIAGNOSIS — Z794 Long term (current) use of insulin: Secondary | ICD-10-CM | POA: Insufficient documentation

## 2019-05-17 DIAGNOSIS — Z9221 Personal history of antineoplastic chemotherapy: Secondary | ICD-10-CM | POA: Diagnosis not present

## 2019-05-17 DIAGNOSIS — C220 Liver cell carcinoma: Secondary | ICD-10-CM | POA: Insufficient documentation

## 2019-05-17 DIAGNOSIS — K921 Melena: Secondary | ICD-10-CM | POA: Diagnosis not present

## 2019-05-17 DIAGNOSIS — R197 Diarrhea, unspecified: Secondary | ICD-10-CM | POA: Diagnosis not present

## 2019-05-17 DIAGNOSIS — Z791 Long term (current) use of non-steroidal anti-inflammatories (NSAID): Secondary | ICD-10-CM | POA: Insufficient documentation

## 2019-05-17 DIAGNOSIS — J439 Emphysema, unspecified: Secondary | ICD-10-CM | POA: Insufficient documentation

## 2019-05-17 DIAGNOSIS — E7800 Pure hypercholesterolemia, unspecified: Secondary | ICD-10-CM | POA: Insufficient documentation

## 2019-05-17 DIAGNOSIS — I10 Essential (primary) hypertension: Secondary | ICD-10-CM | POA: Insufficient documentation

## 2019-05-17 DIAGNOSIS — F17210 Nicotine dependence, cigarettes, uncomplicated: Secondary | ICD-10-CM | POA: Diagnosis not present

## 2019-05-17 DIAGNOSIS — B1920 Unspecified viral hepatitis C without hepatic coma: Secondary | ICD-10-CM | POA: Insufficient documentation

## 2019-05-17 DIAGNOSIS — E119 Type 2 diabetes mellitus without complications: Secondary | ICD-10-CM | POA: Diagnosis not present

## 2019-05-17 DIAGNOSIS — Z79899 Other long term (current) drug therapy: Secondary | ICD-10-CM | POA: Diagnosis not present

## 2019-05-17 LAB — CBC WITH DIFFERENTIAL (CANCER CENTER ONLY)
Abs Immature Granulocytes: 0.01 10*3/uL (ref 0.00–0.07)
Basophils Absolute: 0 10*3/uL (ref 0.0–0.1)
Basophils Relative: 0 %
Eosinophils Absolute: 0 10*3/uL (ref 0.0–0.5)
Eosinophils Relative: 0 %
HCT: 48.8 % (ref 39.0–52.0)
Hemoglobin: 15.9 g/dL (ref 13.0–17.0)
Immature Granulocytes: 0 %
Lymphocytes Relative: 7 %
Lymphs Abs: 0.2 10*3/uL — ABNORMAL LOW (ref 0.7–4.0)
MCH: 29.4 pg (ref 26.0–34.0)
MCHC: 32.6 g/dL (ref 30.0–36.0)
MCV: 90.4 fL (ref 80.0–100.0)
Monocytes Absolute: 0.1 10*3/uL (ref 0.1–1.0)
Monocytes Relative: 1 %
Neutro Abs: 3.1 10*3/uL (ref 1.7–7.7)
Neutrophils Relative %: 92 %
Platelet Count: 221 10*3/uL (ref 150–400)
RBC: 5.4 MIL/uL (ref 4.22–5.81)
RDW: 13.7 % (ref 11.5–15.5)
WBC Count: 3.5 10*3/uL — ABNORMAL LOW (ref 4.0–10.5)
nRBC: 0 % (ref 0.0–0.2)

## 2019-05-17 LAB — CMP (CANCER CENTER ONLY)
ALT: 39 U/L (ref 0–44)
AST: 52 U/L — ABNORMAL HIGH (ref 15–41)
Albumin: 3.3 g/dL — ABNORMAL LOW (ref 3.5–5.0)
Alkaline Phosphatase: 135 U/L — ABNORMAL HIGH (ref 38–126)
Anion gap: 16 — ABNORMAL HIGH (ref 5–15)
BUN: 17 mg/dL (ref 6–20)
CO2: 23 mmol/L (ref 22–32)
Calcium: 9.9 mg/dL (ref 8.9–10.3)
Chloride: 103 mmol/L (ref 98–111)
Creatinine: 1.08 mg/dL (ref 0.61–1.24)
GFR, Est AFR Am: >60 mL/min (ref >60)
GFR, Estimated: >60 mL/min (ref >60)
Glucose, Bld: 222 mg/dL — ABNORMAL HIGH (ref 70–99)
Potassium: 4.2 mmol/L (ref 3.5–5.1)
Sodium: 142 mmol/L (ref 135–145)
Total Bilirubin: 1.2 mg/dL (ref 0.3–1.2)
Total Protein: 7.9 g/dL (ref 6.5–8.1)

## 2019-05-17 MED ORDER — SODIUM CHLORIDE (PF) 0.9 % IJ SOLN
INTRAMUSCULAR | Status: AC
  Filled 2019-05-17: qty 50

## 2019-05-17 MED ORDER — IOHEXOL 300 MG/ML  SOLN
100.0000 mL | Freq: Once | INTRAMUSCULAR | Status: AC | PRN
  Administered 2019-05-17: 12:00:00 100 mL via INTRAVENOUS

## 2019-05-18 ENCOUNTER — Inpatient Hospital Stay: Payer: Medicare Other | Admitting: Hematology

## 2019-05-18 ENCOUNTER — Other Ambulatory Visit: Payer: Medicare Other

## 2019-05-18 ENCOUNTER — Inpatient Hospital Stay: Payer: Medicare Other

## 2019-05-19 ENCOUNTER — Other Ambulatory Visit: Payer: Self-pay

## 2019-05-19 DIAGNOSIS — C220 Liver cell carcinoma: Secondary | ICD-10-CM

## 2019-05-19 MED ORDER — ONDANSETRON HCL 4 MG PO TABS: ORAL_TABLET | ORAL | 0 refills | Status: DC

## 2019-05-19 NOTE — Telephone Encounter (Signed)
Patient left VM requesting to reschedule appointments he missed on 05/18/19 and for a refill on his nausea medication.   Scheduling message sent to call patient and reschedule patient for lab/MD/infusion.   Called patient back to let him know scheduling would be contacting him to reschedule his appointments, and to see which nausea medication he needed refilled. Patient stated he wanted his Zofran prescription and to send it to the [facility] on [address]. Patient verbalized understanding and agreement, and denied any further needs at this time.   Zofran prescription e-scribed to [facility]

## 2019-05-22 ENCOUNTER — Telehealth: Payer: Self-pay | Admitting: Hematology

## 2019-05-22 ENCOUNTER — Other Ambulatory Visit: Payer: Self-pay | Admitting: Hematology

## 2019-05-22 NOTE — Telephone Encounter (Signed)
Left message re 10/20 appointments. Rescheduled from 10/15. Left my direct call back number for patient.

## 2019-05-22 NOTE — [facility] Notes (Deleted)
[facility]   [phone] [phone]   Clinic Follow up Note   Patient Care Team: Lanae Boast, FNP as PCP - General (Family Medicine) Comer, Okey Regal, MD as Consulting Physician (Infectious Diseases) Truitt Merle, MD as Consulting Physician (Hematology) 05/22/2019  CHIEF COMPLAINT: F/u [place]  SUMMARY OF ONCOLOGIC HISTORY: Oncology History  Hepatocellular carcinoma ([place])  12/07/2017 Initial Diagnosis   Hepatocellular carcinoma ([place])   12/07/2017 Imaging   US Abdomen 12/07/17 IMPRESSION: ULTRASOUND ABDOMEN: Probable 4 mm gallstone in gallbladder.  Multiple hepatic masses largest heterogeneous RIGHT lobe measuring 6.1 x 5.2 x 5.5 cm; metastatic disease and primary tumor not excluded, recommend further evaluation by MR imaging.  ULTRASOUND HEPATIC ELASTOGRAPHY:  Median hepatic shear wave velocity is calculated at 1.52 m/sec.  Corresponding Metavir fibrosis score is F2 + some F3.  Risk of fibrosis is moderate.  Follow-up: Additional testing appropriate   12/22/2017 PET scan   PET 12/22/17  IMPRESSION: 1. Lesions within the liver have low metabolic activity for size. 2. Dominant lesion in liver does have a focus of metabolic activity which corresponds to a low-density rounded region within the larger lesion. Target this 2 cm focus within the dominant lesion for biopsy. The liver lesions remain concerning for metastatic disease or primary liver carcinoma. Mucinous or cystic carcinomas can have low metabolic activity. 3. Focus of metabolic activity at the level of the hepatic flexure of the colon with some soft tissue thickening not clearly localized. Some additional foci of uptake within colon. This could represent physiologic activity however depending on the biopsy results of the liver recommend colonoscopy.   12/23/2017 Cancer Staging   Staging form: Liver, AJCC 8th Edition - Clinical stage from 12/23/2017: Stage IIIA (cT3, cN0, cM0)  - Signed by Truitt Merle, MD on 12/30/2017   12/23/2017 Initial Biopsy   Diagnosis 12/23/17 Liver, needle/core biopsy, Right lobe - HEPATOCELLULAR CARCINOMA. Microscopic Comment Dr. Lyndon Code has reviewed the case. Dr. Burr Medico was paged on 12/24/2017.     02/18/2018 Imaging   CT AP W WO Contrast 02/18/18  IMPRESSION: 1. Multifocal hepatocellular carcinoma, mildly progressive from baseline MRI 12/10/2017. 2. No evidence of extrahepatic nodal metastases or vascular involvement. 3. Borderline hepatic steatosis. No morphologic changes of underlying cirrhosis or portal hypertension. 4. Cholelithiasis. 5. Mild Aortic Atherosclerosis (ICD10-I70.0).   03/24/2018 Procedure   TACE Chemo ebolization on 03/24/18 with Dr. Kathlene Cote    06/14/2018 Imaging   MRI abdomen 06/14/18  IMPRESSION: 1. Dominant lesion in the central LEFT hepatic lobe (segment 4A) is stable in size, decreased in central enhancement, and with persistent nodular wall enhancement. 2. Multiple additional smaller lesions within LEFT and RIGHT hepatic lobe are increased in size. 3. Potential new lesions on the early arterial phase imaging.   08/16/2018 Imaging   CT Chest 08/16/18   IMPRESSION: Multifocal hepatic masses in this patient with known liver cancer, similar to prior MRI, although poorly evaluated.  No evidence of metastatic disease in the chest.  Aortic Atherosclerosis (ICD10-I70.0) and Emphysema (ICD10-J43.9).   09/13/2018 Procedure   Y90 treatment with Dr. Kathlene Cote on 09/13/18 and 01/09/19   01/29/2019 Imaging    CT AP WO contrast 01/29/19 IMPRESSION: 1. No acute findings within the abdomen or pelvis. 2. Mild decrease in multiple liver masses compared to previous MRI.   02/09/2019 Imaging   MRI Abdomen  IMPRESSION: 1. Today's study demonstrates a mixed response to therapy. Overall, most lesions are decreased in size compared to the prior examination, with the exception of  2 new lesions in the liver, as detailed  above. Findings remain most compatible with multifocal hepatocellular carcinoma. 2. Hepatic steatosis and hepatic cirrhosis.   02/15/2019 -  Chemotherapy   atezolizumab (Tecentriq) and bevacizumab (Avastin) q3weeks starting with cycle 1 Tecentriq only starting 02/15/19, added Avastin with cycle 2.    03/02/2019 Procedure   Upper Endoscopy by Dr. Tarri Glenn 03/02/19  IMPRESSION - Irregular z-line suspicious for Barrett's esophagus. - Non-bleeding erosive gastropathy and suspected gastritis. Biopsied. - Small hiatal hernia. - Normal examined duodenum. - No varices or portal hypertensive gastropathy present. 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 - THERE IS NO EVIDENCE OF DYSPLASIA OR MALIGNANCY. - SEE COMMENT.   05/17/2019 Imaging   CT AP IMPRESSION: 1. Mixed appearance in the liver, some lesions are significantly smaller other lesions have enlarged. In terms of overall metastatic burden this essentially balances out. 2. Mildly enlarged peripancreatic lymph node, not well seen previously. 3. Small gallstones. 4.  Prominent stool throughout the colon favors constipation. 5. Cirrhosis likely with geographic fibrosis.       CURRENT THERAPY: atezolizumab(Tecentriq)and bevacizumab (Avastin) q3weeks starting with cycle 1 Tecentriq onlyon 02/15/19.AddedAvastin with cycle 2.  INTERVAL HISTORY: Mr. Champney returns for f/u and treatment as scheduled. He completed cycle 4 atezo and bevacizumab on 04/27/19. He underwent restaging CT scan in the interim.    REVIEW OF SYSTEMS:   Constitutional: Denies fevers, chills or abnormal weight loss Eyes: Denies blurriness of vision Ears, nose, mouth, throat, and face: Denies mucositis or sore throat Respiratory: Denies cough, dyspnea or wheezes Cardiovascular: Denies palpitation, chest discomfort or lower extremity swelling Gastrointestinal:  Denies nausea, heartburn or  change in bowel habits Skin: Denies abnormal skin rashes Lymphatics: Denies new lymphadenopathy or easy bruising Neurological:Denies numbness, tingling or new weaknesses Behavioral/Psych: Mood is stable, no new changes  All other systems were reviewed with the patient and are negative.  MEDICAL HISTORY:  Past Medical History:  Diagnosis Date   Cancer ([place]) 2019   liver cancer   Chest pain    pt states when he turned over on other side aided in relief   Diabetes mellitus    Dyspnea    increased activity   Elevated cholesterol    Hepatitis C    Hypertension     SURGICAL HISTORY: Past Surgical History:  Procedure Laterality Date   I&D EXTREMITY  06/28/2011   Procedure: IRRIGATION AND DEBRIDEMENT EXTREMITY;  Surgeon: Linna Hoff;  Location: [facility];  Service: Orthopedics;  Laterality: Left;  with application of wound vac   INCISION AND DRAINAGE OF WOUND  07/02/2011   Procedure: IRRIGATION AND DEBRIDEMENT WOUND;  Surgeon: Linna Hoff;  Location: [facility];  Service: Orthopedics;  Laterality: Left;  Irrigation and Debridement of left arm wound with  Skin Grafting from left thigh    IR ANGIOGRAM SELECTIVE EACH ADDITIONAL VESSEL  03/18/2018   IR ANGIOGRAM SELECTIVE EACH ADDITIONAL VESSEL  08/26/2018   IR ANGIOGRAM SELECTIVE EACH ADDITIONAL VESSEL  08/26/2018   IR ANGIOGRAM SELECTIVE EACH ADDITIONAL VESSEL  08/26/2018   IR ANGIOGRAM SELECTIVE EACH ADDITIONAL VESSEL  08/26/2018   IR ANGIOGRAM SELECTIVE EACH ADDITIONAL VESSEL  09/13/2018   IR ANGIOGRAM SELECTIVE EACH ADDITIONAL VESSEL  01/09/2019   IR ANGIOGRAM VISCERAL SELECTIVE  03/18/2018   IR ANGIOGRAM VISCERAL SELECTIVE  03/18/2018   IR ANGIOGRAM VISCERAL SELECTIVE  08/26/2018   IR ANGIOGRAM VISCERAL SELECTIVE  08/26/2018   IR ANGIOGRAM VISCERAL SELECTIVE  09/13/2018   IR ANGIOGRAM VISCERAL SELECTIVE  01/09/2019   IR EMBO ARTERIAL NOT HEMORR HEMANG INC GUIDE ROADMAPPING  08/26/2018   IR EMBO TUMOR ORGAN ISCHEMIA INFARCT  INC GUIDE ROADMAPPING  03/18/2018   IR EMBO TUMOR ORGAN ISCHEMIA INFARCT INC GUIDE ROADMAPPING  09/13/2018   IR EMBO TUMOR ORGAN ISCHEMIA INFARCT INC GUIDE ROADMAPPING  01/09/2019   IR RADIOLOGIST EVAL & MGMT  02/16/2018   IR RADIOLOGIST EVAL & MGMT  07/12/2018   IR US GUIDE VASC ACCESS RIGHT  03/18/2018   IR US GUIDE VASC ACCESS RIGHT  08/26/2018   IR US GUIDE VASC ACCESS RIGHT  09/13/2018   IR US GUIDE VASC ACCESS RIGHT  01/09/2019   JOINT REPLACEMENT     PARTIAL HIP ARTHROPLASTY      I have reviewed the social history and family history with the patient and they are unchanged from previous note.  ALLERGIES:  is allergic to gadolinium derivatives and multihance [gadobenate].  MEDICATIONS:  Current Outpatient Medications  Medication Sig Dispense Refill   cyclobenzaprine (FLEXERIL) 10 MG tablet Take 1/2 to 1 whole tablet by mouth every 8 hours as needed for muscle spasms. 15 tablet 0   diclofenac (VOLTAREN) 75 MG EC tablet Take 1 tablet (75 mg total) by mouth 2 (two) times daily as needed for moderate pain. 30 tablet 0   gabapentin (NEURONTIN) 300 MG capsule Take 1 capsule (300 mg total) by mouth 3 (three) times daily. 270 capsule 1   glipiZIDE (GLUCOTROL) 10 MG tablet TAKE 1 TABLET(10 MG) BY MOUTH TWICE DAILY BEFORE A MEAL 180 tablet 2   Glucerna (GLUCERNA) LIQD Take 1 Can by mouth 3 (three) times daily between meals. 21330 mL 3   glucose blood test strip Check blood sugar 3 times per day prior to meals. Use as instructed 100 each 12   hydrochlorothiazide (HYDRODIURIL) 25 MG tablet TAKE 1 TABLET(25 MG) BY MOUTH DAILY (Patient taking differently: Take 25 mg by mouth daily. ) 90 tablet 1   insulin glargine (LANTUS) 100 UNIT/ML injection Inject 0.3 mLs (30 Units total) into the skin at bedtime. 10 mL 11   Insulin Pen Needle (PEN NEEDLES) 32G X 4 MM MISC 1 packet by Does not apply route 3 (three) times daily as needed. 100 each 2   lisinopril (PRINIVIL,ZESTRIL) 20 MG tablet Take 1  tablet (20 mg total) by mouth daily. 90 tablet 1   metFORMIN (GLUCOPHAGE) 1000 MG tablet TAKE 1 TABLET(1000 MG) BY MOUTH TWICE DAILY 180 tablet 0   ondansetron (ZOFRAN) 4 MG tablet TAKE 1 TABLET(4 MG) BY MOUTH EVERY 8 HOURS AS NEEDED FOR NAUSEA OR VOMITING 30 tablet 0   oxyCODONE (OXY IR/ROXICODONE) 5 MG immediate release tablet Take 1 tablet (5 mg total) by mouth every 8 (eight) hours as needed for severe pain. 90 tablet 0   pantoprazole (PROTONIX) 40 MG tablet Pantoprazole 40 mg every morning for 8 weeks. 90 tablet 0   pravastatin (PRAVACHOL) 80 MG tablet Take 1 tablet (80 mg total) by mouth daily. 90 tablet 1   predniSONE (DELTASONE) 50 MG tablet Take Prednisone 50 mg  13 hr,  7 hr,  And  1 hr before MRI scan  As directed. 3 tablet 0   prochlorperazine (COMPAZINE) 10 MG tablet Take 1 tablet (10 mg total) by mouth every 6 (six) hours as needed for nausea or vomiting. 30 tablet 1   senna (SENOKOT) 8.6 MG tablet Take 1 tablet by mouth as needed for constipation.  Sofosbuvir-Velpatasvir (EPCLUSA) 400-100 MG TABS Take 1 tablet by mouth daily. 28 tablet 2   Syringe, Disposable, (B-D 30CC SYRINGE) 30 ML MISC 1 Syringe by Does not apply route as directed. 50 each 5   No current facility-administered medications for this visit.     PHYSICAL EXAMINATION: ECOG PERFORMANCE STATUS: {CHL ONC ECOG PS:[phone]}  There were no vitals filed for this visit. There were no vitals filed for this visit.  GENERAL:alert, no distress and comfortable SKIN: skin color, texture, turgor are normal, no rashes or significant lesions EYES: normal, Conjunctiva are pink and non-injected, sclera clear OROPHARYNX:no exudate, no erythema and lips, buccal mucosa, and tongue normal  NECK: supple, thyroid normal size, non-tender, without nodularity LYMPH:  no palpable lymphadenopathy in the cervical, axillary or inguinal LUNGS: clear to auscultation and percussion with normal breathing effort HEART: regular  rate & rhythm and no murmurs and no lower extremity edema ABDOMEN:abdomen soft, non-tender and normal bowel sounds Musculoskeletal:no cyanosis of digits and no clubbing  NEURO: alert & oriented x 3 with fluent speech, no focal motor/sensory deficits  LABORATORY DATA:  I have reviewed the data as listed CBC Latest Ref Rng & Units 05/17/2019 04/27/2019 04/06/2019  WBC 4.0 - 10.5 K/uL 3.5(L) 3.2(L) 2.2(L)  Hemoglobin 13.0 - 17.0 g/dL 15.9 15.7 13.8  Hematocrit 39.0 - 52.0 % 48.8 47.6 41.0  Platelets 150 - 400 K/uL 221 226 283     CMP Latest Ref Rng & Units 05/17/2019 04/27/2019 04/06/2019  Glucose 70 - 99 mg/dL 222(H) 259(H) 231(H)  BUN 6 - 20 mg/dL 17 14 11   Creatinine 0.61 - 1.24 mg/dL 1.08 1.15 0.98  Sodium 135 - 145 mmol/L 142 138 135  Potassium 3.5 - 5.1 mmol/L 4.2 4.6 4.1  Chloride 98 - 111 mmol/L 103 103 101  CO2 22 - 32 mmol/L 23 27 27   Calcium 8.9 - 10.3 mg/dL 9.9 9.6 9.3  Total Protein 6.5 - 8.1 g/dL 7.9 7.5 6.9  Total Bilirubin 0.3 - 1.2 mg/dL 1.2 0.9 1.1  Alkaline Phos 38 - 126 U/L 135(H) 127(H) 110  AST 15 - 41 U/L 52(H) 58(H) 72(H)  ALT 0 - 44 U/L 39 43 48(H)      RADIOGRAPHIC STUDIES: I have personally reviewed the radiological images as listed and agreed with the findings in the report. No results found.   ASSESSMENT & PLAN:  No problem-specific Assessment & Plan notes found for this encounter.   No orders of the defined types were placed in this encounter.  All questions were answered. The patient knows to call the clinic with any problems, questions or concerns. No barriers to learning was detected. I spent {CHL ONC TIME VISIT - TMYTR:[id number]} counseling the patient face to face. The total time spent in the appointment was {CHL ONC TIME VISIT - CVUDT:[id number]} and more than 50% was on counseling and review of test results     Alla Feeling, NP 05/22/19

## 2019-05-23 ENCOUNTER — Other Ambulatory Visit: Payer: Medicare Other

## 2019-05-23 ENCOUNTER — Ambulatory Visit: Payer: Medicare Other | Admitting: Nurse Practitioner

## 2019-05-23 ENCOUNTER — Ambulatory Visit: Payer: Medicare Other

## 2019-05-24 ENCOUNTER — Telehealth: Payer: Self-pay

## 2019-05-24 NOTE — Telephone Encounter (Signed)
Patient calls stating he wants to reschedule his appointments he missed last week.  Scheduling message has been sent.

## 2019-05-25 ENCOUNTER — Telehealth: Payer: Self-pay | Admitting: Hematology

## 2019-05-25 NOTE — Telephone Encounter (Signed)
R/s appt per 10/21 sch message - pt is aware of new appt date and time

## 2019-05-31 NOTE — Progress Notes (Signed)
[employer]   Telephone:([phone]) [phone] Fax:(336) [phone]   Clinic Follow up Note   Patient Care Team: Lanae Boast, FNP as PCP - General (Family Medicine) Comer, Okey Regal, MD as Consulting Physician (Infectious Diseases) Truitt Merle, MD as Consulting Physician (Hematology)  Date of Service:  06/01/2019  CHIEF COMPLAINT: F/u of [name]  SUMMARY OF ONCOLOGIC HISTORY: Oncology History  Hepatocellular carcinoma ([place])  12/07/2017 Initial Diagnosis   Hepatocellular carcinoma ([place])   12/07/2017 Imaging   US Abdomen 12/07/17 IMPRESSION: ULTRASOUND ABDOMEN: Probable 4 mm gallstone in gallbladder.  Multiple hepatic masses largest heterogeneous RIGHT lobe measuring 6.1 x 5.2 x 5.5 cm; metastatic disease and primary tumor not excluded, recommend further evaluation by MR imaging.  ULTRASOUND HEPATIC ELASTOGRAPHY:  Median hepatic shear wave velocity is calculated at 1.52 m/sec.  Corresponding Metavir fibrosis score is F2 + some F3.  Risk of fibrosis is moderate.  Follow-up: Additional testing appropriate   12/22/2017 PET scan   PET 12/22/17  IMPRESSION: 1. Lesions within the liver have low metabolic activity for size. 2. Dominant lesion in liver does have a focus of metabolic activity which corresponds to a low-density rounded region within the larger lesion. Target this 2 cm focus within the dominant lesion for biopsy. The liver lesions remain concerning for metastatic disease or primary liver carcinoma. Mucinous or cystic carcinomas can have low metabolic activity. 3. Focus of metabolic activity at the level of the hepatic flexure of the colon with some soft tissue thickening not clearly localized. Some additional foci of uptake within colon. This could represent physiologic activity however depending on the biopsy results of the liver recommend colonoscopy.   12/23/2017 Cancer Staging   Staging form: Liver, AJCC 8th Edition - Clinical stage from 12/23/2017:  Stage IIIA (cT3, cN0, cM0) - Signed by Truitt Merle, MD on 12/30/2017   12/23/2017 Initial Biopsy   Diagnosis 12/23/17 Liver, needle/core biopsy, Right lobe - HEPATOCELLULAR CARCINOMA. Microscopic Comment Dr. Lyndon Code has reviewed the case. Dr. Burr Medico was paged on 12/24/2017.     02/18/2018 Imaging   CT AP W WO Contrast 02/18/18  IMPRESSION: 1. Multifocal hepatocellular carcinoma, mildly progressive from baseline MRI 12/10/2017. 2. No evidence of extrahepatic nodal metastases or vascular involvement. 3. Borderline hepatic steatosis. No morphologic changes of underlying cirrhosis or portal hypertension. 4. Cholelithiasis. 5. Mild Aortic Atherosclerosis (ICD10-I70.0).   03/24/2018 Procedure   TACE Chemo ebolization on 03/24/18 with Dr. Kathlene Cote    06/14/2018 Imaging   MRI abdomen 06/14/18  IMPRESSION: 1. Dominant lesion in the central LEFT hepatic lobe (segment 4A) is stable in size, decreased in central enhancement, and with persistent nodular wall enhancement. 2. Multiple additional smaller lesions within LEFT and RIGHT hepatic lobe are increased in size. 3. Potential new lesions on the early arterial phase imaging.   08/16/2018 Imaging   CT Chest 08/16/18   IMPRESSION: Multifocal hepatic masses in this patient with known liver cancer, similar to prior MRI, although poorly evaluated.  No evidence of metastatic disease in the chest.  Aortic Atherosclerosis (ICD10-I70.0) and Emphysema (ICD10-J43.9).   09/13/2018 Procedure   Y90 treatment with Dr. Kathlene Cote on 09/13/18 and 01/09/19   01/29/2019 Imaging    CT AP WO contrast 01/29/19 IMPRESSION: 1. No acute findings within the abdomen or pelvis. 2. Mild decrease in multiple liver masses compared to previous MRI.   02/09/2019 Imaging   MRI Abdomen  IMPRESSION: 1. Today's study demonstrates a mixed response to therapy. Overall, most lesions are decreased in size compared to the  prior examination, with the exception of 2 new lesions in  the liver, as detailed above. Findings remain most compatible with multifocal hepatocellular carcinoma. 2. Hepatic steatosis and hepatic cirrhosis.   02/15/2019 -  Chemotherapy   atezolizumab (Tecentriq) and bevacizumab (Avastin) q3weeks starting with cycle 1 Tecentriq only starting 02/15/19, added Avastin with cycle 2.    03/02/2019 Procedure   Upper Endoscopy by Dr. Tarri Glenn 03/02/19  IMPRESSION - Irregular z-line suspicious for Barrett's esophagus. - Non-bleeding erosive gastropathy and suspected gastritis. Biopsied. - Small hiatal hernia. - Normal examined duodenum. - No varices or portal hypertensive gastropathy present. 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 - THERE IS NO EVIDENCE OF DYSPLASIA OR MALIGNANCY. - SEE COMMENT.   05/17/2019 Imaging   CT AP IMPRESSION: 1. Mixed appearance in the liver, some lesions are significantly smaller other lesions have enlarged. In terms of overall metastatic burden this essentially balances out. 2. Mildly enlarged peripancreatic lymph node, not well seen previously. 3. Small gallstones. 4.  Prominent stool throughout the colon favors constipation. 5. Cirrhosis likely with geographic fibrosis.        CURRENT THERAPY:  atezolizumab(Tecentriq)and bevacizumab (Avastin) q3weeks starting with cycle 1 Tecentriq onlyon 02/15/19.AddedAvastin with cycle 2.  INTERVAL HISTORY:  [name] is here for a follow up and treatment. He presents to the clinic alone.  He has been having mild to moderate diarrhea and hematochezia for the past 3 to 4 weeks.  Bleeding is mild to moderate, with most of his bowel movement.  He used to be constipated, no nausea or vomiting, or fever.  His right upper quadrant abdominal pain is stable overall, no other new complaints.  He has lost a few pounds, appetite is fair, review of system otherwise negative.   MEDICAL HISTORY:  Past  Medical History:  Diagnosis Date   Cancer ([place]) 2019   liver cancer   Chest pain    pt states when he turned over on other side aided in relief   Diabetes mellitus    Dyspnea    increased activity   Elevated cholesterol    Hepatitis C    Hypertension     SURGICAL HISTORY: Past Surgical History:  Procedure Laterality Date   I&D EXTREMITY  06/28/2011   Procedure: IRRIGATION AND DEBRIDEMENT EXTREMITY;  Surgeon: Linna Hoff;  Location: [place];  Service: Orthopedics;  Laterality: Left;  with application of wound vac   INCISION AND DRAINAGE OF WOUND  07/02/2011   Procedure: IRRIGATION AND DEBRIDEMENT WOUND;  Surgeon: Linna Hoff;  Location: [place];  Service: Orthopedics;  Laterality: Left;  Irrigation and Debridement of left arm wound with  Skin Grafting from left thigh    IR ANGIOGRAM SELECTIVE EACH ADDITIONAL VESSEL  03/18/2018   IR ANGIOGRAM SELECTIVE EACH ADDITIONAL VESSEL  08/26/2018   IR ANGIOGRAM SELECTIVE EACH ADDITIONAL VESSEL  08/26/2018   IR ANGIOGRAM SELECTIVE EACH ADDITIONAL VESSEL  08/26/2018   IR ANGIOGRAM SELECTIVE EACH ADDITIONAL VESSEL  08/26/2018   IR ANGIOGRAM SELECTIVE EACH ADDITIONAL VESSEL  09/13/2018   IR ANGIOGRAM SELECTIVE EACH ADDITIONAL VESSEL  01/09/2019   IR ANGIOGRAM VISCERAL SELECTIVE  03/18/2018   IR ANGIOGRAM VISCERAL SELECTIVE  03/18/2018   IR ANGIOGRAM VISCERAL SELECTIVE  08/26/2018   IR ANGIOGRAM VISCERAL SELECTIVE  08/26/2018   IR ANGIOGRAM VISCERAL SELECTIVE  09/13/2018   IR ANGIOGRAM VISCERAL SELECTIVE  01/09/2019   IR EMBO ARTERIAL NOT HEMORR HEMANG INC GUIDE ROADMAPPING  08/26/2018  IR EMBO TUMOR ORGAN ISCHEMIA INFARCT INC GUIDE ROADMAPPING  03/18/2018   IR EMBO TUMOR ORGAN ISCHEMIA INFARCT INC GUIDE ROADMAPPING  09/13/2018   IR EMBO TUMOR ORGAN ISCHEMIA INFARCT INC GUIDE ROADMAPPING  01/09/2019   IR RADIOLOGIST EVAL & MGMT  02/16/2018   IR RADIOLOGIST EVAL & MGMT  07/12/2018   IR US GUIDE VASC ACCESS RIGHT  03/18/2018   IR US  GUIDE VASC ACCESS RIGHT  08/26/2018   IR US GUIDE VASC ACCESS RIGHT  09/13/2018   IR US GUIDE VASC ACCESS RIGHT  01/09/2019   JOINT REPLACEMENT     PARTIAL HIP ARTHROPLASTY      I have reviewed the social history and family history with the patient and they are unchanged from previous note.  ALLERGIES:  is allergic to gadolinium derivatives and multihance [gadobenate].  MEDICATIONS:  Current Outpatient Medications  Medication Sig Dispense Refill   cyclobenzaprine (FLEXERIL) 10 MG tablet Take 1/2 to 1 whole tablet by mouth every 8 hours as needed for muscle spasms. 15 tablet 0   diclofenac (VOLTAREN) 75 MG EC tablet Take 1 tablet (75 mg total) by mouth 2 (two) times daily as needed for moderate pain. 30 tablet 0   gabapentin (NEURONTIN) 300 MG capsule Take 1 capsule (300 mg total) by mouth 3 (three) times daily. 270 capsule 1   glipiZIDE (GLUCOTROL) 10 MG tablet TAKE 1 TABLET(10 MG) BY MOUTH TWICE DAILY BEFORE A MEAL 180 tablet 2   Glucerna (GLUCERNA) LIQD Take 1 Can by mouth 3 (three) times daily between meals. 21330 mL 3   glucose blood test strip Check blood sugar 3 times per day prior to meals. Use as instructed 100 each 12   hydrochlorothiazide (HYDRODIURIL) 25 MG tablet TAKE 1 TABLET(25 MG) BY MOUTH DAILY (Patient taking differently: Take 25 mg by mouth daily. ) 90 tablet 1   insulin glargine (LANTUS) 100 UNIT/ML injection Inject 0.3 mLs (30 Units total) into the skin at bedtime. 10 mL 11   Insulin Pen Needle (PEN NEEDLES) 32G X 4 MM MISC 1 packet by Does not apply route 3 (three) times daily as needed. 100 each 2   lisinopril (PRINIVIL,ZESTRIL) 20 MG tablet Take 1 tablet (20 mg total) by mouth daily. 90 tablet 1   metFORMIN (GLUCOPHAGE) 1000 MG tablet TAKE 1 TABLET(1000 MG) BY MOUTH TWICE DAILY 180 tablet 0   ondansetron (ZOFRAN) 4 MG tablet TAKE 1 TABLET(4 MG) BY MOUTH EVERY 8 HOURS AS NEEDED FOR NAUSEA OR VOMITING 30 tablet 0   oxyCODONE (OXY IR/ROXICODONE) 5 MG  immediate release tablet Take 1 tablet (5 mg total) by mouth every 8 (eight) hours as needed for severe pain. 90 tablet 0   pantoprazole (PROTONIX) 40 MG tablet Pantoprazole 40 mg every morning for 8 weeks. 90 tablet 0   pravastatin (PRAVACHOL) 80 MG tablet Take 1 tablet (80 mg total) by mouth daily. 90 tablet 1   predniSONE (DELTASONE) 50 MG tablet Take Prednisone 50 mg  13 hr,  7 hr,  And  1 hr before MRI scan  As directed. 3 tablet 0   prochlorperazine (COMPAZINE) 10 MG tablet Take 1 tablet (10 mg total) by mouth every 6 (six) hours as needed for nausea or vomiting. 30 tablet 1   senna (SENOKOT) 8.6 MG tablet Take 1 tablet by mouth as needed for constipation.     Sofosbuvir-Velpatasvir (EPCLUSA) 400-100 MG TABS Take 1 tablet by mouth daily. 28 tablet 2   Syringe, Disposable, (B-D 30CC SYRINGE) 30 ML  MISC 1 Syringe by Does not apply route as directed. 50 each 5   No current facility-administered medications for this visit.     PHYSICAL EXAMINATION: ECOG PERFORMANCE STATUS: 2 - Symptomatic, <50% confined to bed  Vitals:   06/01/19 0904  BP: (!) 145/84  Pulse: 97  Resp: 18  Temp: 98.2 F (36.8 C)  SpO2: 100%   Filed Weights   06/01/19 0904  Weight: 147 lb 4.8 oz (66.8 kg)   GENERAL:alert, no distress and comfortable SKIN: skin color, texture, turgor are normal, no rashes or significant lesions EYES: normal, Conjunctiva are pink and non-injected, sclera clear NECK: supple, thyroid normal size, non-tender, without nodularity LYMPH:  no palpable lymphadenopathy in the cervical, axillary  LUNGS: clear to auscultation and percussion with normal breathing effort HEART: regular rate & rhythm and no murmurs and no lower extremity edema ABDOMEN:abdomen soft, (+) diffuse tenderness, especially in the right upper quadrant, and lower abdomen.  Bowel sounds active, no organomegaly.  Rectal exam was negative for rectal mass or blood on glove  Musculoskeletal:no cyanosis of digits and no  clubbing  NEURO: alert & oriented x 3 with fluent speech, no focal motor/sensory deficits  LABORATORY DATA:  I have reviewed the data as listed CBC Latest Ref Rng & Units 06/01/2019 05/17/2019 04/27/2019  WBC 4.0 - 10.5 K/uL 4.1 3.5(L) 3.2(L)  Hemoglobin 13.0 - 17.0 g/dL 15.4 15.9 15.7  Hematocrit 39.0 - 52.0 % 45.5 48.8 47.6  Platelets 150 - 400 K/uL 259 221 226     CMP Latest Ref Rng & Units 06/01/2019 05/17/2019 04/27/2019  Glucose 70 - 99 mg/dL 171(H) 222(H) 259(H)  BUN 6 - 20 mg/dL 10 17 14   Creatinine 0.61 - 1.24 mg/dL 0.95 1.08 1.15  Sodium 135 - 145 mmol/L 137 142 138  Potassium 3.5 - 5.1 mmol/L 4.2 4.2 4.6  Chloride 98 - 111 mmol/L 102 103 103  CO2 22 - 32 mmol/L 25 23 27   Calcium 8.9 - 10.3 mg/dL 9.7 9.9 9.6  Total Protein 6.5 - 8.1 g/dL 7.3 7.9 7.5  Total Bilirubin 0.3 - 1.2 mg/dL 1.3(H) 1.2 0.9  Alkaline Phos 38 - 126 U/L 151(H) 135(H) 127(H)  AST 15 - 41 U/L 57(H) 52(H) 58(H)  ALT 0 - 44 U/L 37 39 43      RADIOGRAPHIC STUDIES: I have personally reviewed the radiological images as listed and agreed with the findings in the report. No results found.   ASSESSMENT & PLAN:  [name] is a 53 y.o. male with   1.Hepatocellular carcinoma,stage IIIA (cT3N0M0) -He was diagnosed in 12/2017. He is s/p TACE Chemo embolization on 03/24/18 with Dr. Kathlene Cote.  -His repeat MRI in 06/14/18 showed disease progression in liver, except the treated lesion in 4A.No extrahepatic disease on the recent abdominal MRI. We discussed that his liver cancer is not curable at this stage, andgoal of therapy is to control disease andprolong his life. -He underwent Y90 treatmentson2/11/20 and 01/09/19.  -After latest Y90, he has worsening RUQ abdominal pain. He has hepatomegaly with significant tenderness on prior exam.  -Recent CT AP and MRI from June and July showed his previous treated liver lesions have much improved but now has 2 new liver lesions. Overall disease progression.  -I  started him on atezolizumab (Tecentriq) and bevacizumab (Avastin) q3weeks. Started cycle 1 with Tecentriq only on 7/15/20andaddAvastinwith cycle 2.  -He has been tolerating chemotherapy overall well, his abdominal pain overall mildly improved -I personally reviewed and discussed his restaging CT  scan with patient and his wife in detail, which showed mixed response, overall stable disease.  No new lesions.  -Due to his recent new diarrhea, and the rectal bleeding, I will hold treatment today, and refer him back to GI Dr. Tarri Glenn for colonoscopy in the next 1-2 weeks. I would like to rule out immunotherapy related colitis. -will see him back after colonoscopy   2. Abdominal pain, nausea and constipation, new diarrhea and hematochezia -Abdominal pain related to his liver cancer, he is currently on oxycodone every 8 hours as needed -He has chronic constipation, however he has developed new diarrhea and worsening hematochezia -He will see GI for colonoscopy  3. Hepatitis C, untreated, H. Pylorie -He was diagnosed with Hepatitis Cin 09/2017, however he is unsure of how long he has had hepatitis C prior to his diagnosis.  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 -Dr. Becky Augusta not recommendtreatment until Cancer is controlled. -I encouraged the patient to maintain follow up with Dr. Linus Salmons. -No significant liver cirrhosis on MRI. 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  -His GI started him onH.Pylorie treatmentin 03/2019, and repeated H. pylori was negative  4.DM, HTN -Patient is insulin dependent diabetic and on Lantus -Patient latest A1c on12/16/19at 8.4 -He is on lisinopril  -f/u with PCP   5. Alcohol and smoking cessation -I advised and recommended with the patient that he does discontinue use of ETOH due to the patient already with multiple liver issues.  -He does  smoke cigarettes and he has a 40 year history.Irepeatedlyadvised that the patient discontinue use of smoking cigarettes at this time due to the increase risk of lung cancer. -He has reduced smoking to a few cigarettes a day. He has mild SOB and cough. I again encouraged him to completely quit.  -He reduced drinking to beer occasionally. I again recommended complete cessation. He understands.  6. Goal of care discussion  -The patient understands the goal of care is palliative. -he is full code     Plan -Restaging CT scan reviewed person, mixed response, overall stable disease. -Due to his new diarrhea and worsening hematochezia, I will hold treatment today -I sent a message to his GI Dr. Tarri Glenn for urgent colonoscopy  -will see him back after colonoscopy  -Spoke to his wife during his visit.     No problem-specific Assessment & Plan notes found for this encounter.   No orders of the defined types were placed in this encounter.  All questions were answered. The patient knows to call the clinic with any problems, questions or concerns. No barriers to learning was detected. I spent 20 minutes counseling the patient face to face. The total time spent in the appointment was 25 minutes and more than 50% was on counseling and review of test results     Truitt Merle, MD 06/02/2019   I, Joslyn Devon, am acting as scribe for Truitt Merle, MD.   I have reviewed the above documentation for accuracy and completeness, and I agree with the above.

## 2019-06-01 ENCOUNTER — Inpatient Hospital Stay: Payer: Medicare Other

## 2019-06-01 ENCOUNTER — Other Ambulatory Visit: Payer: Self-pay | Admitting: Emergency Medicine

## 2019-06-01 ENCOUNTER — Other Ambulatory Visit: Payer: Self-pay

## 2019-06-01 ENCOUNTER — Telehealth: Payer: Self-pay | Admitting: Hematology

## 2019-06-01 ENCOUNTER — Inpatient Hospital Stay (HOSPITAL_BASED_OUTPATIENT_CLINIC_OR_DEPARTMENT_OTHER): Payer: Medicare Other | Admitting: Hematology

## 2019-06-01 VITALS — BP 145/84 | HR 97 | Temp 98.2°F | Resp 18 | Ht 67.0 in | Wt 147.3 lb

## 2019-06-01 DIAGNOSIS — K625 Hemorrhage of anus and rectum: Secondary | ICD-10-CM

## 2019-06-01 DIAGNOSIS — C220 Liver cell carcinoma: Secondary | ICD-10-CM

## 2019-06-01 LAB — CBC WITH DIFFERENTIAL (CANCER CENTER ONLY)
Abs Immature Granulocytes: 0.02 10*3/uL (ref 0.00–0.07)
Basophils Absolute: 0 10*3/uL (ref 0.0–0.1)
Basophils Relative: 1 %
Eosinophils Absolute: 0.1 10*3/uL (ref 0.0–0.5)
Eosinophils Relative: 2 %
HCT: 45.5 % (ref 39.0–52.0)
Hemoglobin: 15.4 g/dL (ref 13.0–17.0)
Immature Granulocytes: 1 %
Lymphocytes Relative: 14 %
Lymphs Abs: 0.6 10*3/uL — ABNORMAL LOW (ref 0.7–4.0)
MCH: 29.2 pg (ref 26.0–34.0)
MCHC: 33.8 g/dL (ref 30.0–36.0)
MCV: 86.2 fL (ref 80.0–100.0)
Monocytes Absolute: 0.6 10*3/uL (ref 0.1–1.0)
Monocytes Relative: 14 %
Neutro Abs: 2.8 10*3/uL (ref 1.7–7.7)
Neutrophils Relative %: 68 %
Platelet Count: 259 10*3/uL (ref 150–400)
RBC: 5.28 MIL/uL (ref 4.22–5.81)
RDW: 13.2 % (ref 11.5–15.5)
WBC Count: 4.1 10*3/uL (ref 4.0–10.5)
nRBC: 0 % (ref 0.0–0.2)

## 2019-06-01 LAB — CMP (CANCER CENTER ONLY)
ALT: 37 U/L (ref 0–44)
AST: 57 U/L — ABNORMAL HIGH (ref 15–41)
Albumin: 3 g/dL — ABNORMAL LOW (ref 3.5–5.0)
Alkaline Phosphatase: 151 U/L — ABNORMAL HIGH (ref 38–126)
Anion gap: 10 (ref 5–15)
BUN: 10 mg/dL (ref 6–20)
CO2: 25 mmol/L (ref 22–32)
Calcium: 9.7 mg/dL (ref 8.9–10.3)
Chloride: 102 mmol/L (ref 98–111)
Creatinine: 0.95 mg/dL (ref 0.61–1.24)
GFR, Est AFR Am: >60 mL/min (ref >60)
GFR, Estimated: >60 mL/min (ref >60)
Glucose, Bld: 171 mg/dL — ABNORMAL HIGH (ref 70–99)
Potassium: 4.2 mmol/L (ref 3.5–5.1)
Sodium: 137 mmol/L (ref 135–145)
Total Bilirubin: 1.3 mg/dL — ABNORMAL HIGH (ref 0.3–1.2)
Total Protein: 7.3 g/dL (ref 6.5–8.1)

## 2019-06-01 NOTE — Telephone Encounter (Signed)
Scheduled per los. Gave avs and calendar  

## 2019-06-02 ENCOUNTER — Encounter: Payer: Self-pay | Admitting: Hematology

## 2019-06-05 ENCOUNTER — Encounter: Payer: Self-pay | Admitting: Gastroenterology

## 2019-06-06 ENCOUNTER — Other Ambulatory Visit (HOSPITAL_COMMUNITY)
Admission: RE | Admit: 2019-06-06 | Discharge: 2019-06-06 | Disposition: A | Discharge status: Home / Self Care | Payer: Medicare Other | Source: Ambulatory Visit | Attending: Gastroenterology | Admitting: Gastroenterology

## 2019-06-06 ENCOUNTER — Other Ambulatory Visit: Payer: Self-pay | Admitting: Hematology

## 2019-06-06 ENCOUNTER — Ambulatory Visit (INDEPENDENT_AMBULATORY_CARE_PROVIDER_SITE_OTHER): Payer: Medicare Other | Admitting: Nurse Practitioner

## 2019-06-06 ENCOUNTER — Other Ambulatory Visit (HOSPITAL_COMMUNITY): Payer: Medicare Other

## 2019-06-06 ENCOUNTER — Other Ambulatory Visit: Payer: Self-pay

## 2019-06-06 ENCOUNTER — Other Ambulatory Visit (INDEPENDENT_AMBULATORY_CARE_PROVIDER_SITE_OTHER): Payer: Medicare Other

## 2019-06-06 ENCOUNTER — Encounter: Payer: Self-pay | Admitting: Nurse Practitioner

## 2019-06-06 VITALS — BP 150/92 | HR 96 | Temp 97.0°F | Ht 67.0 in | Wt 146.2 lb

## 2019-06-06 DIAGNOSIS — Z20828 Contact with and (suspected) exposure to other viral communicable diseases: Secondary | ICD-10-CM | POA: Diagnosis not present

## 2019-06-06 DIAGNOSIS — K921 Melena: Secondary | ICD-10-CM

## 2019-06-06 DIAGNOSIS — C220 Liver cell carcinoma: Secondary | ICD-10-CM

## 2019-06-06 DIAGNOSIS — Z01812 Encounter for preprocedural laboratory examination: Secondary | ICD-10-CM | POA: Diagnosis present

## 2019-06-06 LAB — CBC
HCT: 45.5 % (ref 39.0–52.0)
Hemoglobin: 15.1 g/dL (ref 13.0–17.0)
MCHC: 33.1 g/dL (ref 30.0–36.0)
MCV: 89.8 fl (ref 78.0–100.0)
Platelets: 264 10*3/uL (ref 150.0–400.0)
RBC: 5.06 Mil/uL (ref 4.22–5.81)
RDW: 14.5 % (ref 11.5–15.5)
WBC: 3.7 10*3/uL — ABNORMAL LOW (ref 4.0–10.5)

## 2019-06-06 MED ORDER — OXYCODONE HCL 5 MG PO TABS: 5.0000 mg | ORAL_TABLET | Freq: Four times a day (QID) | ORAL | 0 refills | Status: DC | PRN

## 2019-06-06 MED ORDER — NA SULFATE-K SULFATE-MG SULF 17.5-3.13-1.6 GM/177ML PO SOLN: 1.0000 | Freq: Once | ORAL | 0 refills | Status: AC

## 2019-06-06 NOTE — H&P (View-Only) (Signed)
06/06/2019 [name] [id number] 1965/11/19   HISTORY OF PRESENT ILLNESS: [name] is a 53 year old male with a past medical history of hypertensin, DM II, chronic hepatitis C GT1a with cirrhosis  and hepatocellular carcinoma stage IIIA initially diagnosed 12/2017.  Treatment with Raeanne Gathers was previously considered but was not initiated due to his active [place].  He is followed by oncologist, Dr. Truitt Merle. Status post TACE Chemo ebolization on 03/24/18 with Dr. Kathlene Cote. Atezolizumab and Bevacizumab Q 3 weeks.  His last infusions were scheduled on 06/01/2019 but were canceled due to the patient's complaint of worsening hematochezia.  He is passing 3-6 bowel movements described as some water with solid small stool mixed with bright red blood which has been his typical bowel pattern since starting chemotherapy and immunotherapy.  He has 2-3 trips to the bathroom when he passes only bright red blood per the rectum.  At times, the blood turns the toilet water completely red.  He has generalized abdominal pain and lower rectal pain during defecation.  He also tells me he has chronic generalized abdominal pain which has not changed since his most recent abdominal/pelvic CT was done 05/17/2019.  See his CT results below.  No fevers or chills.  He denies passing any exudate per the rectum.  CBC 10/29 WBC 4.1. Hg 15.4. He is using Preparation H and Anusol suppositories PRN.  He is scheduled for a diagnostic colonoscopy with Dr. Tarri Glenn Friday, 06/09/2019.  His Covid testing was done today, the results are pending.  He denies taking any further Diclofenac,  no other NSAIDs.  He last took Prednisone prior to his recent CT scan. He underwent an EGD to rule out esophageal varices by Dr. Tarri Glenn on 03/02/2019. The EGD identified H. Pylori gastritis, no evidence of esophageal varices or portal hypertensive gastropathy. H.Pylori was treated with Amoxicillin/Clarithromycin and Pantoprazole.  A posttreatment H. pylori  stool antigen 05/12/2019 was negative. He remains on pantoprazole 40 mg once daily.  No dysphagia or heartburn. Father with history of liver disease. No family history of colorectal cancer. His significant other is present.   EGD 03/02/2019 by Dr. Tarri Glenn  -Irregular z-line suspicious for Barrett's esophagus. - Non-bleeding erosive gastropathy and suspected gastritis. Biopsied - Small hiatal hernia. - Normal examined duodenum. - No varices or portal hypertensive gastropathy present  CBC Latest Ref Rng & Units 06/01/2019 05/17/2019 04/27/2019  WBC 4.0 - 10.5 K/uL 4.1 3.5(L) 3.2(L)  Hemoglobin 13.0 - 17.0 g/dL 15.4 15.9 15.7  Hematocrit 39.0 - 52.0 % 45.5 48.8 47.6  Platelets 150 - 400 K/uL 259 221 226   CMP Latest Ref Rng & Units 06/01/2019 05/17/2019 04/27/2019  Glucose 70 - 99 mg/dL 171(H) 222(H) 259(H)  BUN 6 - 20 mg/dL 10 17 14   Creatinine 0.61 - 1.24 mg/dL 0.95 1.08 1.15  Sodium 135 - 145 mmol/L 137 142 138  Potassium 3.5 - 5.1 mmol/L 4.2 4.2 4.6  Chloride 98 - 111 mmol/L 102 103 103  CO2 22 - 32 mmol/L 25 23 27   Calcium 8.9 - 10.3 mg/dL 9.7 9.9 9.6  Total Protein 6.5 - 8.1 g/dL 7.3 7.9 7.5  Total Bilirubin 0.3 - 1.2 mg/dL 1.3(H) 1.2 0.9  Alkaline Phos 38 - 126 U/L 151(H) 135(H) 127(H)  AST 15 - 41 U/L 57(H) 52(H) 58(H)  ALT 0 - 44 U/L 37 39 43   Lab Results  Component Value Date   AFPTM 27.1 (H) 01/02/2019   AFPTM 12.0 (H) 12/09/2017  Abdominal/pelvic CT 05/17/2019: 1. Mixed appearance in the liver, some lesions are significantly smaller other lesions have enlarged. In terms of overall metastatic burden this essentially balances out. 2. Mildly enlarged peripancreatic lymph node, not well seen previously. 3. Small gallstones. 4.  Prominent stool throughout the colon favors constipation. 5. Cirrhosis likely with geographic fibrosis.  Abdominal MRI with and without contrast 02/08/2019: 1. Today's study demonstrates a mixed response to therapy. Overall, most lesions are  decreased in size compared to the prior examination, with the exception of 2 new lesions in the liver, as detailed above. Findings remain most compatible with multifocal hepatocellular carcinoma. 2. Hepatic steatosis and hepatic cirrhosis.   Ultrasound Hepatic Elastography 12/07/2017: Median hepatic shear wave velocity is calculated at 1.52 m/sec. Corresponding Metavir fibrosis score is F2 + some F3. Risk of fibrosis is moderate.  Past Medical History:  Diagnosis Date   Chest pain    pt states when he turned over on other side aided in relief   Diabetes mellitus    Dyspnea    increased activity   Elevated cholesterol    Hepatitis C    Hypertension    Liver cancer ([place]) 2019   Past Surgical History:  Procedure Laterality Date   I&D EXTREMITY  06/28/2011   Procedure: IRRIGATION AND DEBRIDEMENT EXTREMITY;  Surgeon: Linna Hoff;  Location: [facility];  Service: Orthopedics;  Laterality: Left;  with application of wound vac   INCISION AND DRAINAGE OF WOUND  07/02/2011   Procedure: IRRIGATION AND DEBRIDEMENT WOUND;  Surgeon: Linna Hoff;  Location: [facility];  Service: Orthopedics;  Laterality: Left;  Irrigation and Debridement of left arm wound with  Skin Grafting from left thigh    IR ANGIOGRAM SELECTIVE EACH ADDITIONAL VESSEL  03/18/2018   IR ANGIOGRAM SELECTIVE EACH ADDITIONAL VESSEL  08/26/2018   IR ANGIOGRAM SELECTIVE EACH ADDITIONAL VESSEL  08/26/2018   IR ANGIOGRAM SELECTIVE EACH ADDITIONAL VESSEL  08/26/2018   IR ANGIOGRAM SELECTIVE EACH ADDITIONAL VESSEL  08/26/2018   IR ANGIOGRAM SELECTIVE EACH ADDITIONAL VESSEL  09/13/2018   IR ANGIOGRAM SELECTIVE EACH ADDITIONAL VESSEL  01/09/2019   IR ANGIOGRAM VISCERAL SELECTIVE  03/18/2018   IR ANGIOGRAM VISCERAL SELECTIVE  03/18/2018   IR ANGIOGRAM VISCERAL SELECTIVE  08/26/2018   IR ANGIOGRAM VISCERAL SELECTIVE  08/26/2018   IR ANGIOGRAM VISCERAL SELECTIVE  09/13/2018   IR ANGIOGRAM VISCERAL SELECTIVE  01/09/2019   IR  EMBO ARTERIAL NOT HEMORR HEMANG INC GUIDE ROADMAPPING  08/26/2018   IR EMBO TUMOR ORGAN ISCHEMIA INFARCT INC GUIDE ROADMAPPING  03/18/2018   IR EMBO TUMOR ORGAN ISCHEMIA INFARCT INC GUIDE ROADMAPPING  09/13/2018   IR EMBO TUMOR ORGAN ISCHEMIA INFARCT INC GUIDE ROADMAPPING  01/09/2019   IR RADIOLOGIST EVAL & MGMT  02/16/2018   IR RADIOLOGIST EVAL & MGMT  07/12/2018   IR US GUIDE VASC ACCESS RIGHT  03/18/2018   IR US GUIDE VASC ACCESS RIGHT  08/26/2018   IR US GUIDE VASC ACCESS RIGHT  09/13/2018   IR US GUIDE VASC ACCESS RIGHT  01/09/2019   JOINT REPLACEMENT     PARTIAL HIP ARTHROPLASTY      reports that he has been smoking cigarettes. He has a 40.00 pack-year smoking history. He has never used smokeless tobacco. He reports current alcohol use of about 40.0 - 41.0 standard drinks of alcohol per week. He reports that he does not use drugs. family history includes Diabetes in his mother and another family member; Hypertension in an other family member; Liver disease  in his father. Allergies  Allergen Reactions   Gadolinium Derivatives Hives and Itching    Pt after contrast injection. Immediately complained of total body itching and hives. Pt was pulled out of the scanner and given IV benadryl per ER MD.    Multihance [Gadobenate] Itching    1-2 minutes into contrast infusion, patient itching all over face, neck and chest requiring IV benadryl       Outpatient Encounter Medications as of 06/06/2019  Medication Sig   cyclobenzaprine (FLEXERIL) 10 MG tablet Take 1/2 to 1 whole tablet by mouth every 8 hours as needed for muscle spasms.   diclofenac (VOLTAREN) 75 MG EC tablet Take 1 tablet (75 mg total) by mouth 2 (two) times daily as needed for moderate pain.   gabapentin (NEURONTIN) 300 MG capsule Take 1 capsule (300 mg total) by mouth 3 (three) times daily.   glipiZIDE (GLUCOTROL) 10 MG tablet TAKE 1 TABLET(10 MG) BY MOUTH TWICE DAILY BEFORE A MEAL   Glucerna (GLUCERNA) LIQD Take 1 Can  by mouth 3 (three) times daily between meals.   glucose blood test strip Check blood sugar 3 times per day prior to meals. Use as instructed   hydrochlorothiazide (HYDRODIURIL) 25 MG tablet TAKE 1 TABLET(25 MG) BY MOUTH DAILY (Patient taking differently: Take 25 mg by mouth daily. )   insulin glargine (LANTUS) 100 UNIT/ML injection Inject 0.3 mLs (30 Units total) into the skin at bedtime.   Insulin Pen Needle (PEN NEEDLES) 32G X 4 MM MISC 1 packet by Does not apply route 3 (three) times daily as needed.   lisinopril (PRINIVIL,ZESTRIL) 20 MG tablet Take 1 tablet (20 mg total) by mouth daily.   metFORMIN (GLUCOPHAGE) 1000 MG tablet TAKE 1 TABLET(1000 MG) BY MOUTH TWICE DAILY   ondansetron (ZOFRAN) 4 MG tablet TAKE 1 TABLET(4 MG) BY MOUTH EVERY 8 HOURS AS NEEDED FOR NAUSEA OR VOMITING   oxyCODONE (OXY IR/ROXICODONE) 5 MG immediate release tablet Take 1 tablet (5 mg total) by mouth every 8 (eight) hours as needed for severe pain.   pantoprazole (PROTONIX) 40 MG tablet Pantoprazole 40 mg every morning for 8 weeks.   pravastatin (PRAVACHOL) 80 MG tablet Take 1 tablet (80 mg total) by mouth daily.   predniSONE (DELTASONE) 50 MG tablet Take Prednisone 50 mg  13 hr,  7 hr,  And  1 hr before MRI scan  As directed.   prochlorperazine (COMPAZINE) 10 MG tablet Take 1 tablet (10 mg total) by mouth every 6 (six) hours as needed for nausea or vomiting.   senna (SENOKOT) 8.6 MG tablet Take 1 tablet by mouth as needed for constipation.   Sofosbuvir-Velpatasvir (EPCLUSA) 400-100 MG TABS Take 1 tablet by mouth daily.   Syringe, Disposable, (B-D 30CC SYRINGE) 30 ML MISC 1 Syringe by Does not apply route as directed.   No facility-administered encounter medications on file as of 06/06/2019.     REVIEW OF SYSTEMS  : All other systems reviewed and negative except where noted in the History of Present Illness.   PHYSICAL EXAM: There were no vitals taken for this visit. General: 53 year old male in no  acute distress. Head: Normocephalic and atraumatic Eyes:  sclerae anicteric,conjunctive pink. Ears: Normal auditory acuity. Mouth: Poor dentition, no ulcers or lesions. Neck: Supple, no masses.  Lungs: Few fine crackles to the right lower base otherwise breath sounds clear throughout. Heart: Regular rate and rhythm Abdomen: Soft, generalized tenderness without rebound or guarding, his abdomen is not tense.  Positive bowel sounds  to all 4 quadrants.  No ascites.  No HSM appreciated. Rectal: Thickened anal folds.  Internal posterior exam with palpable sentinel tag without obvious fissure, the anal rectal mucosa is edematous, scant amount of red blood on the exam glove, no stool in the rectal vault. His significant other was present during exam. Musculoskeletal: Symmetrical with no gross deformities  Skin: No lesions on visible extremities Extremities: No edema  Neurological: Alert oriented x 4, grossly nonfocal Cervical Nodes:  No significant cervical adenopathy Psychological:  Alert and cooperative. Normal mood and affect  ASSESSMENT AND PLAN:  28. 53 year old male with chronic hepatitis C GT1a with cirrhosis and HCC with persistent generalized abdominal pain and hematochezia most likely due to chemotherapy and immunotherapy. CTAP 05/17/2019 showed constipation, no evidence of colitis and mixed appearance in the liver, some lesions are significantly smaller other lesions have enlarged. He is afebrile.  -Repeat CBC today -Patient to proceed with a diagnostic colonoscopy as scheduled 06/09/2019 with Dr. Payton Emerald, Covid 19 testing done today, results pending -Further recommendations to be determined after colonoscopy completed -Push fluids, bland diet -Apply a small amount of Desitin inside the anal area 2-3 times daily as needed.  Patient instructed NOT to use Desitin Friday a.m. of procedure. -Patient to present to the local emergency room if he develops worsening abdominal pain or rectal  bleeding  -check c.diff PCR If passing only watery diarrhea   2.  H. pylori gastritis, resolved  3. DM II        CC:  Lanae Boast, FNP

## 2019-06-06 NOTE — Progress Notes (Signed)
06/06/2019 [name] [id number] 29-Oct-1965   HISTORY OF PRESENT ILLNESS: [name] is a 53 year old male with a past medical history of hypertensin, DM II, chronic hepatitis C GT1a with cirrhosis  and hepatocellular carcinoma stage IIIA initially diagnosed 12/2017.  Treatment with Raeanne Gathers was previously considered but was not initiated due to his active [place].  He is followed by oncologist, Dr. Truitt Merle. Status post TACE Chemo ebolization on 03/24/18 with Dr. Kathlene Cote. Atezolizumab and Bevacizumab Q 3 weeks.  His last infusions were scheduled on 06/01/2019 but were canceled due to the patient's complaint of worsening hematochezia.  He is passing 3-6 bowel movements described as some water with solid small stool mixed with bright red blood which has been his typical bowel pattern since starting chemotherapy and immunotherapy.  He has 2-3 trips to the bathroom when he passes only bright red blood per the rectum.  At times, the blood turns the toilet water completely red.  He has generalized abdominal pain and lower rectal pain during defecation.  He also tells me he has chronic generalized abdominal pain which has not changed since his most recent abdominal/pelvic CT was done 05/17/2019.  See his CT results below.  No fevers or chills.  He denies passing any exudate per the rectum.  CBC 10/29 WBC 4.1. Hg 15.4. He is using Preparation H and Anusol suppositories PRN.  He is scheduled for a diagnostic colonoscopy with Dr. Tarri Glenn Friday, 06/09/2019.  His Covid testing was done today, the results are pending.  He denies taking any further Diclofenac,  no other NSAIDs.  He last took Prednisone prior to his recent CT scan. He underwent an EGD to rule out esophageal varices by Dr. Tarri Glenn on 03/02/2019. The EGD identified H. Pylori gastritis, no evidence of esophageal varices or portal hypertensive gastropathy. H.Pylori was treated with Amoxicillin/Clarithromycin and Pantoprazole.  A posttreatment H. pylori  stool antigen 05/12/2019 was negative. He remains on pantoprazole 40 mg once daily.  No dysphagia or heartburn. Father with history of liver disease. No family history of colorectal cancer. His significant other is present.   EGD 03/02/2019 by Dr. Tarri Glenn  -Irregular z-line suspicious for Barrett's esophagus. - Non-bleeding erosive gastropathy and suspected gastritis. Biopsied - Small hiatal hernia. - Normal examined duodenum. - No varices or portal hypertensive gastropathy present  CBC Latest Ref Rng & Units 06/01/2019 05/17/2019 04/27/2019  WBC 4.0 - 10.5 K/uL 4.1 3.5(L) 3.2(L)  Hemoglobin 13.0 - 17.0 g/dL 15.4 15.9 15.7  Hematocrit 39.0 - 52.0 % 45.5 48.8 47.6  Platelets 150 - 400 K/uL 259 221 226   CMP Latest Ref Rng & Units 06/01/2019 05/17/2019 04/27/2019  Glucose 70 - 99 mg/dL 171(H) 222(H) 259(H)  BUN 6 - 20 mg/dL 10 17 14   Creatinine 0.61 - 1.24 mg/dL 0.95 1.08 1.15  Sodium 135 - 145 mmol/L 137 142 138  Potassium 3.5 - 5.1 mmol/L 4.2 4.2 4.6  Chloride 98 - 111 mmol/L 102 103 103  CO2 22 - 32 mmol/L 25 23 27   Calcium 8.9 - 10.3 mg/dL 9.7 9.9 9.6  Total Protein 6.5 - 8.1 g/dL 7.3 7.9 7.5  Total Bilirubin 0.3 - 1.2 mg/dL 1.3(H) 1.2 0.9  Alkaline Phos 38 - 126 U/L 151(H) 135(H) 127(H)  AST 15 - 41 U/L 57(H) 52(H) 58(H)  ALT 0 - 44 U/L 37 39 43   Lab Results  Component Value Date   AFPTM 27.1 (H) 01/02/2019   AFPTM 12.0 (H) 12/09/2017  Abdominal/pelvic CT 05/17/2019: 1. Mixed appearance in the liver, some lesions are significantly smaller other lesions have enlarged. In terms of overall metastatic burden this essentially balances out. 2. Mildly enlarged peripancreatic lymph node, not well seen previously. 3. Small gallstones. 4.  Prominent stool throughout the colon favors constipation. 5. Cirrhosis likely with geographic fibrosis.  Abdominal MRI with and without contrast 02/08/2019: 1. Today's study demonstrates a mixed response to therapy. Overall, most lesions are  decreased in size compared to the prior examination, with the exception of 2 new lesions in the liver, as detailed above. Findings remain most compatible with multifocal hepatocellular carcinoma. 2. Hepatic steatosis and hepatic cirrhosis.   Ultrasound Hepatic Elastography 12/07/2017: Median hepatic shear wave velocity is calculated at 1.52 m/sec. Corresponding Metavir fibrosis score is F2 + some F3. Risk of fibrosis is moderate.  Past Medical History:  Diagnosis Date   Chest pain    pt states when he turned over on other side aided in relief   Diabetes mellitus    Dyspnea    increased activity   Elevated cholesterol    Hepatitis C    Hypertension    Liver cancer ([place]) 2019   Past Surgical History:  Procedure Laterality Date   I&D EXTREMITY  06/28/2011   Procedure: IRRIGATION AND DEBRIDEMENT EXTREMITY;  Surgeon: Linna Hoff;  Location: [facility];  Service: Orthopedics;  Laterality: Left;  with application of wound vac   INCISION AND DRAINAGE OF WOUND  07/02/2011   Procedure: IRRIGATION AND DEBRIDEMENT WOUND;  Surgeon: Linna Hoff;  Location: [facility];  Service: Orthopedics;  Laterality: Left;  Irrigation and Debridement of left arm wound with  Skin Grafting from left thigh    IR ANGIOGRAM SELECTIVE EACH ADDITIONAL VESSEL  03/18/2018   IR ANGIOGRAM SELECTIVE EACH ADDITIONAL VESSEL  08/26/2018   IR ANGIOGRAM SELECTIVE EACH ADDITIONAL VESSEL  08/26/2018   IR ANGIOGRAM SELECTIVE EACH ADDITIONAL VESSEL  08/26/2018   IR ANGIOGRAM SELECTIVE EACH ADDITIONAL VESSEL  08/26/2018   IR ANGIOGRAM SELECTIVE EACH ADDITIONAL VESSEL  09/13/2018   IR ANGIOGRAM SELECTIVE EACH ADDITIONAL VESSEL  01/09/2019   IR ANGIOGRAM VISCERAL SELECTIVE  03/18/2018   IR ANGIOGRAM VISCERAL SELECTIVE  03/18/2018   IR ANGIOGRAM VISCERAL SELECTIVE  08/26/2018   IR ANGIOGRAM VISCERAL SELECTIVE  08/26/2018   IR ANGIOGRAM VISCERAL SELECTIVE  09/13/2018   IR ANGIOGRAM VISCERAL SELECTIVE  01/09/2019   IR  EMBO ARTERIAL NOT HEMORR HEMANG INC GUIDE ROADMAPPING  08/26/2018   IR EMBO TUMOR ORGAN ISCHEMIA INFARCT INC GUIDE ROADMAPPING  03/18/2018   IR EMBO TUMOR ORGAN ISCHEMIA INFARCT INC GUIDE ROADMAPPING  09/13/2018   IR EMBO TUMOR ORGAN ISCHEMIA INFARCT INC GUIDE ROADMAPPING  01/09/2019   IR RADIOLOGIST EVAL & MGMT  02/16/2018   IR RADIOLOGIST EVAL & MGMT  07/12/2018   IR US GUIDE VASC ACCESS RIGHT  03/18/2018   IR US GUIDE VASC ACCESS RIGHT  08/26/2018   IR US GUIDE VASC ACCESS RIGHT  09/13/2018   IR US GUIDE VASC ACCESS RIGHT  01/09/2019   JOINT REPLACEMENT     PARTIAL HIP ARTHROPLASTY      reports that he has been smoking cigarettes. He has a 40.00 pack-year smoking history. He has never used smokeless tobacco. He reports current alcohol use of about 40.0 - 41.0 standard drinks of alcohol per week. He reports that he does not use drugs. family history includes Diabetes in his mother and another family member; Hypertension in an other family member; Liver disease  in his father. Allergies  Allergen Reactions   Gadolinium Derivatives Hives and Itching    Pt after contrast injection. Immediately complained of total body itching and hives. Pt was pulled out of the scanner and given IV benadryl per ER MD.    Multihance [Gadobenate] Itching    1-2 minutes into contrast infusion, patient itching all over face, neck and chest requiring IV benadryl       Outpatient Encounter Medications as of 06/06/2019  Medication Sig   cyclobenzaprine (FLEXERIL) 10 MG tablet Take 1/2 to 1 whole tablet by mouth every 8 hours as needed for muscle spasms.   diclofenac (VOLTAREN) 75 MG EC tablet Take 1 tablet (75 mg total) by mouth 2 (two) times daily as needed for moderate pain.   gabapentin (NEURONTIN) 300 MG capsule Take 1 capsule (300 mg total) by mouth 3 (three) times daily.   glipiZIDE (GLUCOTROL) 10 MG tablet TAKE 1 TABLET(10 MG) BY MOUTH TWICE DAILY BEFORE A MEAL   Glucerna (GLUCERNA) LIQD Take 1 Can  by mouth 3 (three) times daily between meals.   glucose blood test strip Check blood sugar 3 times per day prior to meals. Use as instructed   hydrochlorothiazide (HYDRODIURIL) 25 MG tablet TAKE 1 TABLET(25 MG) BY MOUTH DAILY (Patient taking differently: Take 25 mg by mouth daily. )   insulin glargine (LANTUS) 100 UNIT/ML injection Inject 0.3 mLs (30 Units total) into the skin at bedtime.   Insulin Pen Needle (PEN NEEDLES) 32G X 4 MM MISC 1 packet by Does not apply route 3 (three) times daily as needed.   lisinopril (PRINIVIL,ZESTRIL) 20 MG tablet Take 1 tablet (20 mg total) by mouth daily.   metFORMIN (GLUCOPHAGE) 1000 MG tablet TAKE 1 TABLET(1000 MG) BY MOUTH TWICE DAILY   ondansetron (ZOFRAN) 4 MG tablet TAKE 1 TABLET(4 MG) BY MOUTH EVERY 8 HOURS AS NEEDED FOR NAUSEA OR VOMITING   oxyCODONE (OXY IR/ROXICODONE) 5 MG immediate release tablet Take 1 tablet (5 mg total) by mouth every 8 (eight) hours as needed for severe pain.   pantoprazole (PROTONIX) 40 MG tablet Pantoprazole 40 mg every morning for 8 weeks.   pravastatin (PRAVACHOL) 80 MG tablet Take 1 tablet (80 mg total) by mouth daily.   predniSONE (DELTASONE) 50 MG tablet Take Prednisone 50 mg  13 hr,  7 hr,  And  1 hr before MRI scan  As directed.   prochlorperazine (COMPAZINE) 10 MG tablet Take 1 tablet (10 mg total) by mouth every 6 (six) hours as needed for nausea or vomiting.   senna (SENOKOT) 8.6 MG tablet Take 1 tablet by mouth as needed for constipation.   Sofosbuvir-Velpatasvir (EPCLUSA) 400-100 MG TABS Take 1 tablet by mouth daily.   Syringe, Disposable, (B-D 30CC SYRINGE) 30 ML MISC 1 Syringe by Does not apply route as directed.   No facility-administered encounter medications on file as of 06/06/2019.     REVIEW OF SYSTEMS  : All other systems reviewed and negative except where noted in the History of Present Illness.   PHYSICAL EXAM: There were no vitals taken for this visit. General: 53 year old male in no  acute distress. Head: Normocephalic and atraumatic Eyes:  sclerae anicteric,conjunctive pink. Ears: Normal auditory acuity. Mouth: Poor dentition, no ulcers or lesions. Neck: Supple, no masses.  Lungs: Few fine crackles to the right lower base otherwise breath sounds clear throughout. Heart: Regular rate and rhythm Abdomen: Soft, generalized tenderness without rebound or guarding, his abdomen is not tense.  Positive bowel sounds  to all 4 quadrants.  No ascites.  No HSM appreciated. Rectal: Thickened anal folds.  Internal posterior exam with palpable sentinel tag without obvious fissure, the anal rectal mucosa is edematous, scant amount of red blood on the exam glove, no stool in the rectal vault. His significant other was present during exam. Musculoskeletal: Symmetrical with no gross deformities  Skin: No lesions on visible extremities Extremities: No edema  Neurological: Alert oriented x 4, grossly nonfocal Cervical Nodes:  No significant cervical adenopathy Psychological:  Alert and cooperative. Normal mood and affect  ASSESSMENT AND PLAN:  55. 53 year old male with chronic hepatitis C GT1a with cirrhosis and HCC with persistent generalized abdominal pain and hematochezia most likely due to chemotherapy and immunotherapy. CTAP 05/17/2019 showed constipation, no evidence of colitis and mixed appearance in the liver, some lesions are significantly smaller other lesions have enlarged. He is afebrile.  -Repeat CBC today -Patient to proceed with a diagnostic colonoscopy as scheduled 06/09/2019 with Dr. Payton Emerald, Covid 19 testing done today, results pending -Further recommendations to be determined after colonoscopy completed -Push fluids, bland diet -Apply a small amount of Desitin inside the anal area 2-3 times daily as needed.  Patient instructed NOT to use Desitin Friday a.m. of procedure. -Patient to present to the local emergency room if he develops worsening abdominal pain or rectal  bleeding  -check c.diff PCR If passing only watery diarrhea   2.  H. pylori gastritis, resolved  3. DM II        CC:  Lanae Boast, FNP

## 2019-06-06 NOTE — Patient Instructions (Addendum)
You have been scheduled for a colonoscopy. Please follow written instructions given to you at your visit today.  Please pick up your prep supplies at the pharmacy within the next 1-3 days. If you use inhalers (even only as needed), please bring them with you on the day of your procedure.  We have sent the following medications to your pharmacy for you to pick up at your convenience: suprep   Apply a small amount of Desitin inside the anal opening 3 times a day as needed. This is over the counter.  DO NOT USE DESITIN FRIDAY MORNING.  Your provider has requested that you go to the basement level for lab work before leaving today. Press "B" on the elevator. The lab is located at the first door on the left as you exit the elevator.   Go to the ED if you have worsening rectal bleeding.   I appreciate the opportunity to care for you. [place]

## 2019-06-06 NOTE — Progress Notes (Signed)
Reviewed. I agree with documentation including the assessment and plan. Diarrhea is likely related to chemotherapy and immunotherapy.   Would recommend a GI pathogen panel to exclude infectious etiologies prior to colonoscopy to evaluate the cause of his diarrhea.   Porchea Charrier L. Tarri Glenn, MD, MPH

## 2019-06-07 ENCOUNTER — Other Ambulatory Visit: Payer: Self-pay | Admitting: *Deleted

## 2019-06-07 ENCOUNTER — Telehealth: Payer: Self-pay | Admitting: *Deleted

## 2019-06-07 DIAGNOSIS — R197 Diarrhea, unspecified: Secondary | ICD-10-CM

## 2019-06-07 LAB — NOVEL CORONAVIRUS, NAA (HOSP ORDER, SEND-OUT TO REF LAB; TAT 18-24 HRS): SARS-CoV-2, NAA: NOT DETECTED

## 2019-06-07 NOTE — Telephone Encounter (Signed)
I am aware of colonoscopy on Friday.  But he should have the stool test done as we need the results, even if not available prior to his endoscopy.  Thank you.

## 2019-06-07 NOTE — Telephone Encounter (Signed)
Noted! Thank you

## 2019-06-07 NOTE — Telephone Encounter (Signed)
FYI- Spoke to the patient who was highly upset about being asked to come in for a GI path panel yelling "[facility] already had me do that!" He reports he did not understand why he had to come in for a stool test being that he does not have diarrhea, reporting "It's just blood coming out! I don't have diarrhea!" The patient stated he "did not feel good" and would not be in today. He said he would come in tomorrow, unsure of whether the patient will or not. Orders placed in Epic if the patient decides to come in to the lab for the stool test.

## 2019-06-07 NOTE — Telephone Encounter (Signed)
Reviewed. I agree with documentation including the assessment and plan. Diarrhea is likely related to chemotherapy and immunotherapy.   Would recommend a GI pathogen panel to exclude infectious etiologies prior to colonoscopy to evaluate the cause of his diarrhea.   Kimberly L. Tarri Glenn, MD, MPH -----------------------------------------------------------------------  GI Path panel order placed in Epic.  Dr. Tarri Glenn, this patient's colon in the hospital is on 11/6. We will not have results for the GI path panel in time as this is a send out lab. Do you want his colon to be rescheduled. Your next hospital day is 11/16. Do you want the patient rescheduled? Please advise.

## 2019-06-08 ENCOUNTER — Ambulatory Visit: Payer: Medicare Other

## 2019-06-08 ENCOUNTER — Other Ambulatory Visit: Payer: Medicare Other

## 2019-06-08 ENCOUNTER — Encounter (HOSPITAL_COMMUNITY): Payer: Self-pay | Admitting: *Deleted

## 2019-06-08 ENCOUNTER — Ambulatory Visit: Payer: Medicare Other | Admitting: Hematology

## 2019-06-08 DIAGNOSIS — R197 Diarrhea, unspecified: Secondary | ICD-10-CM

## 2019-06-09 ENCOUNTER — Ambulatory Visit (HOSPITAL_COMMUNITY)
Admission: RE | Admit: 2019-06-09 | Discharge: 2019-06-09 | Disposition: A | Discharge status: Home / Self Care | Payer: Medicare Other | Attending: Gastroenterology | Admitting: Gastroenterology

## 2019-06-09 ENCOUNTER — Ambulatory Visit (HOSPITAL_COMMUNITY): Payer: Medicare Other | Admitting: Certified Registered"

## 2019-06-09 ENCOUNTER — Telehealth: Payer: Self-pay | Admitting: Gastroenterology

## 2019-06-09 ENCOUNTER — Other Ambulatory Visit: Payer: Self-pay

## 2019-06-09 ENCOUNTER — Encounter (HOSPITAL_COMMUNITY): Payer: Self-pay

## 2019-06-09 ENCOUNTER — Encounter (HOSPITAL_COMMUNITY)
Admission: RE | Disposition: A | Discharge status: Home / Self Care | Payer: Self-pay | Source: Home / Self Care | Attending: Gastroenterology

## 2019-06-09 DIAGNOSIS — K633 Ulcer of intestine: Secondary | ICD-10-CM

## 2019-06-09 DIAGNOSIS — K529 Noninfective gastroenteritis and colitis, unspecified: Secondary | ICD-10-CM

## 2019-06-09 DIAGNOSIS — E119 Type 2 diabetes mellitus without complications: Secondary | ICD-10-CM | POA: Insufficient documentation

## 2019-06-09 DIAGNOSIS — K5190 Ulcerative colitis, unspecified, without complications: Secondary | ICD-10-CM | POA: Insufficient documentation

## 2019-06-09 DIAGNOSIS — I10 Essential (primary) hypertension: Secondary | ICD-10-CM | POA: Diagnosis not present

## 2019-06-09 DIAGNOSIS — Z9221 Personal history of antineoplastic chemotherapy: Secondary | ICD-10-CM | POA: Insufficient documentation

## 2019-06-09 DIAGNOSIS — Z794 Long term (current) use of insulin: Secondary | ICD-10-CM | POA: Diagnosis not present

## 2019-06-09 DIAGNOSIS — Z87891 Personal history of nicotine dependence: Secondary | ICD-10-CM | POA: Diagnosis not present

## 2019-06-09 DIAGNOSIS — K7460 Unspecified cirrhosis of liver: Secondary | ICD-10-CM | POA: Diagnosis not present

## 2019-06-09 DIAGNOSIS — Z8719 Personal history of other diseases of the digestive system: Secondary | ICD-10-CM | POA: Diagnosis not present

## 2019-06-09 DIAGNOSIS — K625 Hemorrhage of anus and rectum: Secondary | ICD-10-CM | POA: Diagnosis present

## 2019-06-09 DIAGNOSIS — C220 Liver cell carcinoma: Secondary | ICD-10-CM | POA: Diagnosis not present

## 2019-06-09 DIAGNOSIS — Z79899 Other long term (current) drug therapy: Secondary | ICD-10-CM | POA: Diagnosis not present

## 2019-06-09 DIAGNOSIS — R1084 Generalized abdominal pain: Secondary | ICD-10-CM | POA: Diagnosis not present

## 2019-06-09 DIAGNOSIS — B182 Chronic viral hepatitis C: Secondary | ICD-10-CM | POA: Diagnosis not present

## 2019-06-09 HISTORY — PX: BIOPSY: SHX5522

## 2019-06-09 HISTORY — PX: COLONOSCOPY WITH PROPOFOL: SHX5780

## 2019-06-09 LAB — GASTROINTESTINAL PATHOGEN PANEL PCR
C. difficile Tox A/B, PCR: NOT DETECTED
Campylobacter, PCR: NOT DETECTED
Cryptosporidium, PCR: NOT DETECTED
E coli (ETEC) LT/ST PCR: NOT DETECTED
E coli (STEC) stx1/stx2, PCR: NOT DETECTED
E coli 0157, PCR: NOT DETECTED
Giardia lamblia, PCR: NOT DETECTED
Norovirus, PCR: NOT DETECTED
Rotavirus A, PCR: NOT DETECTED
Salmonella, PCR: NOT DETECTED
Shigella, PCR: NOT DETECTED

## 2019-06-09 LAB — GLUCOSE, CAPILLARY: Glucose-Capillary: 125 mg/dL — ABNORMAL HIGH (ref 70–99)

## 2019-06-09 SURGERY — COLONOSCOPY WITH PROPOFOL
Anesthesia: Monitor Anesthesia Care

## 2019-06-09 MED ORDER — MESALAMINE 4 G RE ENEM: 4.0000 g | ENEMA | Freq: Two times a day (BID) | RECTAL | 3 refills | Status: DC

## 2019-06-09 MED ORDER — SODIUM CHLORIDE 0.9 % IV SOLN
INTRAVENOUS | Status: DC
  Administered 2019-06-09: 1000 mL via INTRAVENOUS

## 2019-06-09 MED ORDER — PROPOFOL 500 MG/50ML IV EMUL
INTRAVENOUS | Status: DC | PRN
  Administered 2019-06-09: 135 ug/kg/min via INTRAVENOUS

## 2019-06-09 MED ORDER — PROPOFOL 500 MG/50ML IV EMUL
INTRAVENOUS | Status: AC
  Filled 2019-06-09: qty 50

## 2019-06-09 MED ORDER — PROPOFOL 10 MG/ML IV BOLUS
INTRAVENOUS | Status: AC
  Filled 2019-06-09: qty 20

## 2019-06-09 MED ORDER — LACTATED RINGERS IV SOLN
INTRAVENOUS | Status: DC
  Administered 2019-06-09: 11:00:00 via INTRAVENOUS

## 2019-06-09 MED ORDER — PROPOFOL 10 MG/ML IV BOLUS
INTRAVENOUS | Status: DC | PRN
  Administered 2019-06-09 (×2): 20 mg via INTRAVENOUS

## 2019-06-09 SURGICAL SUPPLY — 22 items

## 2019-06-09 NOTE — Op Note (Addendum)
[facility] Patient Name: [name] Procedure Date: 06/09/2019 MRN: [id number] Attending MD: Thornton Park MD, MD Date of Birth: 07/12/1966 CSN: [id number] Age: 53 Admit Type: Outpatient Procedure:                Colonoscopy Indications:              Rectal bleeding Providers:                Thornton Park MD, MD, Truddie Coco, RN, Elspeth Cho Tech., Technician, Theressa Millard, CRNA Referring MD:              Medicines:                Monitored Anesthesia Care Complications:            No immediate complications. Estimated blood loss:                            Minimal. Estimated Blood Loss:     Estimated blood loss was minimal. Procedure:                Pre-Anesthesia Assessment:                           - Prior to the procedure, a History and Physical                            was performed, and patient medications and                            allergies were reviewed. The patient's tolerance of                            previous anesthesia was also reviewed. The risks                            and benefits of the procedure and the sedation                            options and risks were discussed with the patient.                            All questions were answered, and informed consent                            was obtained. Prior Anticoagulants: The patient has                            taken no previous anticoagulant or antiplatelet                            agents. ASA Grade Assessment: [name] - A patient with  severe systemic disease. After reviewing the risks                            and benefits, the patient was deemed in                            satisfactory condition to undergo the procedure.                           After obtaining informed consent, the colonoscope                            was passed under direct vision. Throughout the                            procedure, the  patient's blood pressure, pulse, and                            oxygen saturations were monitored continuously. The                            PCF-H190DL ([id number]) Olympus pediatric colonscope                            was introduced through the anus and advanced to the                            the terminal ileum, with identification of the                            appendiceal orifice and IC valve. A second forward                            view of the right colon was performed. The                            colonoscopy was performed without difficulty. The                            patient tolerated the procedure well. The quality                            of the bowel preparation was good. The terminal                            ileum, ileocecal valve, appendiceal orifice, and                            rectum were photographed. Scope In: 11:31:46 AM Scope Out: 11:44:57 AM Scope Withdrawal Time: 0 hours 8 minutes 58 seconds  Total Procedure Duration: 0 hours 13 minutes 11 seconds  Findings:      The perianal and digital rectal examinations were normal.      A continuous area of bleeding ulcerated mucosa with  stigmata of recent       bleeding was present in the rectum. It extends to 20 cm from the anal       verge. Biopsies were taken with a cold forceps for histology. Estimated       blood loss was minimal.      The remainder of the examined colonic mucosa appeared normal. Biopsies       were taken with a cold forceps for histology. Estimated blood loss was       minimal.      The terminal ileum appeared normal.      The exam was otherwise without abnormality on direct and retroflexion       views. Impression:               - Mucosal ulceration. Biopsied.                           - The entire examined colon is normal. Biopsied.                           - The examined portion of the ileum was normal.                           - The examination was otherwise normal on direct                             and retroflexion views. Moderate Sedation:      Not Applicable - Patient had care per Anesthesia. Recommendation:           - Patient has a contact number available for                            emergencies. The signs and symptoms of potential                            delayed complications were discussed with the                            patient. Return to normal activities tomorrow.                            Written discharge instructions were provided to the                            patient.                           - Resume previous diet today.                           - Await biopsy results.                           - Use Rowasa enemas 1 per rectum BID for 2 weeks.                           - Follow-up at [facility] with  Dr. Tarri Glenn or NP                            Berniece Pap to review the response to treatment. Procedure Code(s):        --- Professional ---                           [id number], Colonoscopy, flexible; with biopsy, single                            or multiple Diagnosis Code(s):        --- Professional ---                           K63.3, Ulcer of intestine                           K62.5, Hemorrhage of anus and rectum CPT copyright 2019 [employer]. All rights reserved. The codes documented in this report are preliminary and upon coder review may  be revised to meet current compliance requirements. Thornton Park MD, MD 06/09/2019 12:03:34 PM This report has been signed electronically. Number of Addenda: 0

## 2019-06-09 NOTE — Interval H&P Note (Signed)
History and Physical Interval Note:  06/09/2019 10:25 AM  [name]  has presented today for surgery, with the diagnosis of rectal bleeding/db.  The various methods of treatment have been discussed with the patient and family. After consideration of risks, benefits and other options for treatment, the patient has consented to  Procedure(s): COLONOSCOPY WITH PROPOFOL (N/A) as a surgical intervention.  The patient's history has been reviewed, patient examined, no change in status, stable for surgery.  I have reviewed the patient's chart and labs.  Questions were answered to the patient's satisfaction.     Thornton Park

## 2019-06-09 NOTE — Transfer of Care (Signed)
Immediate Anesthesia Transfer of Care Note  Patient: [name]  Procedure(s) Performed: COLONOSCOPY WITH PROPOFOL (N/A )  Patient Location: PACU  Anesthesia Type:MAC  Level of Consciousness: awake, alert  and oriented  Airway & Oxygen Therapy: Patient Spontanous Breathing and Patient connected to face mask oxygen  Post-op Assessment: Report given to RN, Post -op Vital signs reviewed and stable and Patient moving all extremities X 4  Post vital signs: Reviewed and stable  Last Vitals:  Vitals Value Taken Time  BP    Temp    Pulse 84 06/09/19 1152  Resp 20 06/09/19 1152  SpO2 100 % 06/09/19 1152  Vitals shown include unvalidated device data.  Last Pain:  Vitals:   06/09/19 1022  TempSrc: Oral  PainSc: 0-No pain         Complications: No apparent anesthesia complications

## 2019-06-09 NOTE — Telephone Encounter (Signed)
What alternative will his insurance cover? Thank you.

## 2019-06-09 NOTE — Anesthesia Preprocedure Evaluation (Signed)
Anesthesia Evaluation  Patient identified by MRN, date of birth, ID band Patient awake    Reviewed: Allergy & Precautions, NPO status , Patient's Chart, lab work & pertinent test results  Airway Mallampati: II  TM Distance: >3 FB Neck ROM: Full    Dental no notable dental hx.    Pulmonary neg pulmonary ROS, Current Smoker and Patient abstained from smoking.,    Pulmonary exam normal breath sounds clear to auscultation       Cardiovascular hypertension, Pt. on medications Normal cardiovascular exam Rhythm:Regular Rate:Normal     Neuro/Psych negative neurological ROS  negative psych ROS   GI/Hepatic negative GI ROS, (+) Hepatitis -Liver Ca   Endo/Other  diabetes, Insulin DependentLong term steroid use  Renal/GU negative Renal ROS  negative genitourinary   Musculoskeletal negative musculoskeletal ROS (+)   Abdominal   Peds negative pediatric ROS (+)  Hematology negative hematology ROS (+)   Anesthesia Other Findings   Reproductive/Obstetrics negative OB ROS                             Anesthesia Physical Anesthesia Plan  ASA: [name]  Anesthesia Plan: MAC   Post-op Pain Management:    Induction: Intravenous  PONV Risk Score and Plan:   Airway Management Planned: Simple Face Mask  Additional Equipment:   Intra-op Plan:   Post-operative Plan:   Informed Consent: I have reviewed the patients History and Physical, chart, labs and discussed the procedure including the risks, benefits and alternatives for the proposed anesthesia with the patient or authorized representative who has indicated his/her understanding and acceptance.     Dental advisory given  Plan Discussed with: CRNA and Surgeon  Anesthesia Plan Comments:         Anesthesia Quick Evaluation

## 2019-06-09 NOTE — Telephone Encounter (Signed)
Mesalamine enemas are not covered by patients insurance, please advise an alternative.

## 2019-06-09 NOTE — Discharge Instructions (Signed)

## 2019-06-09 NOTE — Anesthesia Procedure Notes (Signed)
Procedure Name: MAC Date/Time: 06/09/2019 11:25 AM Performed by: Niel Hummer, CRNA Pre-anesthesia Checklist: Patient identified, Emergency Drugs available, Suction available and Patient being monitored Patient Re-evaluated:Patient Re-evaluated prior to induction Oxygen Delivery Method: Simple face mask

## 2019-06-09 NOTE — Telephone Encounter (Signed)
Thank you :)

## 2019-06-09 NOTE — Anesthesia Postprocedure Evaluation (Signed)
Anesthesia Post Note  Patient: ISAIAS DOWSON  Procedure(s) Performed: COLONOSCOPY WITH PROPOFOL (N/A )     Patient location during evaluation: PACU Anesthesia Type: MAC Level of consciousness: awake and alert Pain management: pain level controlled Vital Signs Assessment: post-procedure vital signs reviewed and stable Respiratory status: spontaneous breathing, nonlabored ventilation, respiratory function stable and patient connected to nasal cannula oxygen Cardiovascular status: stable and blood pressure returned to baseline Postop Assessment: no apparent nausea or vomiting Anesthetic complications: no    Last Vitals:  Vitals:   06/09/19 1200 06/09/19 1210  BP: (!) 159/95 (!) 149/88  Pulse: 85 85  Resp: (!) 21 (!) 22  Temp:    SpO2: 99% 98%    Last Pain:  Vitals:   06/09/19 1200  TempSrc:   PainSc: 0-No pain                 Raeshawn Tafolla S

## 2019-06-09 NOTE — Telephone Encounter (Signed)
Spoke with pharmacy and she was able to determine there was an error in prescribing and they had a different quantity. Corrected the quantity and ran it through patients insurance again and it will only cost him $1.30. Patient informed and he will go pick up prescription.

## 2019-06-12 ENCOUNTER — Telehealth: Payer: Self-pay | Admitting: *Deleted

## 2019-06-12 LAB — SURGICAL PATHOLOGY

## 2019-06-12 NOTE — Telephone Encounter (Signed)
Spoke to the patient and notified the patient of his f/u appt with Dr. Tarri Glenn on 11/16 at 3:40 pm. Patient reported he would be here for his f/u appt.

## 2019-06-13 NOTE — Progress Notes (Deleted)
[place]   [phone] Fax:([phone]) [phone]   Clinic Follow up Note   Patient Care Team: Rocco Serene, MD as PCP - General (Internal Medicine) Comer, Okey Regal, MD as Consulting Physician (Infectious Diseases) Truitt Merle, MD as Consulting Physician (Hematology) 06/13/2019  CHIEF COMPLAINT: F/u [place]  SUMMARY OF ONCOLOGIC HISTORY: Oncology History  Hepatocellular carcinoma ([place])  12/07/2017 Initial Diagnosis   Hepatocellular carcinoma ([place])   12/07/2017 Imaging   US Abdomen 12/07/17 IMPRESSION: ULTRASOUND ABDOMEN: Probable 4 mm gallstone in gallbladder.  Multiple hepatic masses largest heterogeneous RIGHT lobe measuring 6.1 x 5.2 x 5.5 cm; metastatic disease and primary tumor not excluded, recommend further evaluation by MR imaging.  ULTRASOUND HEPATIC ELASTOGRAPHY:  Median hepatic shear wave velocity is calculated at 1.52 m/sec.  Corresponding Metavir fibrosis score is F2 + some F3.  Risk of fibrosis is moderate.  Follow-up: Additional testing appropriate   12/22/2017 PET scan   PET 12/22/17  IMPRESSION: 1. Lesions within the liver have low metabolic activity for size. 2. Dominant lesion in liver does have a focus of metabolic activity which corresponds to a low-density rounded region within the larger lesion. Target this 2 cm focus within the dominant lesion for biopsy. The liver lesions remain concerning for metastatic disease or primary liver carcinoma. Mucinous or cystic carcinomas can have low metabolic activity. 3. Focus of metabolic activity at the level of the hepatic flexure of the colon with some soft tissue thickening not clearly localized. Some additional foci of uptake within colon. This could represent physiologic activity however depending on the biopsy results of the liver recommend colonoscopy.   12/23/2017 Cancer Staging   Staging form: Liver, AJCC 8th Edition - Clinical stage from 12/23/2017: Stage IIIA (cT3, cN0, cM0)  - Signed by Truitt Merle, MD on 12/30/2017   12/23/2017 Initial Biopsy   Diagnosis 12/23/17 Liver, needle/core biopsy, Right lobe - HEPATOCELLULAR CARCINOMA. Microscopic Comment Dr. Lyndon Code has reviewed the case. Dr. Burr Medico was paged on 12/24/2017.     02/18/2018 Imaging   CT AP W WO Contrast 02/18/18  IMPRESSION: 1. Multifocal hepatocellular carcinoma, mildly progressive from baseline MRI 12/10/2017. 2. No evidence of extrahepatic nodal metastases or vascular involvement. 3. Borderline hepatic steatosis. No morphologic changes of underlying cirrhosis or portal hypertension. 4. Cholelithiasis. 5. Mild Aortic Atherosclerosis (ICD10-I70.0).   03/24/2018 Procedure   TACE Chemo ebolization on 03/24/18 with Dr. Kathlene Cote    06/14/2018 Imaging   MRI abdomen 06/14/18  IMPRESSION: 1. Dominant lesion in the central LEFT hepatic lobe (segment 4A) is stable in size, decreased in central enhancement, and with persistent nodular wall enhancement. 2. Multiple additional smaller lesions within LEFT and RIGHT hepatic lobe are increased in size. 3. Potential new lesions on the early arterial phase imaging.   08/16/2018 Imaging   CT Chest 08/16/18   IMPRESSION: Multifocal hepatic masses in this patient with known liver cancer, similar to prior MRI, although poorly evaluated.  No evidence of metastatic disease in the chest.  Aortic Atherosclerosis (ICD10-I70.0) and Emphysema (ICD10-J43.9).   09/13/2018 Procedure   Y90 treatment with Dr. Kathlene Cote on 09/13/18 and 01/09/19   01/29/2019 Imaging    CT AP WO contrast 01/29/19 IMPRESSION: 1. No acute findings within the abdomen or pelvis. 2. Mild decrease in multiple liver masses compared to previous MRI.   02/09/2019 Imaging   MRI Abdomen  IMPRESSION: 1. Today's study demonstrates a mixed response to therapy. Overall, most lesions are decreased in size compared to the prior examination, with the exception  of 2 new lesions in the liver, as detailed  above. Findings remain most compatible with multifocal hepatocellular carcinoma. 2. Hepatic steatosis and hepatic cirrhosis.   02/15/2019 -  Chemotherapy   atezolizumab (Tecentriq) and bevacizumab (Avastin) q3weeks starting with cycle 1 Tecentriq only starting 02/15/19, added Avastin with cycle 2.    03/02/2019 Procedure   Upper Endoscopy by Dr. Tarri Glenn 03/02/19  IMPRESSION - Irregular z-line suspicious for Barrett's esophagus. - Non-bleeding erosive gastropathy and suspected gastritis. Biopsied. - Small hiatal hernia. - Normal examined duodenum. - No varices or portal hypertensive gastropathy present. 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 - THERE IS NO EVIDENCE OF DYSPLASIA OR MALIGNANCY. - SEE COMMENT.   05/17/2019 Imaging   CT AP IMPRESSION: 1. Mixed appearance in the liver, some lesions are significantly smaller other lesions have enlarged. In terms of overall metastatic burden this essentially balances out. 2. Mildly enlarged peripancreatic lymph node, not well seen previously. 3. Small gallstones. 4.  Prominent stool throughout the colon favors constipation. 5. Cirrhosis likely with geographic fibrosis.     06/09/2019 Pathology Results   COLONOSCOPY - FINAL MICROSCOPIC DIAGNOSIS:   A. COLON, RANDOM RIGHT, BIOPSY:  - Benign colonic mucosa.  - No active inflammation or evidence of microscopic colitis.  - No dysplasia or malignancy.   B. COLON, RANDOM TRANSVERSE, BIOPSY:  - Focal active colitis, see comment.  - No dysplasia or malignancy.   C. COLON, RANDOM LEFT, BIOPSY:  - Benign colonic mucosa.  - No active inflammation or evidence of microscopic colitis.  - No dysplasia or malignancy.   D. RECTUM, RANDOM, BIOPSY:  - Chronic mildly active colitis, see comment.  - No dysplasia or malignancy.   COMMENT:   B. and D. Part B reveals only focal active inflammation with no evidence  of  chronicity. Part D has more significant active inflammation with only  a focal area having a dense lymphocytic infiltrate. Given the patient's  history the main differential consideration is medication/chemotherapy  effect. Clinical correlation is recommended.      CURRENT THERAPY: atezolizumab(Tecentriq)and bevacizumab (Avastin) q3weeks starting with cycle 1 Tecentriq onlyon 02/15/19.AddedAvastin with cycle 2.  INTERVAL HISTORY: Mr. Usery returns for f/u as scheduled. He was last seen by Dr. Burr Medico on 10/29. Due to GI bleeding, treatment was held and he was referred back to Dr. Tarri Glenn. Colonoscopy on 11/6 showed a continuous area of bleeding ulcerated mucosa in the rectum. Biopsy of the rectum showed chronic mildly active colitis. There was evidence of active colitis in the transverse colon. Random biopsies of the right and left colon showed benign mucosa.    REVIEW OF SYSTEMS:   Constitutional: Denies fevers, chills or abnormal weight loss Eyes: Denies blurriness of vision Ears, nose, mouth, throat, and face: Denies mucositis or sore throat Respiratory: Denies cough, dyspnea or wheezes Cardiovascular: Denies palpitation, chest discomfort or lower extremity swelling Gastrointestinal:  Denies nausea, heartburn or change in bowel habits Skin: Denies abnormal skin rashes Lymphatics: Denies new lymphadenopathy or easy bruising Neurological:Denies numbness, tingling or new weaknesses Behavioral/Psych: Mood is stable, no new changes  All other systems were reviewed with the patient and are negative.  MEDICAL HISTORY:  Past Medical History:  Diagnosis Date  . Chest pain    pt states when he turned over on other side aided in relief  . Diabetes mellitus   . Dyspnea    increased activity  . Elevated cholesterol   . Hepatitis C   .  Hypertension   . Liver cancer ([place]) 2019    SURGICAL HISTORY: Past Surgical History:  Procedure Laterality Date  . BIOPSY  06/09/2019   Procedure:  BIOPSY;  Surgeon: Thornton Park, MD;  Location: [facility];  Service: Gastroenterology;;  . COLONOSCOPY WITH PROPOFOL N/A 06/09/2019   Procedure: COLONOSCOPY WITH PROPOFOL;  Surgeon: Thornton Park, MD;  Location: [facility];  Service: Gastroenterology;  Laterality: N/A;  . I&D EXTREMITY  06/28/2011   Procedure: IRRIGATION AND DEBRIDEMENT EXTREMITY;  Surgeon: Linna Hoff;  Location: [facility];  Service: Orthopedics;  Laterality: Left;  with application of wound vac  . INCISION AND DRAINAGE OF WOUND  07/02/2011   Procedure: IRRIGATION AND DEBRIDEMENT WOUND;  Surgeon: Linna Hoff;  Location: [facility];  Service: Orthopedics;  Laterality: Left;  Irrigation and Debridement of left arm wound with  Skin Grafting from left thigh   . IR ANGIOGRAM SELECTIVE EACH ADDITIONAL VESSEL  03/18/2018  . IR ANGIOGRAM SELECTIVE EACH ADDITIONAL VESSEL  08/26/2018  . IR ANGIOGRAM SELECTIVE EACH ADDITIONAL VESSEL  08/26/2018  . IR ANGIOGRAM SELECTIVE EACH ADDITIONAL VESSEL  08/26/2018  . IR ANGIOGRAM SELECTIVE EACH ADDITIONAL VESSEL  08/26/2018  . IR ANGIOGRAM SELECTIVE EACH ADDITIONAL VESSEL  09/13/2018  . IR ANGIOGRAM SELECTIVE EACH ADDITIONAL VESSEL  01/09/2019  . IR ANGIOGRAM VISCERAL SELECTIVE  03/18/2018  . IR ANGIOGRAM VISCERAL SELECTIVE  03/18/2018  . IR ANGIOGRAM VISCERAL SELECTIVE  08/26/2018  . IR ANGIOGRAM VISCERAL SELECTIVE  08/26/2018  . IR ANGIOGRAM VISCERAL SELECTIVE  09/13/2018  . IR ANGIOGRAM VISCERAL SELECTIVE  01/09/2019  . IR EMBO ARTERIAL NOT HEMORR HEMANG INC GUIDE ROADMAPPING  08/26/2018  . IR EMBO TUMOR ORGAN ISCHEMIA INFARCT INC GUIDE ROADMAPPING  03/18/2018  . IR EMBO TUMOR ORGAN ISCHEMIA INFARCT INC GUIDE ROADMAPPING  09/13/2018  . IR EMBO TUMOR ORGAN ISCHEMIA INFARCT INC GUIDE ROADMAPPING  01/09/2019  . IR RADIOLOGIST EVAL & MGMT  02/16/2018  . IR RADIOLOGIST EVAL & MGMT  07/12/2018  . IR US GUIDE VASC ACCESS RIGHT  03/18/2018  . IR US GUIDE VASC ACCESS RIGHT  08/26/2018  . IR US GUIDE VASC ACCESS  RIGHT  09/13/2018  . IR US GUIDE VASC ACCESS RIGHT  01/09/2019  . JOINT REPLACEMENT    . PARTIAL HIP ARTHROPLASTY      I have reviewed the social history and family history with the patient and they are unchanged from previous note.  ALLERGIES:  is allergic to gadolinium derivatives and multihance [gadobenate].  MEDICATIONS:  Current Outpatient Medications  Medication Sig Dispense Refill  . gabapentin (NEURONTIN) 300 MG capsule Take 1 capsule (300 mg total) by mouth 3 (three) times daily. 270 capsule 1  . glipiZIDE (GLUCOTROL) 10 MG tablet TAKE 1 TABLET(10 MG) BY MOUTH TWICE DAILY BEFORE A MEAL 180 tablet 2  . Glucerna (GLUCERNA) LIQD Take 1 Can by mouth 3 (three) times daily between meals. 21330 mL 3  . glucose blood test strip Check blood sugar 3 times per day prior to meals. Use as instructed 100 each 12  . hydrochlorothiazide (HYDRODIURIL) 25 MG tablet TAKE 1 TABLET(25 MG) BY MOUTH DAILY (Patient taking differently: Take 25 mg by mouth daily. ) 90 tablet 1  . insulin glargine (LANTUS) 100 UNIT/ML injection Inject 0.3 mLs (30 Units total) into the skin at bedtime. 10 mL 11  . Insulin Pen Needle (PEN NEEDLES) 32G X 4 MM MISC 1 packet by Does not apply route 3 (three) times daily as needed. 100 each 2  .  lisinopril (PRINIVIL,ZESTRIL) 20 MG tablet Take 1 tablet (20 mg total) by mouth daily. 90 tablet 1  . mesalamine (ROWASA) 4 g enema Place 60 mLs (4 g total) rectally 2 (two) times daily. 30 enema 3  . metFORMIN (GLUCOPHAGE) 1000 MG tablet TAKE 1 TABLET(1000 MG) BY MOUTH TWICE DAILY 180 tablet 0  . ondansetron (ZOFRAN) 4 MG tablet TAKE 1 TABLET(4 MG) BY MOUTH EVERY 8 HOURS AS NEEDED FOR NAUSEA OR VOMITING 30 tablet 0  . oxyCODONE (OXY IR/ROXICODONE) 5 MG immediate release tablet Take 1 tablet (5 mg total) by mouth every 6 (six) hours as needed for severe pain. 90 tablet 0  . pantoprazole (PROTONIX) 40 MG tablet Pantoprazole 40 mg every morning for 8 weeks. 90 tablet 0  . pravastatin  (PRAVACHOL) 80 MG tablet Take 1 tablet (80 mg total) by mouth daily. 90 tablet 1  . predniSONE (DELTASONE) 50 MG tablet Take Prednisone 50 mg  13 hr,  7 hr,  And  1 hr before MRI scan  As directed. 3 tablet 0  . prochlorperazine (COMPAZINE) 10 MG tablet Take 1 tablet (10 mg total) by mouth every 6 (six) hours as needed for nausea or vomiting. 30 tablet 1  . Sofosbuvir-Velpatasvir (EPCLUSA) 400-100 MG TABS Take 1 tablet by mouth daily. 28 tablet 2  . Syringe, Disposable, (B-D 30CC SYRINGE) 30 ML MISC 1 Syringe by Does not apply route as directed. 50 each 5   No current facility-administered medications for this visit.     PHYSICAL EXAMINATION: ECOG PERFORMANCE STATUS: {CHL ONC ECOG PS:[id number]}  There were no vitals filed for this visit. There were no vitals filed for this visit.  GENERAL:alert, no distress and comfortable SKIN: skin color, texture, turgor are normal, no rashes or significant lesions EYES: normal, Conjunctiva are pink and non-injected, sclera clear OROPHARYNX:no exudate, no erythema and lips, buccal mucosa, and tongue normal  NECK: supple, thyroid normal size, non-tender, without nodularity LYMPH:  no palpable lymphadenopathy in the cervical, axillary or inguinal LUNGS: clear to auscultation and percussion with normal breathing effort HEART: regular rate & rhythm and no murmurs and no lower extremity edema ABDOMEN:abdomen soft, non-tender and normal bowel sounds Musculoskeletal:no cyanosis of digits and no clubbing  NEURO: alert & oriented x 3 with fluent speech, no focal motor/sensory deficits  LABORATORY DATA:  I have reviewed the data as listed CBC Latest Ref Rng & Units 06/06/2019 06/01/2019 05/17/2019  WBC 4.0 - 10.5 K/uL 3.7(L) 4.1 3.5(L)  Hemoglobin 13.0 - 17.0 g/dL 15.1 15.4 15.9  Hematocrit 39.0 - 52.0 % 45.5 45.5 48.8  Platelets 150.0 - 400.0 K/uL 264.0 259 221     CMP Latest Ref Rng & Units 06/01/2019 05/17/2019 04/27/2019  Glucose 70 - 99 mg/dL 171(H)  222(H) 259(H)  BUN 6 - 20 mg/dL 10 17 14   Creatinine 0.61 - 1.24 mg/dL 0.95 1.08 1.15  Sodium 135 - 145 mmol/L 137 142 138  Potassium 3.5 - 5.1 mmol/L 4.2 4.2 4.6  Chloride 98 - 111 mmol/L 102 103 103  CO2 22 - 32 mmol/L 25 23 27   Calcium 8.9 - 10.3 mg/dL 9.7 9.9 9.6  Total Protein 6.5 - 8.1 g/dL 7.3 7.9 7.5  Total Bilirubin 0.3 - 1.2 mg/dL 1.3(H) 1.2 0.9  Alkaline Phos 38 - 126 U/L 151(H) 135(H) 127(H)  AST 15 - 41 U/L 57(H) 52(H) 58(H)  ALT 0 - 44 U/L 37 39 43      RADIOGRAPHIC STUDIES: I have personally reviewed the radiological images as listed  and agreed with the findings in the report. No results found.   ASSESSMENT & PLAN:  No problem-specific Assessment & Plan notes found for this encounter.   No orders of the defined types were placed in this encounter.  All questions were answered. The patient knows to call the clinic with any problems, questions or concerns. No barriers to learning was detected. I spent {CHL ONC TIME VISIT - MDEKI:[id number]} counseling the patient face to face. The total time spent in the appointment was {CHL ONC TIME VISIT - FPKGY:[id number]} and more than 50% was on counseling and review of test results     Alla Feeling, NP 06/13/19

## 2019-06-14 ENCOUNTER — Telehealth: Payer: Self-pay | Admitting: Nurse Practitioner

## 2019-06-14 ENCOUNTER — Inpatient Hospital Stay: Payer: Medicare Other

## 2019-06-14 ENCOUNTER — Inpatient Hospital Stay: Payer: Medicare Other | Admitting: Nurse Practitioner

## 2019-06-14 ENCOUNTER — Telehealth: Payer: Self-pay

## 2019-06-14 NOTE — Telephone Encounter (Signed)
Patient's wife calls to cancel his appointments today due to him having N/V/D, he does not want to come in for IVF.  He has an appointment with Dr. Tarri Glenn at [facility] on Friday.  She would like these appointments rescheduled.  I have sent a schedule message.

## 2019-06-14 NOTE — Telephone Encounter (Signed)
R/s appt per 11/11 sch message - pt aware of new appt date and time

## 2019-06-14 NOTE — Telephone Encounter (Signed)
Malachy Mood, can you please call him back and encourage him to come in for f/u and IVF. We feel his GI toxicity is related to immunotherapy, the treatment is steroids. We will also discuss changing his therapy. His spouse can come to the visit. If he can not come, will still start oral steroids.  Thanks, Regan Rakers

## 2019-06-14 NOTE — Telephone Encounter (Signed)
I called [name] to discuss his GI symptoms and management. No answer. Left message to call me back to discuss.  Cira Rue, NP

## 2019-06-14 NOTE — Telephone Encounter (Signed)
Spoke with patient's wife explained could be coming from immunotherapy.  Per Cira Rue NP she thinks he should come in and get IVF.  The patient does not want to go anywhere.  The wife says she is pushing fluids.  Explained will probably need steroids.  Made Cira Rue NP aware.

## 2019-06-16 ENCOUNTER — Other Ambulatory Visit: Payer: Self-pay | Admitting: Hematology

## 2019-06-16 ENCOUNTER — Telehealth: Payer: Self-pay | Admitting: Hematology

## 2019-06-16 MED ORDER — PREDNISONE 10 MG PO TABS: 60.0000 mg | ORAL_TABLET | Freq: Every day | ORAL | 0 refills | Status: DC

## 2019-06-16 NOTE — Telephone Encounter (Signed)
Pt did not come in for his appointment yesterday and has not returned our phone calls.  I called him today and spoke with him and his wife.  He still has moderate diarrhea, especially after eating, and abdominal pain.  He is able to eat and drink some, may not be enough.  No fever or other new complaints.  Stool panel was negative. I explained to him that his colitis is likely related to immunotherapy Tecentriq, which we have stopped, and I recommend him to take 3 weeks course of tapering steroids.  I called in prednisone, he will take 60 mg daily for 5 days, then drop to 69m, 285mand 1086maily every 5 days then stop.  He voiced good understanding and agrees to proceed.  He is already on Protonix.  I also encouraged him to use Imodium as needed for diarrhea.  He has appointment with us [place] Wednesday, I will add on IV fluids appointment.  He has a follow-up with Dr. BeaTarri Glennxt Monday.  YanTruitt Merle1/13/2020 5:28 PM

## 2019-06-19 ENCOUNTER — Ambulatory Visit: Payer: Medicare Other | Admitting: Gastroenterology

## 2019-06-20 NOTE — Progress Notes (Addendum)
[place]   [phone] [phone]   Clinic Follow up Note   Patient Care Team: Rocco Serene, MD as PCP - General (Internal Medicine) Comer, Okey Regal, MD as Consulting Physician (Infectious Diseases) Truitt Merle, MD as Consulting Physician (Hematology) 06/21/2019  CHIEF COMPLAINT: F/u HCC, diarrhea, and rectal bleeding   SUMMARY OF ONCOLOGIC HISTORY: Oncology History  Hepatocellular carcinoma ([place])  12/07/2017 Initial Diagnosis   Hepatocellular carcinoma ([place])   12/07/2017 Imaging   US Abdomen 12/07/17 IMPRESSION: ULTRASOUND ABDOMEN: Probable 4 mm gallstone in gallbladder.  Multiple hepatic masses largest heterogeneous RIGHT lobe measuring 6.1 x 5.2 x 5.5 cm; metastatic disease and primary tumor not excluded, recommend further evaluation by MR imaging.  ULTRASOUND HEPATIC ELASTOGRAPHY:  Median hepatic shear wave velocity is calculated at 1.52 m/sec.  Corresponding Metavir fibrosis score is F2 + some F3.  Risk of fibrosis is moderate.  Follow-up: Additional testing appropriate   12/22/2017 PET scan   PET 12/22/17  IMPRESSION: 1. Lesions within the liver have low metabolic activity for size. 2. Dominant lesion in liver does have a focus of metabolic activity which corresponds to a low-density rounded region within the larger lesion. Target this 2 cm focus within the dominant lesion for biopsy. The liver lesions remain concerning for metastatic disease or primary liver carcinoma. Mucinous or cystic carcinomas can have low metabolic activity. 3. Focus of metabolic activity at the level of the hepatic flexure of the colon with some soft tissue thickening not clearly localized. Some additional foci of uptake within colon. This could represent physiologic activity however depending on the biopsy results of the liver recommend colonoscopy.   12/23/2017 Cancer Staging   Staging form: Liver, AJCC 8th Edition - Clinical stage from  12/23/2017: Stage IIIA (cT3, cN0, cM0) - Signed by Truitt Merle, MD on 12/30/2017   12/23/2017 Initial Biopsy   Diagnosis 12/23/17 Liver, needle/core biopsy, Right lobe - HEPATOCELLULAR CARCINOMA. Microscopic Comment Dr. Lyndon Code has reviewed the case. Dr. Burr Medico was paged on 12/24/2017.     02/18/2018 Imaging   CT AP W WO Contrast 02/18/18  IMPRESSION: 1. Multifocal hepatocellular carcinoma, mildly progressive from baseline MRI 12/10/2017. 2. No evidence of extrahepatic nodal metastases or vascular involvement. 3. Borderline hepatic steatosis. No morphologic changes of underlying cirrhosis or portal hypertension. 4. Cholelithiasis. 5. Mild Aortic Atherosclerosis (ICD10-I70.0).   03/24/2018 Procedure   TACE Chemo ebolization on 03/24/18 with Dr. Kathlene Cote    06/14/2018 Imaging   MRI abdomen 06/14/18  IMPRESSION: 1. Dominant lesion in the central LEFT hepatic lobe (segment 4A) is stable in size, decreased in central enhancement, and with persistent nodular wall enhancement. 2. Multiple additional smaller lesions within LEFT and RIGHT hepatic lobe are increased in size. 3. Potential new lesions on the early arterial phase imaging.   08/16/2018 Imaging   CT Chest 08/16/18   IMPRESSION: Multifocal hepatic masses in this patient with known liver cancer, similar to prior MRI, although poorly evaluated.  No evidence of metastatic disease in the chest.  Aortic Atherosclerosis (ICD10-I70.0) and Emphysema (ICD10-J43.9).   09/13/2018 Procedure   Y90 treatment with Dr. Kathlene Cote on 09/13/18 and 01/09/19   01/29/2019 Imaging    CT AP WO contrast 01/29/19 IMPRESSION: 1. No acute findings within the abdomen or pelvis. 2. Mild decrease in multiple liver masses compared to previous MRI.   02/09/2019 Imaging   MRI Abdomen  IMPRESSION: 1. Today's study demonstrates a mixed response to therapy. Overall, most lesions are decreased in size compared to the  prior examination, with the exception of 2 new  lesions in the liver, as detailed above. Findings remain most compatible with multifocal hepatocellular carcinoma. 2. Hepatic steatosis and hepatic cirrhosis.   02/15/2019 -  Chemotherapy   atezolizumab (Tecentriq) and bevacizumab (Avastin) q3weeks starting with cycle 1 Tecentriq only starting 02/15/19, added Avastin with cycle 2.    03/02/2019 Procedure   Upper Endoscopy by Dr. Tarri Glenn 03/02/19  IMPRESSION - Irregular z-line suspicious for Barrett's esophagus. - Non-bleeding erosive gastropathy and suspected gastritis. Biopsied. - Small hiatal hernia. - Normal examined duodenum. - No varices or portal hypertensive gastropathy present. 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 - THERE IS NO EVIDENCE OF DYSPLASIA OR MALIGNANCY. - SEE COMMENT.   05/17/2019 Imaging   CT AP IMPRESSION: 1. Mixed appearance in the liver, some lesions are significantly smaller other lesions have enlarged. In terms of overall metastatic burden this essentially balances out. 2. Mildly enlarged peripancreatic lymph node, not well seen previously. 3. Small gallstones. 4.  Prominent stool throughout the colon favors constipation. 5. Cirrhosis likely with geographic fibrosis.     06/09/2019 Pathology Results   COLONOSCOPY - FINAL MICROSCOPIC DIAGNOSIS:   A. COLON, RANDOM RIGHT, BIOPSY:  - Benign colonic mucosa.  - No active inflammation or evidence of microscopic colitis.  - No dysplasia or malignancy.   B. COLON, RANDOM TRANSVERSE, BIOPSY:  - Focal active colitis, see comment.  - No dysplasia or malignancy.   C. COLON, RANDOM LEFT, BIOPSY:  - Benign colonic mucosa.  - No active inflammation or evidence of microscopic colitis.  - No dysplasia or malignancy.   D. RECTUM, RANDOM, BIOPSY:  - Chronic mildly active colitis, see comment.  - No dysplasia or malignancy.   COMMENT:   B. and D. Part B reveals only focal active  inflammation with no evidence  of chronicity. Part D has more significant active inflammation with only  a focal area having a dense lymphocytic infiltrate. Given the patient's  history the main differential consideration is medication/chemotherapy  effect. Clinical correlation is recommended.      CURRENT THERAPY:  atezolizumab(Tecentriq)and bevacizumab (Avastin) q3weeks starting with cycle 1 Tecentriq onlyon 02/15/19.AddedAvastin with cycle 2.  INTERVAL HISTORY: [name] returns for f/u as scheduled. He completed cycle 4 tecentriq and avastin on 04/27/19. She developed diarrhea and rectal bleeding and underwent colonoscopy per Dr. Tarri Glenn on 06/09/19 which showed colitis likely immune-related. He started on prednisone taper on 06/19/19. He has 3-4 loose BM every few days, has been taking imodium daily and mesalamine enemas which he thinks is starting to constipate him. Rectal bleeding has tapered off over last few days, now only intermittent. He is starting to eat more. Has mild intermittent nausea with sporadic vomiting, last episode 2 days ago. This is not new for him. He rates abdominal pain 8/10, he takes oxycodone 1 tab q6h   MEDICAL HISTORY:  Past Medical History:  Diagnosis Date   Chest pain    pt states when he turned over on other side aided in relief   Diabetes mellitus    Dyspnea    increased activity   Elevated cholesterol    Hepatitis C    Hypertension    Liver cancer ([place]) 2019    SURGICAL HISTORY: Past Surgical History:  Procedure Laterality Date   BIOPSY  06/09/2019   Procedure: BIOPSY;  Surgeon: Thornton Park, MD;  Location: [facility];  Service: Gastroenterology;;   COLONOSCOPY WITH PROPOFOL N/A  06/09/2019   Procedure: COLONOSCOPY WITH PROPOFOL;  Surgeon: Thornton Park, MD;  Location: [facility];  Service: Gastroenterology;  Laterality: N/A;   I&D EXTREMITY  06/28/2011   Procedure: IRRIGATION AND DEBRIDEMENT EXTREMITY;  Surgeon: Linna Hoff;  Location: [facility];  Service: Orthopedics;  Laterality: Left;  with application of wound vac   INCISION AND DRAINAGE OF WOUND  07/02/2011   Procedure: IRRIGATION AND DEBRIDEMENT WOUND;  Surgeon: Linna Hoff;  Location: [facility];  Service: Orthopedics;  Laterality: Left;  Irrigation and Debridement of left arm wound with  Skin Grafting from left thigh    IR ANGIOGRAM SELECTIVE EACH ADDITIONAL VESSEL  03/18/2018   IR ANGIOGRAM SELECTIVE EACH ADDITIONAL VESSEL  08/26/2018   IR ANGIOGRAM SELECTIVE EACH ADDITIONAL VESSEL  08/26/2018   IR ANGIOGRAM SELECTIVE EACH ADDITIONAL VESSEL  08/26/2018   IR ANGIOGRAM SELECTIVE EACH ADDITIONAL VESSEL  08/26/2018   IR ANGIOGRAM SELECTIVE EACH ADDITIONAL VESSEL  09/13/2018   IR ANGIOGRAM SELECTIVE EACH ADDITIONAL VESSEL  01/09/2019   IR ANGIOGRAM VISCERAL SELECTIVE  03/18/2018   IR ANGIOGRAM VISCERAL SELECTIVE  03/18/2018   IR ANGIOGRAM VISCERAL SELECTIVE  08/26/2018   IR ANGIOGRAM VISCERAL SELECTIVE  08/26/2018   IR ANGIOGRAM VISCERAL SELECTIVE  09/13/2018   IR ANGIOGRAM VISCERAL SELECTIVE  01/09/2019   IR EMBO ARTERIAL NOT HEMORR HEMANG INC GUIDE ROADMAPPING  08/26/2018   IR EMBO TUMOR ORGAN ISCHEMIA INFARCT INC GUIDE ROADMAPPING  03/18/2018   IR EMBO TUMOR ORGAN ISCHEMIA INFARCT INC GUIDE ROADMAPPING  09/13/2018   IR EMBO TUMOR ORGAN ISCHEMIA INFARCT INC GUIDE ROADMAPPING  01/09/2019   IR RADIOLOGIST EVAL & MGMT  02/16/2018   IR RADIOLOGIST EVAL & MGMT  07/12/2018   IR US GUIDE VASC ACCESS RIGHT  03/18/2018   IR US GUIDE VASC ACCESS RIGHT  08/26/2018   IR US GUIDE VASC ACCESS RIGHT  09/13/2018   IR US GUIDE VASC ACCESS RIGHT  01/09/2019   JOINT REPLACEMENT     PARTIAL HIP ARTHROPLASTY      I have reviewed the social history and family history with the patient and they are unchanged from previous note.  ALLERGIES:  is allergic to gadolinium derivatives and multihance [gadobenate].  MEDICATIONS:  Current Outpatient Medications  Medication  Sig Dispense Refill   gabapentin (NEURONTIN) 300 MG capsule Take 1 capsule (300 mg total) by mouth 3 (three) times daily. 270 capsule 1   glipiZIDE (GLUCOTROL) 10 MG tablet TAKE 1 TABLET(10 MG) BY MOUTH TWICE DAILY BEFORE A MEAL 180 tablet 2   Glucerna (GLUCERNA) LIQD Take 1 Can by mouth 3 (three) times daily between meals. 21330 mL 3   glucose blood test strip Check blood sugar 3 times per day prior to meals. Use as instructed 100 each 12   hydrochlorothiazide (HYDRODIURIL) 25 MG tablet TAKE 1 TABLET(25 MG) BY MOUTH DAILY (Patient taking differently: Take 25 mg by mouth daily. ) 90 tablet 1   insulin glargine (LANTUS) 100 UNIT/ML injection Inject 0.3 mLs (30 Units total) into the skin at bedtime. 10 mL 11   Insulin Pen Needle (PEN NEEDLES) 32G X 4 MM MISC 1 packet by Does not apply route 3 (three) times daily as needed. 100 each 2   lisinopril (PRINIVIL,ZESTRIL) 20 MG tablet Take 1 tablet (20 mg total) by mouth daily. 90 tablet 1   mesalamine (ROWASA) 4 g enema Place 60 mLs (4 g total) rectally 2 (two) times daily. 30 enema 3   metFORMIN (GLUCOPHAGE) 1000 MG tablet TAKE 1  TABLET(1000 MG) BY MOUTH TWICE DAILY 180 tablet 0   ondansetron (ZOFRAN) 4 MG tablet TAKE 1 TABLET(4 MG) BY MOUTH EVERY 8 HOURS AS NEEDED FOR NAUSEA OR VOMITING 30 tablet 0   oxyCODONE (OXY IR/ROXICODONE) 5 MG immediate release tablet Take 1 tablet (5 mg total) by mouth every 6 (six) hours as needed for severe pain. 120 tablet 0   pantoprazole (PROTONIX) 40 MG tablet Pantoprazole 40 mg every morning for 8 weeks. 90 tablet 0   pravastatin (PRAVACHOL) 80 MG tablet Take 1 tablet (80 mg total) by mouth daily. 90 tablet 1   predniSONE (DELTASONE) 10 MG tablet Take 6 tablets (60 mg total) by mouth daily with breakfast. Take 6 tabs daily for 5 days then 4 tabs daily for 5 days, then 2 tabs daily for 5 days then 1 tab daily for 5 days then stop 65 tablet 0   predniSONE (DELTASONE) 50 MG tablet Take Prednisone 50 mg  13 hr,   7 hr,  And  1 hr before MRI scan  As directed. 3 tablet 0   prochlorperazine (COMPAZINE) 10 MG tablet Take 1 tablet (10 mg total) by mouth every 6 (six) hours as needed for nausea or vomiting. 30 tablet 1   Sofosbuvir-Velpatasvir (EPCLUSA) 400-100 MG TABS Take 1 tablet by mouth daily. 28 tablet 2   Syringe, Disposable, (B-D 30CC SYRINGE) 30 ML MISC 1 Syringe by Does not apply route as directed. 50 each 5   No current facility-administered medications for this visit.     PHYSICAL EXAMINATION: ECOG PERFORMANCE STATUS: 1 - Symptomatic but completely ambulatory  Vitals:   06/21/19 1521 06/21/19 1528  BP: (!) 152/94 (!) 140/94  Pulse: (!) 101   Resp: 18   Temp: 97.8 F (36.6 C)   SpO2: 96%    Filed Weights   06/21/19 1521  Weight: 144 lb 12.8 oz (65.7 kg)    GENERAL:alert, no distress and comfortable SKIN: no rash  EYES: sclera clear LYMPH:  no palpable cervical lymphadenopathy LUNGS: clear with normal breathing effort HEART: regular rate & rhythm, no lower extremity edema ABDOMEN:abdomen soft with normal bowel sounds. Mild RUQ and LLQ tenderness  NEURO: alert & oriented x 3 with fluent speech, normal gait    LABORATORY DATA:  I have reviewed the data as listed CBC Latest Ref Rng & Units 06/21/2019 06/06/2019 06/01/2019  WBC 4.0 - 10.5 K/uL 5.4 3.7(L) 4.1  Hemoglobin 13.0 - 17.0 g/dL 15.3 15.1 15.4  Hematocrit 39.0 - 52.0 % 46.1 45.5 45.5  Platelets 150 - 400 K/uL 295 264.0 259     CMP Latest Ref Rng & Units 06/21/2019 06/01/2019 05/17/2019  Glucose 70 - 99 mg/dL 217(H) 171(H) 222(H)  BUN 6 - 20 mg/dL 16 10 17   Creatinine 0.61 - 1.24 mg/dL 0.97 0.95 1.08  Sodium 135 - 145 mmol/L 139 137 142  Potassium 3.5 - 5.1 mmol/L 4.5 4.2 4.2  Chloride 98 - 111 mmol/L 100 102 103  CO2 22 - 32 mmol/L 24 25 23   Calcium 8.9 - 10.3 mg/dL 9.2 9.7 9.9  Total Protein 6.5 - 8.1 g/dL 7.0 7.3 7.9  Total Bilirubin 0.3 - 1.2 mg/dL 1.3(H) 1.3(H) 1.2  Alkaline Phos 38 - 126 U/L 221(H)  151(H) 135(H)  AST 15 - 41 U/L 82(H) 57(H) 52(H)  ALT 0 - 44 U/L 59(H) 37 39      RADIOGRAPHIC STUDIES: I have personally reviewed the radiological images as listed and agreed with the findings in the report. No  results found.   ASSESSMENT & PLAN: [name] is a 53 y.o. male with   1.Hepatocellular carcinoma,stage IIIA (cT3N0M0) -He was diagnosed in 12/2017. He is s/p TACE Chemo embolization on 03/24/18 with Dr. Kathlene Cote.  -His repeat MRI in 06/14/18 showed disease progression in liver, except the treated lesion in 4A.No extrahepatic disease on the recent abdominal MRI. We discussed that his liver cancer is not curable at this stage, andgoal of therapy is to control disease andprolong his life. -He underwent Y90 treatmentson2/11/20 and 01/09/19.  -After latest Y90, he has worsening RUQ abdominal pain. He has hepatomegaly with significant tenderness on prior exam.  -Recent CT AP and MRI from June and July showed his previous treated liver lesions have much improved but now has 2 new liver lesions. Overall disease progression.  -He started atezolizumab (Tecentriq) and bevacizumab (Avastin) q3weeks. Started cycle 1 with Tecentriq only on 7/15/20andaddAvastinwith cycle 2.  -S/p 4 cycles. He tolerated well overall, abdominal pain mildly improved -He has been tolerating chemotherapy overall well, his abdominal pain overall mildly improved -Restaging CT on 05/17/19 showed showed mixed response, overall stable disease.  No new lesions.  -He developed diarrhea and rectal bleeding. Treatment was held and he underwent diagnostic colonoscopy per Dr. Tarri Glenn which shows colitis in the transverse colon and rectum. The colitis was felt to be immune-related, likely due to tecentriq. Will stop this treatment  -He started mesalamine enema per GI, and began prednisone taper on 11/16 starting at 60 mg. He will taper by 20 mg q5 days as long as he improves. Will taper over 3 weeks  -Mr. Thorley appears  stable today. Diarrhea and rectal bleeding are starting to improve. He can stop imodium as he feels he's starting to get constipated.  -he appears to be hydrated, will hold IVF today. Labs stable  -he will return in 3 weeks to monitor his recovery  2. Abdominal pain, nausea and constipation, new diarrhea and hematochezia -Abdominal pain related to his liver cancer, he is currently on oxycodone every 8 hours as needed -He has chronic constipation but developed diarrhea and rectal bleeding in 05/2019 -colonoscopy showed colitis which is felt to be immune-related, secondary to tecentriq -he started prednisone taper; tecentriq has been stopped -he has 8/10 abdominal pain today. Oxycodone recently increased to 1 tab q6, he takes it as prescribed.  -he is asking for more tabs, he agrees to take as prescribed. He is drinking less, feels less depression.  -will   3. Hepatitis C, untreated, H. Pylorie -He was diagnosed with Hepatitis Cin 09/2017, however he is unsure of how long he has had hepatitis C prior to his diagnosis.  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 -Dr. Becky Augusta not recommendtreatment until Cancer is controlled. -I encouraged the patient to maintain follow up with Dr. Linus Salmons. -No significant liver cirrhosis on MRI. 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  -His GI started him onH.Pylorie treatmentin 03/2019, and repeated H. pylori was negative  4.DM, HTN -Patient is insulin dependent diabetic and on Lantus -Patient latest A1c on12/16/19at 8.4 -He is on lisinopril  -f/u with PCP -BP elevated today, possibly related to pain  5. Alcohol and smoking cessation -It has been recommended to discontinue use of ETOH due to the patient already with multiple liver issues.  -He does smoke cigarettes and he has a 40 year history. -He has reduced smoking toa  fewcigarettes  a day -He reduced drinking to beer occasionally. We again recommended complete cessation. He understands. He reports he is drinking less   6. Goal of care discussion  -The patient understands the goal of care is palliative. -he is full code  PLAN: -Labs reviewed -Continue prednisone taper, reduce to 40 mg on 11/21 if diarrhea and rectal bleeding continue to improve; taper by 20 mg q5 days  -Lab, f/u in 3 weeks  -No need for IVF today -Refilled oxycodone #120 tabs   All questions were answered. The patient knows to call the clinic with any problems, questions or concerns. No barriers to learning was detected.     Alla Feeling, NP 06/21/19   Addendum  I have seen the patient, examined him. I agree with the assessment and and plan and have edited the notes.   [name] has started prednisone I prescribed last Friday, overall feels better, still has moderate diarrhea, no fever or other new complaints.  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 Will hold his [facility] treatment for now. We discussed other treatment options, such as sorafenib or lenvatinib, he is open to that. Will finalize on next visit when he recovers better. He declined IVF today.   We discussed pain management and reviewed narcotics dependence again, he agrees to take no more than oxycodone 60m, 4 tabs a day. He understand he will not get early refill on his narcotics. I spoke with his wife today also.   YTruitt Merle 06/21/2019

## 2019-06-21 ENCOUNTER — Encounter: Payer: Self-pay | Admitting: Nurse Practitioner

## 2019-06-21 ENCOUNTER — Other Ambulatory Visit: Payer: Self-pay

## 2019-06-21 ENCOUNTER — Inpatient Hospital Stay (HOSPITAL_BASED_OUTPATIENT_CLINIC_OR_DEPARTMENT_OTHER): Payer: Medicare Other | Admitting: Nurse Practitioner

## 2019-06-21 ENCOUNTER — Inpatient Hospital Stay: Payer: Medicare Other | Attending: Hematology

## 2019-06-21 VITALS — BP 140/94 | HR 101 | Temp 97.8°F | Resp 18 | Ht 66.0 in | Wt 144.8 lb

## 2019-06-21 DIAGNOSIS — K5900 Constipation, unspecified: Secondary | ICD-10-CM | POA: Insufficient documentation

## 2019-06-21 DIAGNOSIS — K760 Fatty (change of) liver, not elsewhere classified: Secondary | ICD-10-CM | POA: Diagnosis not present

## 2019-06-21 DIAGNOSIS — F17210 Nicotine dependence, cigarettes, uncomplicated: Secondary | ICD-10-CM | POA: Insufficient documentation

## 2019-06-21 DIAGNOSIS — Z9221 Personal history of antineoplastic chemotherapy: Secondary | ICD-10-CM | POA: Insufficient documentation

## 2019-06-21 DIAGNOSIS — Z7952 Long term (current) use of systemic steroids: Secondary | ICD-10-CM | POA: Insufficient documentation

## 2019-06-21 DIAGNOSIS — I10 Essential (primary) hypertension: Secondary | ICD-10-CM | POA: Diagnosis not present

## 2019-06-21 DIAGNOSIS — Z79899 Other long term (current) drug therapy: Secondary | ICD-10-CM | POA: Insufficient documentation

## 2019-06-21 DIAGNOSIS — I700 Atherosclerosis of aorta: Secondary | ICD-10-CM | POA: Diagnosis not present

## 2019-06-21 DIAGNOSIS — E7800 Pure hypercholesterolemia, unspecified: Secondary | ICD-10-CM | POA: Diagnosis not present

## 2019-06-21 DIAGNOSIS — R109 Unspecified abdominal pain: Secondary | ICD-10-CM | POA: Insufficient documentation

## 2019-06-21 DIAGNOSIS — Z794 Long term (current) use of insulin: Secondary | ICD-10-CM | POA: Diagnosis not present

## 2019-06-21 DIAGNOSIS — C220 Liver cell carcinoma: Secondary | ICD-10-CM | POA: Diagnosis not present

## 2019-06-21 DIAGNOSIS — K7460 Unspecified cirrhosis of liver: Secondary | ICD-10-CM | POA: Insufficient documentation

## 2019-06-21 DIAGNOSIS — K529 Noninfective gastroenteritis and colitis, unspecified: Secondary | ICD-10-CM | POA: Diagnosis not present

## 2019-06-21 DIAGNOSIS — E119 Type 2 diabetes mellitus without complications: Secondary | ICD-10-CM | POA: Insufficient documentation

## 2019-06-21 DIAGNOSIS — B1920 Unspecified viral hepatitis C without hepatic coma: Secondary | ICD-10-CM | POA: Insufficient documentation

## 2019-06-21 LAB — CMP (CANCER CENTER ONLY)
ALT: 59 U/L — ABNORMAL HIGH (ref 0–44)
AST: 82 U/L — ABNORMAL HIGH (ref 15–41)
Albumin: 3 g/dL — ABNORMAL LOW (ref 3.5–5.0)
Alkaline Phosphatase: 221 U/L — ABNORMAL HIGH (ref 38–126)
Anion gap: 15 (ref 5–15)
BUN: 16 mg/dL (ref 6–20)
CO2: 24 mmol/L (ref 22–32)
Calcium: 9.2 mg/dL (ref 8.9–10.3)
Chloride: 100 mmol/L (ref 98–111)
Creatinine: 0.97 mg/dL (ref 0.61–1.24)
GFR, Est AFR Am: >60 mL/min (ref >60)
GFR, Estimated: >60 mL/min (ref >60)
Glucose, Bld: 217 mg/dL — ABNORMAL HIGH (ref 70–99)
Potassium: 4.5 mmol/L (ref 3.5–5.1)
Sodium: 139 mmol/L (ref 135–145)
Total Bilirubin: 1.3 mg/dL — ABNORMAL HIGH (ref 0.3–1.2)
Total Protein: 7 g/dL (ref 6.5–8.1)

## 2019-06-21 LAB — CBC WITH DIFFERENTIAL (CANCER CENTER ONLY)
Abs Immature Granulocytes: 0.04 10*3/uL (ref 0.00–0.07)
Basophils Absolute: 0 10*3/uL (ref 0.0–0.1)
Basophils Relative: 0 %
Eosinophils Absolute: 0 10*3/uL (ref 0.0–0.5)
Eosinophils Relative: 0 %
HCT: 46.1 % (ref 39.0–52.0)
Hemoglobin: 15.3 g/dL (ref 13.0–17.0)
Immature Granulocytes: 1 %
Lymphocytes Relative: 7 %
Lymphs Abs: 0.4 10*3/uL — ABNORMAL LOW (ref 0.7–4.0)
MCH: 30.1 pg (ref 26.0–34.0)
MCHC: 33.2 g/dL (ref 30.0–36.0)
MCV: 90.6 fL (ref 80.0–100.0)
Monocytes Absolute: 0.6 10*3/uL (ref 0.1–1.0)
Monocytes Relative: 11 %
Neutro Abs: 4.4 10*3/uL (ref 1.7–7.7)
Neutrophils Relative %: 81 %
Platelet Count: 295 10*3/uL (ref 150–400)
RBC: 5.09 MIL/uL (ref 4.22–5.81)
RDW: 13.4 % (ref 11.5–15.5)
WBC Count: 5.4 10*3/uL (ref 4.0–10.5)
nRBC: 0 % (ref 0.0–0.2)

## 2019-06-21 MED ORDER — OXYCODONE HCL 5 MG PO TABS: 5.0000 mg | ORAL_TABLET | Freq: Four times a day (QID) | ORAL | 0 refills | Status: DC | PRN

## 2019-06-22 ENCOUNTER — Telehealth: Payer: Self-pay | Admitting: Nurse Practitioner

## 2019-06-22 ENCOUNTER — Other Ambulatory Visit: Payer: Self-pay | Admitting: Hematology

## 2019-06-22 DIAGNOSIS — C220 Liver cell carcinoma: Secondary | ICD-10-CM

## 2019-06-22 LAB — AFP TUMOR MARKER: AFP, Serum, Tumor Marker: 36.1 ng/mL — ABNORMAL HIGH (ref 0.0–8.3)

## 2019-06-22 NOTE — Telephone Encounter (Signed)
Scheduled appt per 11/18 los.  Printed and mailed appt calendar.

## 2019-07-04 ENCOUNTER — Telehealth: Payer: Self-pay | Admitting: Gastroenterology

## 2019-07-04 NOTE — Telephone Encounter (Signed)
Hopefully he will go to the ED tonight given the severity of his symptoms.

## 2019-07-04 NOTE — Telephone Encounter (Signed)
Pt's partner [name] called and stated that his previous symptoms have returned--N/V, loose stools and abd pain.  Please advise.

## 2019-07-04 NOTE — Telephone Encounter (Signed)
This RN called both the patient and his partner's cell with no answer. Left messages on both voice mails.

## 2019-07-04 NOTE — Telephone Encounter (Signed)
FYI  Attempted to speak with the pain but was unsuccessful due to the patient moaning and yelling in pain. The patient was SOB will speaking. The patient's partner states he has been having severe abdominal pain for about 36 hours accompanied with about 3 days of diarrhea. The patient was unable to have a conversation so this RN could appropriately triage the patient. The patient was encouraged to go to the emergency department but the patient refused, all the while screaming out in pain stating "I just need some sleep!" This RN scheduled the patient to see Amy Esterwood tomorrow morning at 8:30 am.

## 2019-07-05 ENCOUNTER — Other Ambulatory Visit (INDEPENDENT_AMBULATORY_CARE_PROVIDER_SITE_OTHER): Payer: Medicare Other

## 2019-07-05 ENCOUNTER — Telehealth: Payer: Self-pay | Admitting: Physician Assistant

## 2019-07-05 ENCOUNTER — Other Ambulatory Visit: Payer: Self-pay

## 2019-07-05 ENCOUNTER — Encounter: Payer: Self-pay | Admitting: Physician Assistant

## 2019-07-05 ENCOUNTER — Ambulatory Visit (INDEPENDENT_AMBULATORY_CARE_PROVIDER_SITE_OTHER)
Admission: RE | Admit: 2019-07-05 | Discharge: 2019-07-05 | Disposition: A | Discharge status: Home / Self Care | Payer: Medicare Other | Source: Ambulatory Visit | Attending: Physician Assistant | Admitting: Physician Assistant

## 2019-07-05 ENCOUNTER — Ambulatory Visit (INDEPENDENT_AMBULATORY_CARE_PROVIDER_SITE_OTHER): Payer: Medicare Other | Admitting: Physician Assistant

## 2019-07-05 VITALS — BP 138/74 | HR 91 | Temp 97.4°F | Ht 67.0 in | Wt 139.0 lb

## 2019-07-05 DIAGNOSIS — K529 Noninfective gastroenteritis and colitis, unspecified: Secondary | ICD-10-CM

## 2019-07-05 DIAGNOSIS — R109 Unspecified abdominal pain: Secondary | ICD-10-CM

## 2019-07-05 DIAGNOSIS — C220 Liver cell carcinoma: Secondary | ICD-10-CM

## 2019-07-05 LAB — COMPREHENSIVE METABOLIC PANEL
ALT: 39 U/L (ref 0–53)
AST: 44 U/L — ABNORMAL HIGH (ref 0–37)
Albumin: 3 g/dL — ABNORMAL LOW (ref 3.5–5.2)
Alkaline Phosphatase: 241 U/L — ABNORMAL HIGH (ref 39–117)
BUN: 17 mg/dL (ref 6–23)
CO2: 25 mEq/L (ref 19–32)
Calcium: 8.9 mg/dL (ref 8.4–10.5)
Chloride: 103 mEq/L (ref 96–112)
Creatinine, Ser: 0.79 mg/dL (ref 0.40–1.50)
GFR: 123.91 mL/min (ref >60.00)
Glucose, Bld: 129 mg/dL — ABNORMAL HIGH (ref 70–99)
Potassium: 3.9 mEq/L (ref 3.5–5.1)
Sodium: 137 mEq/L (ref 135–145)
Total Bilirubin: 3.7 mg/dL — ABNORMAL HIGH (ref 0.2–1.2)
Total Protein: 6.3 g/dL (ref 6.0–8.3)

## 2019-07-05 LAB — CBC WITH DIFFERENTIAL/PLATELET
Basophils Absolute: 0 10*3/uL (ref 0.0–0.1)
Basophils Relative: 0.3 % (ref 0.0–3.0)
Eosinophils Absolute: 0.1 10*3/uL (ref 0.0–0.7)
Eosinophils Relative: 1 % (ref 0.0–5.0)
HCT: 38.9 % — ABNORMAL LOW (ref 39.0–52.0)
Hemoglobin: 13.1 g/dL (ref 13.0–17.0)
Lymphocytes Relative: 6.9 % — ABNORMAL LOW (ref 12.0–46.0)
Lymphs Abs: 0.4 10*3/uL — ABNORMAL LOW (ref 0.7–4.0)
MCHC: 33.5 g/dL (ref 30.0–36.0)
MCV: 89.9 fl (ref 78.0–100.0)
Monocytes Absolute: 1.2 10*3/uL — ABNORMAL HIGH (ref 0.1–1.0)
Monocytes Relative: 18.6 % — ABNORMAL HIGH (ref 3.0–12.0)
Neutro Abs: 4.7 10*3/uL (ref 1.4–7.7)
Neutrophils Relative %: 73.2 % (ref 43.0–77.0)
Platelets: 304 10*3/uL (ref 150.0–400.0)
RBC: 4.33 Mil/uL (ref 4.22–5.81)
RDW: 14 % (ref 11.5–15.5)
WBC: 6.5 10*3/uL (ref 4.0–10.5)

## 2019-07-05 MED ORDER — PREDNISONE 10 MG PO TABS: ORAL_TABLET | ORAL | 0 refills | Status: AC

## 2019-07-05 MED ORDER — MESALAMINE 1000 MG RE SUPP: 1000.0000 mg | Freq: Two times a day (BID) | RECTAL | 0 refills | Status: AC

## 2019-07-05 MED ORDER — DICYCLOMINE HCL 10 MG PO CAPS: 10.0000 mg | ORAL_CAPSULE | Freq: Four times a day (QID) | ORAL | 3 refills | Status: AC | PRN
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
## 2019-07-05 NOTE — Patient Instructions (Addendum)
If you are age 53 or older, your body mass index should be between 23-30. Your Body mass index is 21.77 kg/m. If this is out of the aforementioned range listed, please consider follow up with your Primary Care Provider.  If you are age 65 or younger, your body mass index should be between 19-25. Your Body mass index is 21.77 kg/m. If this is out of the aformentioned range listed, please consider follow up with your Primary Care Provider.   We have sent the following medications to your pharmacy for you to pick up at your convenience: Prednisone Canasa suppositories Bentyl  Your provider has requested that you go to the basement level for lab work AND X-rays before leaving today. Press "B" on the elevator. The lab is located at the first door on the left as you exit the elevator.  Follow up with Dr. Tarri Glenn on 08/02/19 at 8:50 am.  Go to ER if worse.   Thank you for choosing me and [facility].   Amy Esterwood, PA-C

## 2019-07-05 NOTE — Telephone Encounter (Signed)
Pt's partner called and inquired whether pt can start taking medications.

## 2019-07-06 NOTE — Progress Notes (Signed)
Reviewed and agree with management plans. ? ?Jeilani Grupe L. Lindbergh Winkles, MD, MPH  ?

## 2019-07-10 ENCOUNTER — Other Ambulatory Visit: Payer: Self-pay

## 2019-07-10 NOTE — Patient Outreach (Signed)
[facility]) Care Management  07/10/2019  Maleke Feria Pruden 1966/06/20 [id number]   Medication Adherence call to Mr. Manson Sweney [facility] Identifiers Verify spoke with patient he is past due on Pravastatin 80 mg,patient explain he takes 1 tablet daily and has medication at this time,patient express he is not feeling well at this time. Mr. Cregg is showing past due under [place].   [place] Management Direct Dial [phone]  Fax [phone] [email or web address]

## 2019-07-13 ENCOUNTER — Inpatient Hospital Stay: Payer: Medicare Other | Admitting: Hematology

## 2019-07-13 ENCOUNTER — Inpatient Hospital Stay: Payer: Medicare Other

## 2019-07-14 ENCOUNTER — Telehealth: Payer: Self-pay | Admitting: Hematology

## 2019-07-14 ENCOUNTER — Other Ambulatory Visit: Payer: Self-pay | Admitting: Gastroenterology

## 2019-07-14 DIAGNOSIS — K259 Gastric ulcer, unspecified as acute or chronic, without hemorrhage or perforation: Secondary | ICD-10-CM

## 2019-07-14 NOTE — Telephone Encounter (Signed)
R/s appt per 12/11 sch message - pt aware of appt date and time

## 2019-07-19 ENCOUNTER — Telehealth: Payer: Self-pay

## 2019-07-19 ENCOUNTER — Inpatient Hospital Stay: Payer: Medicare Other | Attending: Hematology

## 2019-07-19 ENCOUNTER — Telehealth: Payer: Self-pay | Admitting: Nurse Practitioner

## 2019-07-19 ENCOUNTER — Other Ambulatory Visit: Payer: Self-pay | Admitting: Hematology

## 2019-07-19 ENCOUNTER — Inpatient Hospital Stay: Payer: Medicare Other | Admitting: Nurse Practitioner

## 2019-07-19 DIAGNOSIS — C220 Liver cell carcinoma: Secondary | ICD-10-CM

## 2019-07-19 MED ORDER — OXYCODONE HCL 5 MG PO TABS: 5.0000 mg | ORAL_TABLET | Freq: Four times a day (QID) | ORAL | 0 refills | Status: AC | PRN

## 2019-07-19 MED ORDER — PROCHLORPERAZINE MALEATE 10 MG PO TABS: 10.0000 mg | ORAL_TABLET | Freq: Four times a day (QID) | ORAL | 3 refills | Status: AC | PRN
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
## 2019-07-20 ENCOUNTER — Telehealth: Payer: Self-pay

## 2019-07-20 ENCOUNTER — Telehealth: Payer: Self-pay | Admitting: Hematology

## 2019-07-20 NOTE — Telephone Encounter (Signed)
Scheduled appt per 12/16 shc message- pt is aware of appt date and time

## 2019-07-20 NOTE — Telephone Encounter (Signed)
TC per Cira Rue NP to schedule  his CT CAP next week. I scheduled it for next week Wednesday 07/26/19 @ 1:30p. For his contrast allergy I let him know that he'll need to do the hypersensitivity protocol. He is already on prednisone taper for colitis so I instructed him to please take 5 tabs (50 mg) at 13, 7, and 1 hour prior to CT plus 50 mg benadryl at 1 hour prior. He can skip his regular prednisone dose that day. I made sure that he wrote instructions down on a piece of paper and read everything back to me. Patient read everything back correctly and verbalized understanding.

## 2019-07-20 NOTE — Telephone Encounter (Signed)
Left voicemail for patient to call back [facility] 

## 2019-07-20 NOTE — Telephone Encounter (Signed)
Pt also instructed not to eat anything 4 hours prior to CT.

## 2019-07-26 ENCOUNTER — Encounter (HOSPITAL_COMMUNITY): Payer: Self-pay

## 2019-07-26 ENCOUNTER — Other Ambulatory Visit: Payer: Self-pay

## 2019-07-26 ENCOUNTER — Ambulatory Visit (HOSPITAL_COMMUNITY)
Admission: RE | Admit: 2019-07-26 | Discharge: 2019-07-26 | Disposition: A | Discharge status: Home / Self Care | Payer: Medicare Other | Source: Ambulatory Visit | Attending: Nurse Practitioner | Admitting: Nurse Practitioner

## 2019-07-26 DIAGNOSIS — C220 Liver cell carcinoma: Secondary | ICD-10-CM | POA: Insufficient documentation

## 2019-07-26 MED ORDER — IOHEXOL 300 MG/ML  SOLN
100.0000 mL | Freq: Once | INTRAMUSCULAR | Status: AC | PRN
  Administered 2019-07-26: 14:00:00 100 mL via INTRAVENOUS

## 2019-07-26 MED ORDER — SODIUM CHLORIDE (PF) 0.9 % IJ SOLN
INTRAMUSCULAR | Status: AC
  Filled 2019-07-26: qty 50

## 2019-07-27 ENCOUNTER — Telehealth: Payer: Self-pay

## 2019-07-27 ENCOUNTER — Telehealth: Payer: Self-pay | Admitting: Nurse Practitioner

## 2019-07-27 NOTE — Telephone Encounter (Signed)
Spoke with patient regarding CT scan which shows worsening colitis, he continues to have diarrhea averaging 4 or more stools daily with abdominal pain.  Offered patient to come in and be seen in [facility] today but he declined stating "I don't want to come in".  Informed him per Cira Rue NP will probably have to go up on Prednisone to 50 mg but this will effect his blood sugar and his PCP will have to adjust his diabetes medication. He agrees to do this.

## 2019-07-27 NOTE — Telephone Encounter (Signed)
Scheduled appt per 12/24 sch message - pt and wife aware of appt date and time

## 2019-08-01 ENCOUNTER — Other Ambulatory Visit: Payer: Medicare Other

## 2019-08-01 ENCOUNTER — Ambulatory Visit: Payer: Medicare Other | Admitting: Nurse Practitioner

## 2019-08-01 NOTE — Progress Notes (Deleted)
Referring Provider: Rocco Serene, MD Primary Care Physician:  Rocco Serene, MD  Chief complaint:  ***   IMPRESSION:  Nausea Progressive IIIa hepatocellular carcinoma with associated RUQ abdominal pain Treatment nave genotype 1a chronic hepatitis C with a viral load of 6,550,000 IU/mL Advanced fibrosis noted on Fibrotest 09/23/17: F3 and cirrhosis noted on recent noncontrasted CT scan History of regular alcohol use Daily NSAIDs Normal platelets Normal PT/INR  #74 53 year old male with acute colitis secondary to immunotherapy for hepatocellular carcinoma. Treatment has been on hold since 06/01/2019. Patient responded well to prednisone, but symptoms abruptly flared after completing tapered course of prednisone. Though he is complaining of severe pain he feels this is very similar to what he was experiencing previously.  #2Hepatocellular carcinoma stage IIIa-multiple hepatic lesions on recent CT, felt overall stable, new peripancreatic node #3.  Hep C/genotype Ia with cirrhosis #4 chronic pain syndrome/neuropathy  Plan from Amy; plain abdominal films today, rule out perforation. CBC and c-Met today Restart prednisone 60 mg p.o. daily x2 weeks then decrease to 40 mg p.o. daily x1 week, then decrease by 10 mg every week, then stop Patient did not want to resume Rowasa enemas, will send prescription for Canasa suppositories 1 per rectum twice daily x1 month. Start dicyclomine 10 mg p.o. every 6 hours as needed for abdominal pain/cramping.  He was advised that he could resume use of Imodium on a as needed basis if needed in addition to the dicyclomine.  Hopefully dicyclomine will help with diarrhea and pain. Follow-up with Dr. Tarri Glenn in 2 to 3 weeks.  Patient and wife were instructed to call back if he is not improved in the next 48 hours, and they were instructed to go to the emergency room for any acute worsening of symptoms.  PLAN: ***  Please see the "Patient Instructions"  section for addition details about the plan.  HPI: [name] is a 53 y.o. male recently diagnosed with an acute colitis felt secondary to immunotherapy for hepatocellular carcinoma.  He comes in today for follow-up. Patient has history of hepatitis C/genotype 1a with cirrhosis and hepatocellular carcinoma stage IIIa diagnosed in May 2019.  He underwent TACE chemoembolization in August 2019 and has been on chemotherapy every 3 weeks with atezolizumab and Bevacizumab. He underwent CT of the abdomen and pelvis on 05/17/2019 for restaging which showed a mixed appearance of the liver with multiple hepatic lesions some smaller and some larger but no new lesions.  Also noted to have 1 mildly enlarged peripancreatic node which was new. He was seen here on 06/06/2019 with complaints of generalized abdominal pain and new onset of rectal bleeding. He underwent colonoscopy on 06/09/2019 that showed ulcerated mucosa from the anal verge to 20 cm.  Biopsies showed an acute colitis from rectal biopsies and random transverse colon biopsies.  Right colon biopsy showed no active inflammation.  He was started on prednisone 60 mg/day on 06/19/2019 with plans for tapered course, and  had also been started on Rowasa enemas twice daily. Patient says that he was feeling significantly better while on prednisone which she completed last week.  He did not like using the enemas and did not stay on these very long.  Since coming off of prednisone, he developed acute recurrence of abdominal pain about 4 days ago which he describes as severe and crampy in nature.  He is not having any significant rectal bleeding at this point and continues to have loose stools.  He says he had  about 7 episodes yesterday, and has had one episode today.  He had previously been on Imodium however while he was on the prednisone this made him constipated and he has not restarted it.  He describes pain in the left side of his abdomen, and low in his pelvis into  his rectum.  No fever or chills, no nausea or vomiting.  He had called here yesterday and was complaining of severe pain and was moaning.  He was advised to go to the emergency room which he did not.  He has more nausea and vomiting after meals lately. Zofran is not as effective. He is able to eat and drink. Diarrhea is improving, now intermittent, has 4 episodes per day. Denies blood in stool. Uses imodium PRN. His blood sugar was elevated on 50 mg prednisone so today he started 40 mg. Abdominal pain is up to 10/10 at times, little worse. Currently 8/10. Takes oxyIR 4 times daily which helps. Dr. Burr Medico and I reviewed the plan for him and I discussed with the patient, which includes restaging scan for new baseline and starting oral therapy TKI lenvatinib for disease control once he recovers a bit more. I reviewed side effect profile, he is not ready to consent, wishes to discuss treatment with Dr. Burr Medico after his scan. I will place orders and send schedule message. I recommend to taper prednisone to 30 mg in 1 week. I will prescribe compazine to alternate with zofran PRN and refill oxycodone for pick up on 12/18. He agrees and appreciates the call.   CT scan 07/26/19:  IMPRESSION: 1. More profound changes of colitis perhaps associated now with enteritis in this patient with history of a immune mediated colitis. By report the patient has experienced increasing abdominal pain, nausea and vomiting. Segmental changes of presumed colitis along the left colon are marked. There is also some increasing distension of the colon proximal to this area but without frank signs of obstruction at this time. 2. No evidence of thoracic metastatic disease. 3. Worsening heterogeneous enhancement of the liver with nodular features particularly in the left hepatic lobe. Findings are consistent with worsening of nodular and infiltrative hepatocellular carcinoma. 4. Some of the dominant peripherally calcified lesions  have decreased slightly in size. On balance, the exam shows marked interval worsening. 5. Small volume ascites has increased along the hepatic margin. 6. These results were called by telephone at the time of interpretation on 07/26/2019 at 5:50 pm to provider Dr Maylon Cos, who verbally acknowledged these results.  CT scan which shows worsening colitis, he continues to have diarrhea averaging 4 or more stools daily with abdominal pain.  Offered patient to come in and be seen in [facility] today but he declined stating "I don't want to come in".  Informed him per Cira Rue NP will probably have to go up on Prednisone to 50 mg but this will effect his blood sugar and his PCP will have to adjust his diabetes medication. He agrees to do this.    Past Medical History:  Diagnosis Date  . Chest pain    pt states when he turned over on other side aided in relief  . Diabetes mellitus   . Dyspnea    increased activity  . Elevated cholesterol   . Hepatitis C   . History of colitis 2020  . Hypertension   . Liver cancer ([place]) 2019    Past Surgical History:  Procedure Laterality Date  . BIOPSY  06/09/2019   Procedure: BIOPSY;  Surgeon: Thornton Park, MD;  Location: [facility];  Service: Gastroenterology;;  . COLONOSCOPY WITH PROPOFOL N/A 06/09/2019   Procedure: COLONOSCOPY WITH PROPOFOL;  Surgeon: Thornton Park, MD;  Location: [facility];  Service: Gastroenterology;  Laterality: N/A;  . I & D EXTREMITY  06/28/2011   Procedure: IRRIGATION AND DEBRIDEMENT EXTREMITY;  Surgeon: Linna Hoff;  Location: [facility];  Service: Orthopedics;  Laterality: Left;  with application of wound vac  . INCISION AND DRAINAGE OF WOUND  07/02/2011   Procedure: IRRIGATION AND DEBRIDEMENT WOUND;  Surgeon: Linna Hoff;  Location: [facility];  Service: Orthopedics;  Laterality: Left;  Irrigation and Debridement of left arm wound with  Skin Grafting from left thigh   . IR ANGIOGRAM SELECTIVE EACH ADDITIONAL VESSEL   03/18/2018  . IR ANGIOGRAM SELECTIVE EACH ADDITIONAL VESSEL  08/26/2018  . IR ANGIOGRAM SELECTIVE EACH ADDITIONAL VESSEL  08/26/2018  . IR ANGIOGRAM SELECTIVE EACH ADDITIONAL VESSEL  08/26/2018  . IR ANGIOGRAM SELECTIVE EACH ADDITIONAL VESSEL  08/26/2018  . IR ANGIOGRAM SELECTIVE EACH ADDITIONAL VESSEL  09/13/2018  . IR ANGIOGRAM SELECTIVE EACH ADDITIONAL VESSEL  01/09/2019  . IR ANGIOGRAM VISCERAL SELECTIVE  03/18/2018  . IR ANGIOGRAM VISCERAL SELECTIVE  03/18/2018  . IR ANGIOGRAM VISCERAL SELECTIVE  08/26/2018  . IR ANGIOGRAM VISCERAL SELECTIVE  08/26/2018  . IR ANGIOGRAM VISCERAL SELECTIVE  09/13/2018  . IR ANGIOGRAM VISCERAL SELECTIVE  01/09/2019  . IR EMBO ARTERIAL NOT HEMORR HEMANG INC GUIDE ROADMAPPING  08/26/2018  . IR EMBO TUMOR ORGAN ISCHEMIA INFARCT INC GUIDE ROADMAPPING  03/18/2018  . IR EMBO TUMOR ORGAN ISCHEMIA INFARCT INC GUIDE ROADMAPPING  09/13/2018  . IR EMBO TUMOR ORGAN ISCHEMIA INFARCT INC GUIDE ROADMAPPING  01/09/2019  . IR RADIOLOGIST EVAL & MGMT  02/16/2018  . IR RADIOLOGIST EVAL & MGMT  07/12/2018  . IR US GUIDE VASC ACCESS RIGHT  03/18/2018  . IR US GUIDE VASC ACCESS RIGHT  08/26/2018  . IR US GUIDE VASC ACCESS RIGHT  09/13/2018  . IR US GUIDE VASC ACCESS RIGHT  01/09/2019  . JOINT REPLACEMENT    . PARTIAL HIP ARTHROPLASTY      Current Outpatient Medications  Medication Sig Dispense Refill  . dicyclomine (BENTYL) 10 MG capsule Take 1 capsule (10 mg total) by mouth 4 (four) times daily as needed for spasms (abdominal pain, cramping). 50 capsule 3  . gabapentin (NEURONTIN) 300 MG capsule Take 1 capsule (300 mg total) by mouth 3 (three) times daily. 270 capsule 1  . glipiZIDE (GLUCOTROL) 10 MG tablet TAKE 1 TABLET(10 MG) BY MOUTH TWICE DAILY BEFORE A MEAL 180 tablet 2  . Glucerna (GLUCERNA) LIQD Take 1 Can by mouth 3 (three) times daily between meals. 21330 mL 3  . glucose blood test strip Check blood sugar 3 times per day prior to meals. Use as instructed 100 each 12  .  hydrochlorothiazide (HYDRODIURIL) 25 MG tablet TAKE 1 TABLET(25 MG) BY MOUTH DAILY (Patient taking differently: Take 25 mg by mouth daily. ) 90 tablet 1  . insulin glargine (LANTUS) 100 UNIT/ML injection Inject 0.3 mLs (30 Units total) into the skin at bedtime. 10 mL 11  . Insulin Pen Needle (PEN NEEDLES) 32G X 4 MM MISC 1 packet by Does not apply route 3 (three) times daily as needed. 100 each 2  . lisinopril (PRINIVIL,ZESTRIL) 20 MG tablet Take 1 tablet (20 mg total) by mouth daily. 90 tablet 1  . mesalamine (CANASA) 1000 MG suppository Place 1 suppository (1,000 mg total) rectally 2 (  two) times daily. 60 suppository 0  . metFORMIN (GLUCOPHAGE) 1000 MG tablet TAKE 1 TABLET(1000 MG) BY MOUTH TWICE DAILY 180 tablet 0  . ondansetron (ZOFRAN) 4 MG tablet TAKE 1 TABLET(4 MG) BY MOUTH EVERY 8 HOURS AS NEEDED FOR NAUSEA OR VOMITING 30 tablet 0  . oxyCODONE (OXY IR/ROXICODONE) 5 MG immediate release tablet Take 1 tablet (5 mg total) by mouth every 6 (six) hours as needed for severe pain. 120 tablet 0  . pantoprazole (PROTONIX) 40 MG tablet TAKE 1 TABLET BY MOUTH EVERY MORNING FOR 8 WEEKS 90 tablet 0  . pravastatin (PRAVACHOL) 80 MG tablet Take 1 tablet (80 mg total) by mouth daily. 90 tablet 1  . predniSONE (DELTASONE) 10 MG tablet Take 6 tablets (60 mg total) by mouth daily with breakfast for 7 days, THEN 5 tablets (50 mg total) daily with breakfast for 7 days, THEN 4 tablets (40 mg total) daily with breakfast for 7 days, THEN 3 tablets (30 mg total) daily with breakfast for 7 days, THEN 2 tablets (20 mg total) daily with breakfast for 7 days, THEN 1 tablet (10 mg total) daily with breakfast for 7 days. 147 tablet 0  . prochlorperazine (COMPAZINE) 10 MG tablet Take 1 tablet (10 mg total) by mouth every 6 (six) hours as needed for nausea or vomiting. 45 tablet 3  . Syringe, Disposable, (B-D 30CC SYRINGE) 30 ML MISC 1 Syringe by Does not apply route as directed. 50 each 5   No current facility-administered  medications for this visit.    Allergies as of 08/02/2019 - Review Complete 07/26/2019  Allergen Reaction Noted  . Gadolinium derivatives Hives and Itching 12/10/2017  . Multihance [gadobenate] Itching 12/10/2017    Family History  Problem Relation Age of Onset  . Diabetes Other   . Hypertension Other   . Diabetes Mother   . Liver disease Father     Social History   Socioeconomic History  . Marital status: Single    Spouse name: Not on file  . Number of children: Not on file  . Years of education: Not on file  . Highest education level: Not on file  Occupational History  . Not on file  Tobacco Use  . Smoking status: Current Every Day Smoker    Packs/day: 1.00    Years: 40.00    Pack years: 40.00    Types: Cigarettes  . Smokeless tobacco: Never Used  Substance and Sexual Activity  . Alcohol use: Yes    Alcohol/week: 40.0 - 41.0 standard drinks    Types: 2 - 3 Glasses of wine, 24 Cans of beer, 14 Standard drinks or equivalent per week    Comment: last drink yesterday 08/25/2018 2 40 oz beers  . Drug use: No    Comment: history of iv drugs   . Sexual activity: Yes  Other Topics Concern  . Not on file  Social History Narrative  . Not on file   Social Determinants of Health   Financial Resource Strain:   . Difficulty of Paying Living Expenses: Not on file  Food Insecurity:   . Worried About Charity fundraiser in the Last Year: Not on file  . Ran Out of Food in the Last Year: Not on file  Transportation Needs:   . Lack of Transportation (Medical): Not on file  . Lack of Transportation (Non-Medical): Not on file  Physical Activity:   . Days of Exercise per Week: Not on file  . Minutes of Exercise per  Session: Not on file  Stress:   . Feeling of Stress : Not on file  Social Connections:   . Frequency of Communication with Friends and Family: Not on file  . Frequency of Social Gatherings with Friends and Family: Not on file  . Attends Religious Services: Not on  file  . Active Member of Clubs or Organizations: Not on file  . Attends Archivist Meetings: Not on file  . Marital Status: Not on file  Intimate Partner Violence:   . Fear of Current or Ex-Partner: Not on file  . Emotionally Abused: Not on file  . Physically Abused: Not on file  . Sexually Abused: Not on file    Review of Systems: 12 system ROS is negative except as noted above.   Physical Exam: General:   Alert,  well-nourished, pleasant and cooperative in NAD Head:  Normocephalic and atraumatic. Eyes:  Sclera clear, no icterus.   Conjunctiva pink. Ears:  Normal auditory acuity. Nose:  No deformity, discharge,  or lesions. Mouth:  No deformity or lesions.   Neck:  Supple; no masses or thyromegaly. Lungs:  Clear throughout to auscultation.   No wheezes. Heart:  Regular rate and rhythm; no murmurs. Abdomen:  Soft,nontender, nondistended, normal bowel sounds, no rebound or guarding. No hepatosplenomegaly.   Rectal:  Deferred  Msk:  Symmetrical. No boney deformities LAD: No inguinal or umbilical LAD Extremities:  No clubbing or edema. Neurologic:  Alert and  oriented x4;  grossly nonfocal Skin:  Intact without significant lesions or rashes. Psych:  Alert and cooperative. Normal mood and affect.     L. Tarri Glenn, MD, MPH 08/01/2019, 9:34 PM

## 2019-08-02 ENCOUNTER — Ambulatory Visit: Payer: Medicare Other | Admitting: Gastroenterology

## 2019-08-03 ENCOUNTER — Emergency Department (HOSPITAL_COMMUNITY): Payer: Medicare Other

## 2019-08-03 ENCOUNTER — Encounter (HOSPITAL_COMMUNITY): Payer: Self-pay | Admitting: Emergency Medicine

## 2019-08-03 ENCOUNTER — Emergency Department (HOSPITAL_COMMUNITY)
Admission: EM | Admit: 2019-08-03 | Discharge: 2019-08-04 | Disposition: A | Discharge status: Home / Self Care | Payer: Medicare Other | Attending: Emergency Medicine | Admitting: Emergency Medicine

## 2019-08-03 ENCOUNTER — Other Ambulatory Visit: Payer: Self-pay

## 2019-08-03 DIAGNOSIS — R2243 Localized swelling, mass and lump, lower limb, bilateral: Secondary | ICD-10-CM | POA: Diagnosis not present

## 2019-08-03 DIAGNOSIS — E1165 Type 2 diabetes mellitus with hyperglycemia: Secondary | ICD-10-CM | POA: Insufficient documentation

## 2019-08-03 DIAGNOSIS — R600 Localized edema: Secondary | ICD-10-CM

## 2019-08-03 DIAGNOSIS — Z794 Long term (current) use of insulin: Secondary | ICD-10-CM | POA: Diagnosis not present

## 2019-08-03 DIAGNOSIS — Z79899 Other long term (current) drug therapy: Secondary | ICD-10-CM | POA: Diagnosis not present

## 2019-08-03 DIAGNOSIS — F17210 Nicotine dependence, cigarettes, uncomplicated: Secondary | ICD-10-CM | POA: Insufficient documentation

## 2019-08-03 DIAGNOSIS — I10 Essential (primary) hypertension: Secondary | ICD-10-CM | POA: Diagnosis not present

## 2019-08-03 DIAGNOSIS — R739 Hyperglycemia, unspecified: Secondary | ICD-10-CM

## 2019-08-03 LAB — COMPREHENSIVE METABOLIC PANEL
ALT: 100 U/L — ABNORMAL HIGH (ref 0–44)
AST: 91 U/L — ABNORMAL HIGH (ref 15–41)
Albumin: 2.2 g/dL — ABNORMAL LOW (ref 3.5–5.0)
Alkaline Phosphatase: 358 U/L — ABNORMAL HIGH (ref 38–126)
Anion gap: 10 (ref 5–15)
BUN: 13 mg/dL (ref 6–20)
CO2: 22 mmol/L (ref 22–32)
Calcium: 8.4 mg/dL — ABNORMAL LOW (ref 8.9–10.3)
Chloride: 102 mmol/L (ref 98–111)
Creatinine, Ser: 0.81 mg/dL (ref 0.61–1.24)
GFR calc Af Amer: >60 mL/min (ref >60)
GFR calc non Af Amer: >60 mL/min (ref >60)
Glucose, Bld: 248 mg/dL — ABNORMAL HIGH (ref 70–99)
Potassium: 4.1 mmol/L (ref 3.5–5.1)
Sodium: 134 mmol/L — ABNORMAL LOW (ref 135–145)
Total Bilirubin: 3.8 mg/dL — ABNORMAL HIGH (ref 0.3–1.2)
Total Protein: 5 g/dL — ABNORMAL LOW (ref 6.5–8.1)

## 2019-08-03 LAB — CBC WITH DIFFERENTIAL/PLATELET
Abs Immature Granulocytes: 0.07 10*3/uL (ref 0.00–0.07)
Basophils Absolute: 0 10*3/uL (ref 0.0–0.1)
Basophils Relative: 0 %
Eosinophils Absolute: 0 10*3/uL (ref 0.0–0.5)
Eosinophils Relative: 0 %
HCT: 30.7 % — ABNORMAL LOW (ref 39.0–52.0)
Hemoglobin: 10.4 g/dL — ABNORMAL LOW (ref 13.0–17.0)
Immature Granulocytes: 1 %
Lymphocytes Relative: 3 %
Lymphs Abs: 0.2 10*3/uL — ABNORMAL LOW (ref 0.7–4.0)
MCH: 29.6 pg (ref 26.0–34.0)
MCHC: 33.9 g/dL (ref 30.0–36.0)
MCV: 87.5 fL (ref 80.0–100.0)
Monocytes Absolute: 0.6 10*3/uL (ref 0.1–1.0)
Monocytes Relative: 8 %
Neutro Abs: 7.3 10*3/uL (ref 1.7–7.7)
Neutrophils Relative %: 88 %
Platelets: 192 10*3/uL (ref 150–400)
RBC: 3.51 MIL/uL — ABNORMAL LOW (ref 4.22–5.81)
RDW: 17.3 % — ABNORMAL HIGH (ref 11.5–15.5)
WBC: 8.2 10*3/uL (ref 4.0–10.5)
nRBC: 0 % (ref 0.0–0.2)

## 2019-08-03 LAB — BRAIN NATRIURETIC PEPTIDE: B Natriuretic Peptide: 136.3 pg/mL — ABNORMAL HIGH (ref 0.0–100.0)

## 2019-08-03 LAB — PROTIME-INR
INR: 0.9 (ref 0.8–1.2)
Prothrombin Time: 12.1 seconds (ref 11.4–15.2)

## 2019-08-03 NOTE — Discharge Instructions (Signed)
Today your legs were wrapped.  It is important that you change these if they get saturated.  Elevating your legs will help decrease the swelling. Please elevate your legs.   Please schedule a follow-up appointment with your outpatient doctors in the next week to address this.
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
## 2019-08-03 NOTE — ED Triage Notes (Signed)
Pt. Stated, My legs started swelling yesterday and my feet and yesterday I started getting blisters on them. I have cancer and Im a diabetic.

## 2019-08-04 NOTE — ED Notes (Signed)
Patient verbalizes understanding of discharge instructions. Opportunity for questioning and answers were provided. Armband removed by staff, pt discharged from ED ambulatory by self\

## 2019-08-08 ENCOUNTER — Telehealth: Payer: Self-pay

## 2019-08-08 NOTE — Telephone Encounter (Signed)
[name] called asking if she can put antibiotic ointment on the open areas of [name]'s skin.  I said this would be ok.  I discussed with her the importance of protein intake, ie eggs, peanut butter, and supplements such as ensure and boost.  I also stressed the importance of him coming to his appointment this week.   She verbalized understanding.

## 2019-08-08 NOTE — ED Notes (Signed)
Pt returned requesting copy of AVS from 12/31, states he lost original copy

## 2019-08-10 ENCOUNTER — Ambulatory Visit: Payer: Medicare Other | Admitting: Hematology

## 2019-08-10 ENCOUNTER — Other Ambulatory Visit: Payer: Medicare Other

## 2019-08-10 NOTE — Progress Notes (Signed)
[place]   Telephone:([phone]) [phone] Fax:(336) [phone]   Clinic Follow up Note   Patient Care Team: Rocco Serene, MD as PCP - General (Internal Medicine) Comer, Okey Regal, MD as Consulting Physician (Infectious Diseases) Truitt Merle, MD as Consulting Physician (Hematology)  Date of Service:  08/11/2019  CHIEF COMPLAINT: F/u of [place]  SUMMARY OF ONCOLOGIC HISTORY: Oncology History  Hepatocellular carcinoma ([place])  12/07/2017 Initial Diagnosis   Hepatocellular carcinoma ([place])   12/07/2017 Imaging   US Abdomen 12/07/17 IMPRESSION: ULTRASOUND ABDOMEN: Probable 4 mm gallstone in gallbladder.  Multiple hepatic masses largest heterogeneous RIGHT lobe measuring 6.1 x 5.2 x 5.5 cm; metastatic disease and primary tumor not excluded, recommend further evaluation by MR imaging.  ULTRASOUND HEPATIC ELASTOGRAPHY:  Median hepatic shear wave velocity is calculated at 1.52 m/sec.  Corresponding Metavir fibrosis score is F2 + some F3.  Risk of fibrosis is moderate.  Follow-up: Additional testing appropriate   12/22/2017 PET scan   PET 12/22/17  IMPRESSION: 1. Lesions within the liver have low metabolic activity for size. 2. Dominant lesion in liver does have a focus of metabolic activity which corresponds to a low-density rounded region within the larger lesion. Target this 2 cm focus within the dominant lesion for biopsy. The liver lesions remain concerning for metastatic disease or primary liver carcinoma. Mucinous or cystic carcinomas can have low metabolic activity. 3. Focus of metabolic activity at the level of the hepatic flexure of the colon with some soft tissue thickening not clearly localized. Some additional foci of uptake within colon. This could represent physiologic activity however depending on the biopsy results of the liver recommend colonoscopy.   12/23/2017 Cancer Staging   Staging form: Liver, AJCC 8th Edition - Clinical stage from 12/23/2017: Stage  IIIA (cT3, cN0, cM0) - Signed by Truitt Merle, MD on 12/30/2017   12/23/2017 Initial Biopsy   Diagnosis 12/23/17 Liver, needle/core biopsy, Right lobe - HEPATOCELLULAR CARCINOMA. Microscopic Comment Dr. Lyndon Code has reviewed the case. Dr. Burr Medico was paged on 12/24/2017.     02/18/2018 Imaging   CT AP W WO Contrast 02/18/18  IMPRESSION: 1. Multifocal hepatocellular carcinoma, mildly progressive from baseline MRI 12/10/2017. 2. No evidence of extrahepatic nodal metastases or vascular involvement. 3. Borderline hepatic steatosis. No morphologic changes of underlying cirrhosis or portal hypertension. 4. Cholelithiasis. 5. Mild Aortic Atherosclerosis (ICD10-I70.0).   03/24/2018 Procedure   TACE Chemo ebolization on 03/24/18 with Dr. Kathlene Cote    06/14/2018 Imaging   MRI abdomen 06/14/18  IMPRESSION: 1. Dominant lesion in the central LEFT hepatic lobe (segment 4A) is stable in size, decreased in central enhancement, and with persistent nodular wall enhancement. 2. Multiple additional smaller lesions within LEFT and RIGHT hepatic lobe are increased in size. 3. Potential new lesions on the early arterial phase imaging.   08/16/2018 Imaging   CT Chest 08/16/18   IMPRESSION: Multifocal hepatic masses in this patient with known liver cancer, similar to prior MRI, although poorly evaluated.  No evidence of metastatic disease in the chest.  Aortic Atherosclerosis (ICD10-I70.0) and Emphysema (ICD10-J43.9).   09/13/2018 Procedure   Y90 treatment with Dr. Kathlene Cote on 09/13/18 and 01/09/19   01/29/2019 Imaging    CT AP WO contrast 01/29/19 IMPRESSION: 1. No acute findings within the abdomen or pelvis. 2. Mild decrease in multiple liver masses compared to previous MRI.   02/09/2019 Imaging   MRI Abdomen  IMPRESSION: 1. Today's study demonstrates a mixed response to therapy. Overall, most lesions are decreased in size compared to  the prior examination, with the exception of 2 new lesions in the  liver, as detailed above. Findings remain most compatible with multifocal hepatocellular carcinoma. 2. Hepatic steatosis and hepatic cirrhosis.   02/15/2019 - 04/27/2019 Chemotherapy   Atezolizumab (Tecentriq) and bevacizumab (Avastin) q3weeks starting with cycle 1 Tecentriq only on 02/15/19. Added Avastin with cycle 2. 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    03/02/2019 Procedure   Upper Endoscopy by Dr. Tarri Glenn 03/02/19  IMPRESSION - Irregular z-line suspicious for Barrett's esophagus. - Non-bleeding erosive gastropathy and suspected gastritis. Biopsied. - Small hiatal hernia. - Normal examined duodenum. - No varices or portal hypertensive gastropathy present. 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 - THERE IS NO EVIDENCE OF DYSPLASIA OR MALIGNANCY. - SEE COMMENT.   05/17/2019 Imaging   CT AP IMPRESSION: 1. Mixed appearance in the liver, some lesions are significantly smaller other lesions have enlarged. In terms of overall metastatic burden this essentially balances out. 2. Mildly enlarged peripancreatic lymph node, not well seen previously. 3. Small gallstones. 4.  Prominent stool throughout the colon favors constipation. 5. Cirrhosis likely with geographic fibrosis.     06/09/2019 Procedure   Colonoscopy by Dr. Tarri Glenn IMPRESSION - Mucosal ulceration. Biopsied. - The entire examined colon is normal. Biopsied. - The examined portion of the ileum was normal. - The examination was otherwise normal on direct and retroflexion views. FINAL MICROSCOPIC DIAGNOSIS:   A. COLON, RANDOM RIGHT, BIOPSY:  - Benign colonic mucosa.  - No active inflammation or evidence of microscopic colitis.  - No dysplasia or malignancy.   B. COLON, RANDOM TRANSVERSE, BIOPSY:  - Focal active colitis, see comment.  - No dysplasia or malignancy.   C. COLON, RANDOM LEFT, BIOPSY:  - Benign colonic mucosa.    - No active inflammation or evidence of microscopic colitis.  - No dysplasia or malignancy.   D. RECTUM, RANDOM, BIOPSY:  - Chronic mildly active colitis, see comment.  - No dysplasia or malignancy.    06/09/2019 Pathology Results   COLONOSCOPY - FINAL MICROSCOPIC DIAGNOSIS:   A. COLON, RANDOM RIGHT, BIOPSY:  - Benign colonic mucosa.  - No active inflammation or evidence of microscopic colitis.  - No dysplasia or malignancy.   B. COLON, RANDOM TRANSVERSE, BIOPSY:  - Focal active colitis, see comment.  - No dysplasia or malignancy.   C. COLON, RANDOM LEFT, BIOPSY:  - Benign colonic mucosa.  - No active inflammation or evidence of microscopic colitis.  - No dysplasia or malignancy.   D. RECTUM, RANDOM, BIOPSY:  - Chronic mildly active colitis, see comment.  - No dysplasia or malignancy.   COMMENT:   B. and D. Part B reveals only focal active inflammation with no evidence  of chronicity. Part D has more significant active inflammation with only  a focal area having a dense lymphocytic infiltrate. Given the patient's  history the main differential consideration is medication/chemotherapy  effect. Clinical correlation is recommended.       CURRENT THERAPY:  Palliative and supportive care   INTERVAL HISTORY:  Christopher HARTSHORN is here for a follow up. He presents to the clinic alone.  He called his wife to be included in visit. He notes b/l lower leg swelling with leaking.  He had several wounds around lateral ankles and has been using neosporin on this. Per his wife this started on 08/03/19. His wife wrapped his leg last night. He notes his diarrhea improved. Stool  is now semi-formed. He used a suppository last night. He completed his prednisone taper. He notes his BG has been fluctuating since being on Prednisone and requests another One Touch Starter Kit to monitor it.  He notes mild diffuse abdominal pain. He also notes jaundice of eyes and dark urine. He has been taking  oxycodone 62m q4hours. He needs a refill soon.    REVIEW OF SYSTEMS:   Constitutional: Denies fevers, chills or abnormal weight loss Eyes: Denies blurriness of vision (+) Jaundice of eyes  Ears, nose, mouth, throat, and face: Denies mucositis or sore throat Respiratory: Denies cough, dyspnea or wheezes Cardiovascular: Denies palpitation, chest discomfort (+) B/l lower extremity swelling Gastrointestinal:  Denies nausea, heartburn (+) Diarrhea improved  Skin: Denies abnormal skin rashes Lymphatics: Denies new lymphadenopathy or easy bruising Neurological:Denies numbness, tingling or new weaknesses Behavioral/Psych: Mood is stable, no new changes  All other systems were reviewed with the patient and are negative.  MEDICAL HISTORY:  Past Medical History:  Diagnosis Date  . Chest pain    pt states when he turned over on other side aided in relief  . Diabetes mellitus   . Dyspnea    increased activity  . Elevated cholesterol   . Hepatitis C   . History of colitis 2020  . Hypertension   . [place]    SURGICAL HISTORY: Past Surgical History:  Procedure Laterality Date  . BIOPSY  06/09/2019   Procedure: BIOPSY;  Surgeon: BThornton Park MD;  Location: [facility];  Service: Gastroenterology;;  . COLONOSCOPY WITH PROPOFOL N/A 06/09/2019   Procedure: COLONOSCOPY WITH PROPOFOL;  Surgeon: BThornton Park MD;  Location: [facility];  Service: Gastroenterology;  Laterality: N/A;  . I & D EXTREMITY  06/28/2011   Procedure: IRRIGATION AND DEBRIDEMENT EXTREMITY;  Surgeon: FLinna Hoff  [facility]  Service: Orthopedics;  Laterality: Left;  with application of wound vac  . INCISION AND DRAINAGE OF WOUND  07/02/2011   Procedure: IRRIGATION AND DEBRIDEMENT WOUND;  Surgeon: FLinna Hoff  [facility] [facility]  Service: Orthopedics;  Laterality: Left;  Irrigation and Debridement of left arm wound with  Skin Grafting from left thigh   . IR ANGIOGRAM SELECTIVE EACH  ADDITIONAL VESSEL  03/18/2018  . IR ANGIOGRAM SELECTIVE EACH ADDITIONAL VESSEL  08/26/2018  . IR ANGIOGRAM SELECTIVE EACH ADDITIONAL VESSEL  08/26/2018  . IR ANGIOGRAM SELECTIVE EACH ADDITIONAL VESSEL  08/26/2018  . IR ANGIOGRAM SELECTIVE EACH ADDITIONAL VESSEL  08/26/2018  . IR ANGIOGRAM SELECTIVE EACH ADDITIONAL VESSEL  09/13/2018  . IR ANGIOGRAM SELECTIVE EACH ADDITIONAL VESSEL  01/09/2019  . IR ANGIOGRAM VISCERAL SELECTIVE  03/18/2018  . IR ANGIOGRAM VISCERAL SELECTIVE  03/18/2018  . IR ANGIOGRAM VISCERAL SELECTIVE  08/26/2018  . IR ANGIOGRAM VISCERAL SELECTIVE  08/26/2018  . IR ANGIOGRAM VISCERAL SELECTIVE  09/13/2018  . IR ANGIOGRAM VISCERAL SELECTIVE  01/09/2019  . IR EMBO ARTERIAL NOT HEMORR HEMANG INC GUIDE ROADMAPPING  08/26/2018  . IR EMBO TUMOR ORGAN ISCHEMIA INFARCT INC GUIDE ROADMAPPING  03/18/2018  . IR EMBO TUMOR ORGAN ISCHEMIA INFARCT INC GUIDE ROADMAPPING  09/13/2018  . IR EMBO TUMOR ORGAN ISCHEMIA INFARCT INC GUIDE ROADMAPPING  01/09/2019  . IR RADIOLOGIST EVAL & MGMT  02/16/2018  . IR RADIOLOGIST EVAL & MGMT  07/12/2018  . IR [place] ACCESS RIGHT  03/18/2018  . IR [place] VASC ACCESS RIGHT  08/26/2018  . IR [place] VASC ACCESS RIGHT  09/13/2018  . IR [place] VASC  ACCESS RIGHT  01/09/2019  . JOINT REPLACEMENT    . PARTIAL HIP ARTHROPLASTY      I have reviewed the social history and family history with the patient and they are unchanged from previous note.  ALLERGIES:  is allergic to gadolinium derivatives and multihance [gadobenate].  MEDICATIONS:  Current Outpatient Medications  Medication Sig Dispense Refill  . dicyclomine (BENTYL) 10 MG capsule Take 1 capsule (10 mg total) by mouth 4 (four) times daily as needed for spasms (abdominal pain, cramping). 50 capsule 3  . gabapentin (NEURONTIN) 300 MG capsule Take 1 capsule (300 mg total) by mouth 3 (three) times daily. 270 capsule 1  . glipiZIDE (GLUCOTROL) 10 MG tablet TAKE 1 TABLET(10 MG) BY MOUTH TWICE DAILY BEFORE A MEAL 180  tablet 2  . Glucerna (GLUCERNA) LIQD Take 1 Can by mouth 3 (three) times daily between meals. 21330 mL 3  . glucose blood test strip Check blood sugar 3 times per day prior to meals. Use as instructed 100 each 12  . hydrochlorothiazide (HYDRODIURIL) 25 MG tablet TAKE 1 TABLET(25 MG) BY MOUTH DAILY (Patient taking differently: Take 25 mg by mouth daily. ) 90 tablet 1  . insulin glargine (LANTUS) 100 UNIT/ML injection Inject 0.3 mLs (30 Units total) into the skin at bedtime. 10 mL 11  . Insulin Pen Needle (PEN NEEDLES) 32G X 4 MM MISC 1 packet by Does not apply route 3 (three) times daily as needed. 100 each 2  . lisinopril (PRINIVIL,ZESTRIL) 20 MG tablet Take 1 tablet (20 mg total) by mouth daily. 90 tablet 1  . mesalamine (CANASA) 1000 MG suppository Place 1 suppository (1,000 mg total) rectally 2 (two) times daily. 60 suppository 0  . metFORMIN (GLUCOPHAGE) 1000 MG tablet TAKE 1 TABLET(1000 MG) BY MOUTH TWICE DAILY 180 tablet 0  . ondansetron (ZOFRAN) 4 MG tablet TAKE 1 TABLET(4 MG) BY MOUTH EVERY 8 HOURS AS NEEDED FOR NAUSEA OR VOMITING 30 tablet 0  . oxyCODONE (OXY IR/ROXICODONE) 5 MG immediate release tablet Take 1 tablet (5 mg total) by mouth every 6 (six) hours as needed for severe pain. 120 tablet 0  . pantoprazole (PROTONIX) 40 MG tablet TAKE 1 TABLET BY MOUTH EVERY MORNING FOR 8 WEEKS 90 tablet 0  . pravastatin (PRAVACHOL) 80 MG tablet Take 1 tablet (80 mg total) by mouth daily. 90 tablet 1  . predniSONE (DELTASONE) 10 MG tablet Take 6 tablets (60 mg total) by mouth daily with breakfast for 7 days, THEN 5 tablets (50 mg total) daily with breakfast for 7 days, THEN 4 tablets (40 mg total) daily with breakfast for 7 days, THEN 3 tablets (30 mg total) daily with breakfast for 7 days, THEN 2 tablets (20 mg total) daily with breakfast for 7 days, THEN 1 tablet (10 mg total) daily with breakfast for 7 days. 147 tablet 0  . prochlorperazine (COMPAZINE) 10 MG tablet Take 1 tablet (10 mg total) by  mouth every 6 (six) hours as needed for nausea or vomiting. 45 tablet 3  . Syringe, Disposable, (B-D 30CC SYRINGE) 30 ML MISC 1 Syringe by Does not apply route as directed. 50 each 5  . blood glucose meter kit and supplies KIT Dispense based on patient and insurance preference. Use up to four times daily as directed. (FOR ICD-9 250.00, 250.01). 1 each 0   No current facility-administered medications for this visit.    PHYSICAL EXAMINATION: ECOG PERFORMANCE STATUS: 3 - Symptomatic, >50% confined to bed  Vitals:   08/11/19 1330  BP: 133/82  Pulse: (!) 105  Resp: 18  Temp: 98.3 F (36.8 C)  SpO2: 100%   Filed Weights   08/11/19 1330  Weight: 154 lb 12.8 oz (70.2 kg)    GENERAL:alert, no distress and comfortable SKIN: see picture below  EYES: normal, Conjunctiva are pink and non-injected (+) Jaundice of eyes   NECK: supple, thyroid normal size, non-tender, without nodularity LYMPH:  no palpable lymphadenopathy in the cervical, axillary   LUNGS: clear to auscultation and percussion with normal breathing effort HEART: regular rate & rhythm and no murmurs (+) B/l  lower extremity edema with skin pealing (+) B/l ankle ulcers with open wound as pictured below.  ABDOMEN:abdomen soft, non-tender and normal bowel sounds Musculoskeletal:no cyanosis of digits and no clubbing  NEURO: alert & oriented x 3 with fluent speech, no focal motor/sensory deficits      LABORATORY DATA:  I have reviewed the data as listed CBC Latest Ref Rng & Units 08/11/2019 08/03/2019 07/05/2019  WBC 4.0 - 10.5 K/uL 6.2 8.2 6.5  Hemoglobin 13.0 - 17.0 g/dL 11.1(L) 10.4(L) 13.1  Hematocrit 39.0 - 52.0 % 31.1(L) 30.7(L) 38.9(L)  Platelets 150 - 400 K/uL 186 192 304.0     CMP Latest Ref Rng & Units 08/11/2019 08/03/2019 07/05/2019  Glucose 70 - 99 mg/dL 119(H) 248(H) 129(H)  BUN 6 - 20 mg/dL 14 13 17   Creatinine 0.61 - 1.24 mg/dL 0.97 0.81 0.79  Sodium 135 - 145 mmol/L 134(L) 134(L) 137  Potassium 3.5 - 5.1  mmol/L 3.9 4.1 3.9  Chloride 98 - 111 mmol/L 97(L) 102 103  CO2 22 - 32 mmol/L 23 22 25   Calcium 8.9 - 10.3 mg/dL 7.8(L) 8.4(L) 8.9  Total Protein 6.5 - 8.1 g/dL 5.5(L) 5.0(L) 6.3  Total Bilirubin 0.3 - 1.2 mg/dL 13.7(HH) 3.8(H) 3.7(H)  Alkaline Phos 38 - 126 U/L 431(H) 358(H) 241(H)  AST 15 - 41 U/L 105(H) 91(H) 44(H)  ALT 0 - 44 U/L 73(H) 100(H) 39      RADIOGRAPHIC STUDIES: I have personally reviewed the radiological images as listed and agreed with the findings in the report. No results found.   ASSESSMENT & PLAN:  [name] is a 54 y.o. male with    1.Hepatocellular carcinoma,stage IIIA (cT3N0M0) -He was diagnosed in 12/2017. He is s/p TACE Chemo embolization on 03/24/18 with Dr. Kathlene Cote.  -His repeat MRI in 06/14/18 showed disease progression in liver, except the treated lesion in 4A.No extrahepatic disease on the recent abdominal MRI. We discussed that his liver cancer is not curable at this stage, andgoal of therapy is to control disease andprolong his life. -He underwent Y90 treatmentson2/11/20 and 01/09/19.  -After latest Y90, he has worsening RUQ abdominal pain. He has hepatomegaly with significant tenderness on prior exam.  -Given disease progression in liver based on MRI from 02/2019, I started him on atezolizumab (Tecentriq) and bevacizumab (Avastin) q3weeks. Started cycle 1 with Tecentriq only on 7/15/20andaddAvastinwith cycle 2.  -Colonoscopy from Dr. Tarri Glenn showed colitis in the transverse colon and rectum. The colitis was felt to be immune-related, likely due to tecentriq. We stopped this treatment after getting 4 cycles. He was treated with prednisone, and took a few month to improve -I previously discussed TKI and recommended him to try Lenvatinib but he declined because he was not feeling well -His diarrhea has resolved lately, but he develp He also has recent significant LE edema with weeping and skin breakdown. He also has jaundice of eyes. Tbili 13.7,  AST 105,  ALT 73, Alk phos 431. This is indicating liver failure, likely from his liver cancer progression. At this point it is likely not reversal.  -I discussed not proceeding with anymore treatment given his poor liver function. Without being on active treatment he is eligible for hospice home care to help manage his symptoms. I discussed the hospice service in detail with patient and his wife on the phone, after lengthy discussion, he agrees to proceed.  We will make a referral today.   -I will continue to manage his care medications. F/u open.    2. B/l LE edema and B/l ankle ulcers with open wound.  -Since a few days before 08/03/19 he has had LE edema. This has progressed with weeping. -On exam today there is b/l  lower extremity edema with skin pealing. There is also b/l ankle ulcers with open wound.   -He has not had wound care since ED on 08/03/19. Him and his wife have been changing dressing and wrapping his legs.  -We will re-wrap his wound and legs. I encouraged him to elevated legs when sitting at home. With start of hospice care there will help manage his wound care.    2. Abdominal pain, nausea and constipation, Colitis  -Abdominal pain related to his liver cancer  -He has chronic constipation, however he has developed new diarrhea and worsening hematochezia -His endoscopy/colonoscopy with Dr Tarri Glenn from 06/09/19 shows colitis in the transverse colon and rectum. The colitis was felt to be immune-related, likely due to tecentriq. We stopped tecentriq.  -He was being treated with prednisone taper and mesalamine enema per GI.  -His abdominal pain is more diffuse. He is currently on Oxycodone 18m q4hours, but feels he may need to increase to 144m Will monitor and refill next week.    3. Hepatitis C, untreated, H. Pylorie -He was diagnosed with Hepatitis Cin 09/2017, however he is unsure of how long he has had hepatitis C prior to his diagnosis.  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 -Dr. CoBecky Augustaot recommendtreatment until Cancer is controlled. -No significant liver cirrhosis on MRI. 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 S/p treatment by GI, H. Pylorie is resolved.   4.DM, HTN -Patient is insulin dependent diabetic and on Lantus, not well controlled. He is also on Lisinopril.  -contiue to f/u with PCP -Since recent prednisone taper, his BG has been fluctuating. Per his request will given written prescription for One touch starter kit today (08/11/19)   6. Goal of care discussion  -The patient understands the goal of care is palliative. -I recommend hospice and DNI    Plan -due to his liver failure and poor PS, I do not think he is a candidate for more cancer treatment -Send hospice referral  -I gave prescription for One Touch starter Kit per his request  -F/u open  -I spoke with his wife today    No problem-specific Assessment & Plan notes found for this encounter.   Orders Placed This Encounter  Procedures  . Glucose, capillary  . Ambulatory referral to Hospice    Referral Priority:   Routine    Referral Type:   Consultation    Referral Reason:   Specialty Services Required    Requested Specialty:   Hospice Services    Number of Visits Requested:   1   All questions were answered. The patient knows to call the  clinic with any problems, questions or concerns. No barriers to learning was detected. The total time spent in the appointment was 45 minutes.     Truitt Merle, MD 08/11/2019   I, Joslyn Devon, am acting as scribe for Truitt Merle, MD.   I have reviewed the above documentation for accuracy and completeness, and I agree with the above.

## 2019-08-11 ENCOUNTER — Encounter: Payer: Self-pay | Admitting: Hematology

## 2019-08-11 ENCOUNTER — Inpatient Hospital Stay (HOSPITAL_BASED_OUTPATIENT_CLINIC_OR_DEPARTMENT_OTHER): Payer: Medicare Other | Admitting: Hematology

## 2019-08-11 ENCOUNTER — Inpatient Hospital Stay: Payer: Medicare Other | Attending: Hematology

## 2019-08-11 ENCOUNTER — Other Ambulatory Visit: Payer: Self-pay

## 2019-08-11 VITALS — BP 133/82 | HR 105 | Temp 98.3°F | Resp 18 | Ht 67.0 in | Wt 154.8 lb

## 2019-08-11 DIAGNOSIS — I700 Atherosclerosis of aorta: Secondary | ICD-10-CM | POA: Insufficient documentation

## 2019-08-11 DIAGNOSIS — E7800 Pure hypercholesterolemia, unspecified: Secondary | ICD-10-CM | POA: Diagnosis not present

## 2019-08-11 DIAGNOSIS — Z9221 Personal history of antineoplastic chemotherapy: Secondary | ICD-10-CM | POA: Diagnosis not present

## 2019-08-11 DIAGNOSIS — C220 Liver cell carcinoma: Secondary | ICD-10-CM

## 2019-08-11 DIAGNOSIS — Z7952 Long term (current) use of systemic steroids: Secondary | ICD-10-CM | POA: Insufficient documentation

## 2019-08-11 DIAGNOSIS — K760 Fatty (change of) liver, not elsewhere classified: Secondary | ICD-10-CM | POA: Diagnosis not present

## 2019-08-11 DIAGNOSIS — B1920 Unspecified viral hepatitis C without hepatic coma: Secondary | ICD-10-CM | POA: Diagnosis not present

## 2019-08-11 DIAGNOSIS — J439 Emphysema, unspecified: Secondary | ICD-10-CM | POA: Diagnosis not present

## 2019-08-11 DIAGNOSIS — I10 Essential (primary) hypertension: Secondary | ICD-10-CM | POA: Insufficient documentation

## 2019-08-11 DIAGNOSIS — Z79899 Other long term (current) drug therapy: Secondary | ICD-10-CM | POA: Diagnosis not present

## 2019-08-11 DIAGNOSIS — Z794 Long term (current) use of insulin: Secondary | ICD-10-CM | POA: Diagnosis not present

## 2019-08-11 DIAGNOSIS — E119 Type 2 diabetes mellitus without complications: Secondary | ICD-10-CM | POA: Diagnosis not present

## 2019-08-11 LAB — CBC WITH DIFFERENTIAL (CANCER CENTER ONLY)
Abs Immature Granulocytes: 0.06 10*3/uL (ref 0.00–0.07)
Basophils Absolute: 0 10*3/uL (ref 0.0–0.1)
Basophils Relative: 1 %
Eosinophils Absolute: 0 10*3/uL (ref 0.0–0.5)
Eosinophils Relative: 1 %
HCT: 31.1 % — ABNORMAL LOW (ref 39.0–52.0)
Hemoglobin: 11.1 g/dL — ABNORMAL LOW (ref 13.0–17.0)
Immature Granulocytes: 1 %
Lymphocytes Relative: 8 %
Lymphs Abs: 0.5 10*3/uL — ABNORMAL LOW (ref 0.7–4.0)
MCH: 29.4 pg (ref 26.0–34.0)
MCHC: 35.7 g/dL (ref 30.0–36.0)
MCV: 82.3 fL (ref 80.0–100.0)
Monocytes Absolute: 0.6 10*3/uL (ref 0.1–1.0)
Monocytes Relative: 10 %
Neutro Abs: 4.9 10*3/uL (ref 1.7–7.7)
Neutrophils Relative %: 79 %
Platelet Count: 186 10*3/uL (ref 150–400)
RBC: 3.78 MIL/uL — ABNORMAL LOW (ref 4.22–5.81)
RDW: 18.2 % — ABNORMAL HIGH (ref 11.5–15.5)
WBC Count: 6.2 10*3/uL (ref 4.0–10.5)
nRBC: 0 % (ref 0.0–0.2)

## 2019-08-11 LAB — GLUCOSE, CAPILLARY: Glucose-Capillary: 118 mg/dL — ABNORMAL HIGH (ref 70–99)

## 2019-08-11 LAB — CMP (CANCER CENTER ONLY)
ALT: 73 U/L — ABNORMAL HIGH (ref 0–44)
AST: 105 U/L — ABNORMAL HIGH (ref 15–41)
Albumin: 2.1 g/dL — ABNORMAL LOW (ref 3.5–5.0)
Alkaline Phosphatase: 431 U/L — ABNORMAL HIGH (ref 38–126)
Anion gap: 14 (ref 5–15)
BUN: 14 mg/dL (ref 6–20)
CO2: 23 mmol/L (ref 22–32)
Calcium: 7.8 mg/dL — ABNORMAL LOW (ref 8.9–10.3)
Chloride: 97 mmol/L — ABNORMAL LOW (ref 98–111)
Creatinine: 0.97 mg/dL (ref 0.61–1.24)
GFR, Est AFR Am: >60 mL/min (ref >60)
GFR, Estimated: >60 mL/min (ref >60)
Glucose, Bld: 119 mg/dL — ABNORMAL HIGH (ref 70–99)
Potassium: 3.9 mmol/L (ref 3.5–5.1)
Sodium: 134 mmol/L — ABNORMAL LOW (ref 135–145)
Total Bilirubin: 13.7 mg/dL (ref 0.3–1.2)
Total Protein: 5.5 g/dL — ABNORMAL LOW (ref 6.5–8.1)

## 2019-08-11 MED ORDER — BLOOD GLUCOSE MONITOR KIT: PACK | 0 refills | Status: AC

## 2019-08-11 NOTE — Progress Notes (Signed)
Patient c/o of feeling like his BP is low, CBG 118, patient given orange juice and peanut butter crackers, VS stable, call bell given to patient.    CRITICAL VALUE STICKER  CRITICAL VALUE:  Bili 13.7  RECEIVER (on-site recipient of call): Ihor Gully RN  [place] NOTIFIED: 08/11/2019  1408 MESSENGER (representative from lab):  MD NOTIFIED:  Dr. Burr Medico   TIME OF NOTIFICATION:1408  RESPONSE:

## 2019-08-12 LAB — AFP TUMOR MARKER: AFP, Serum, Tumor Marker: 58.1 ng/mL — ABNORMAL HIGH (ref 0.0–8.3)

## 2019-08-13 ENCOUNTER — Other Ambulatory Visit: Payer: Self-pay | Admitting: Hematology

## 2019-08-14 ENCOUNTER — Telehealth: Payer: Self-pay | Admitting: Hematology

## 2019-08-14 ENCOUNTER — Telehealth: Payer: Self-pay

## 2019-08-14 NOTE — Telephone Encounter (Signed)
No los per 1/8.

## 2019-08-14 NOTE — Telephone Encounter (Signed)
REFERRAL FOR HOSPICE WAS FAXED ON 08/11/2019 TO [facility]

## 2019-08-15 ENCOUNTER — Telehealth: Payer: Self-pay

## 2019-08-15 NOTE — Telephone Encounter (Signed)
[name] called requesting oxycodone refill.  Message sent to Cira Rue, NP

## 2019-08-31 ENCOUNTER — Telehealth: Payer: Self-pay

## 2019-08-31 NOTE — Telephone Encounter (Signed)
Faxed back signed orders for [facility] to fax [phone], sent to HIM for scan to chart.

## 2019-10-13 ENCOUNTER — Telehealth: Payer: Self-pay | Admitting: *Deleted

## 2019-10-13 NOTE — Telephone Encounter (Signed)
Received call from Teresa/[facility] stating pt is up for recertification & wants order to cont services.  She states that pt appears to be actively dying & may happen over w/e.  Dr Burr Medico informed & OK to cont services given to [facility].

## 2019-10-24 ENCOUNTER — Other Ambulatory Visit: Payer: Self-pay | Admitting: Family Medicine

## 2019-10-24 DIAGNOSIS — E118 Type 2 diabetes mellitus with unspecified complications: Secondary | ICD-10-CM

## 2019-10-27 IMAGING — MR MRI ABDOMEN WITH AND WITHOUT CONTRAST
9 of 18 series · 20 of 48 positions shown · IV contrast (gadavist)
Comparison: MRI of the abdomen 06/14/2018. Noncontrast CT the
abdomen and pelvis 01/21/2019.

CLINICAL DATA: 53-year-old male with history of hepatocellular
carcinoma status post locoregional or systemic therapy. Evaluate for
treatment response.

EXAM:
MRI ABDOMEN WITHOUT AND WITH CONTRAST
TECHNIQUE: Multiplanar multisequence MR imaging of the abdomen was performed
both before and after the administration of intravenous contrast.
CONTRAST:  7.5 mL of Gadavist.

[Series 4: T2 fat-sat · axial · 5.0mm · 0.78mm/px · z∈[-4,+236]mm · 3 of 49 slices shown]
[im 1/49]
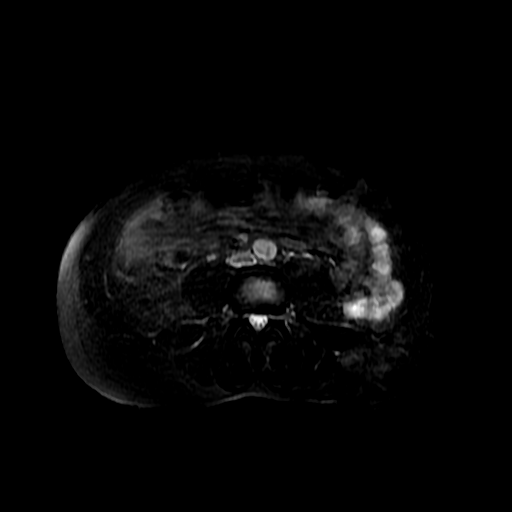
[im 25/49]
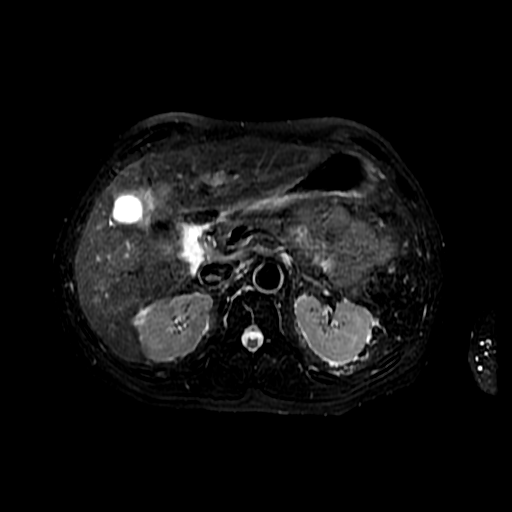
[im 49/49]
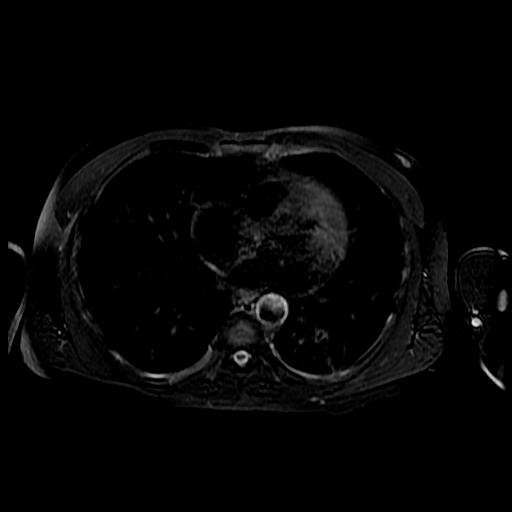

[Series 5: DWI b500 · axial · 6.0mm · 1.48mm/px · z∈[+4,+254]mm · 2 of 66 slices shown]
[im 1/66]
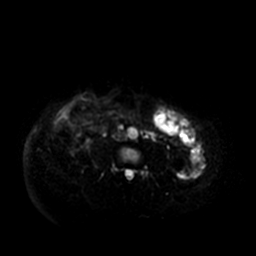
[im 66/66]
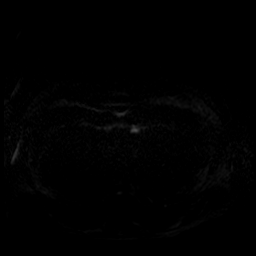

[Series 6: T2 · axial · 5.0mm · 0.78mm/px · z∈[+1,+256]mm · 2 of 52 slices shown (1 of 2)]
[im 1/52]
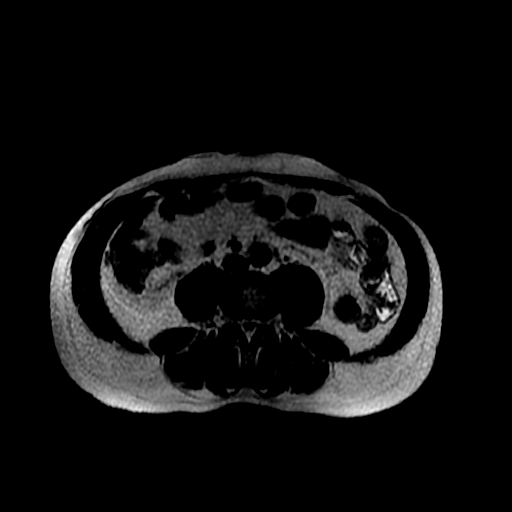
[im 52/52]
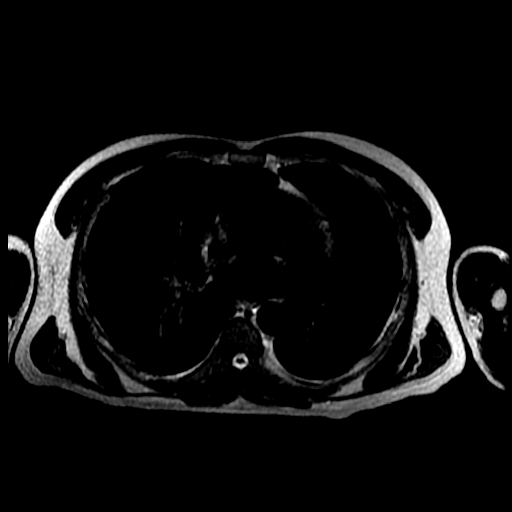

[Series 7: T2 · coronal · 5.0mm · 0.78mm/px · 2 of 47 slices shown (2 of 2)]
[im 1/47]
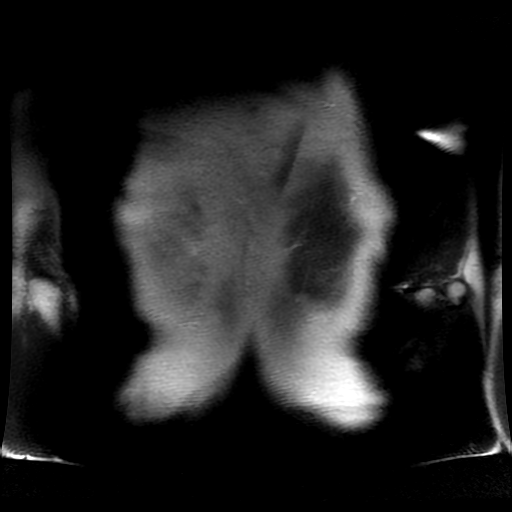
[im 47/47]
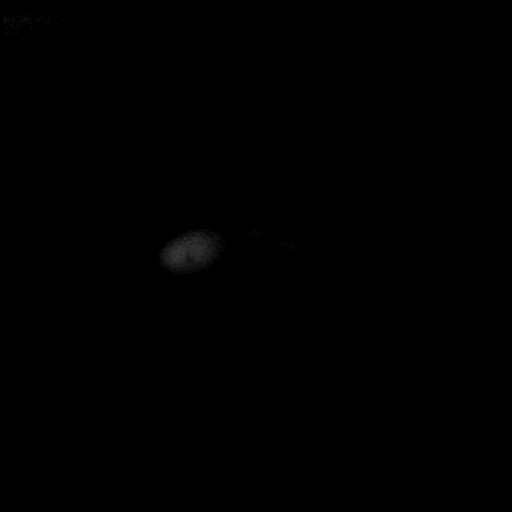

[Series 8: bSSFP · axial · 5.0mm · 0.78mm/px · z∈[+1,+256]mm · 2 of 52 slices shown]
[im 1/52]
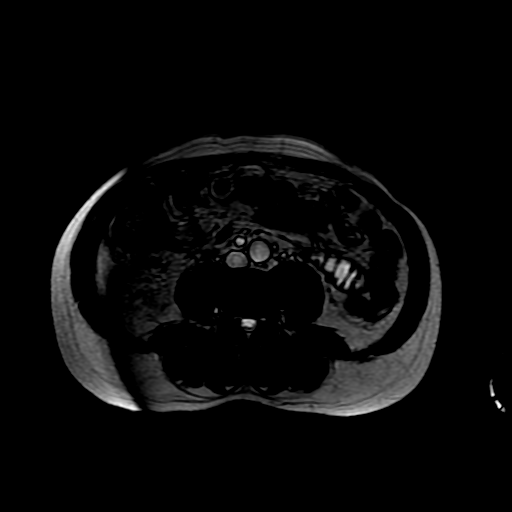
[im 52/52]
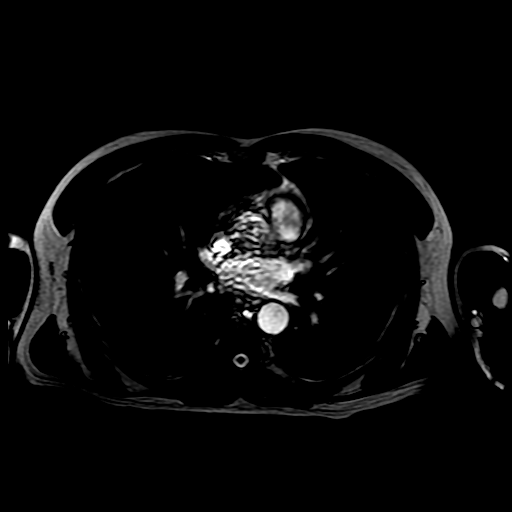

[Series 9: ax dualecho bh · axial · 5.0mm · 0.78mm/px · z∈[-4,+236]mm · 3 of 98 slices shown]
[im 1/98]
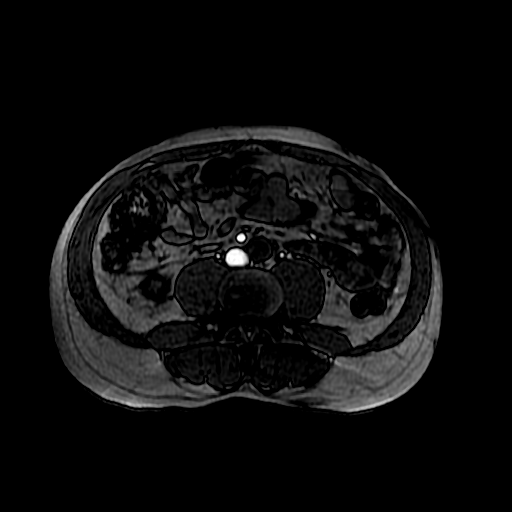
[im 49/98]
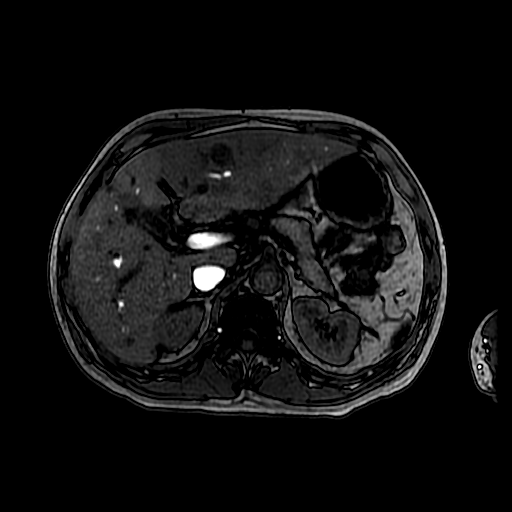
[im 98/98]
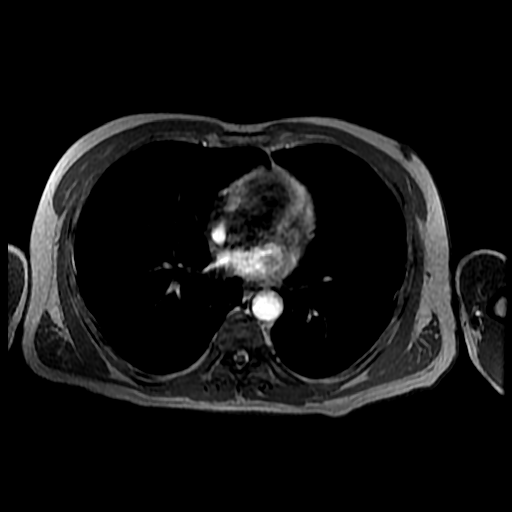

[Series 500: DWI · axial · 6.0mm · 1.48mm/px · 1 of 33 slices shown]
[im 1/33]
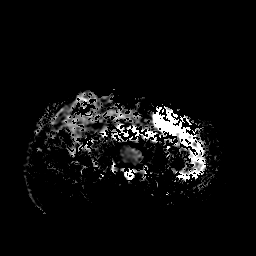

[Series 1000: T1 dynamic · axial · 5.6mm · 0.78mm/px · z∈[+7,+250]mm · 3 of 88 slices shown (1 of 2)]
[im 1/88]
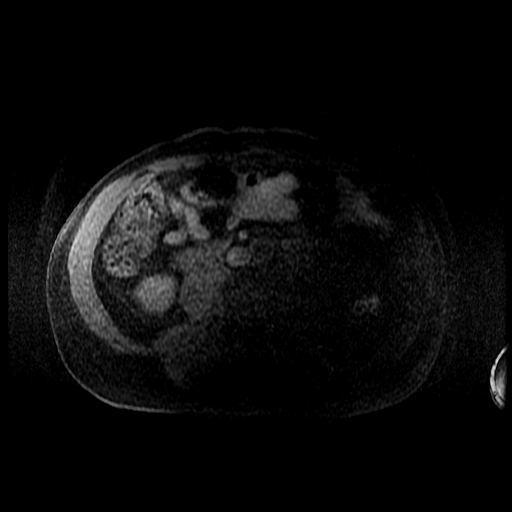
[im 44/88]
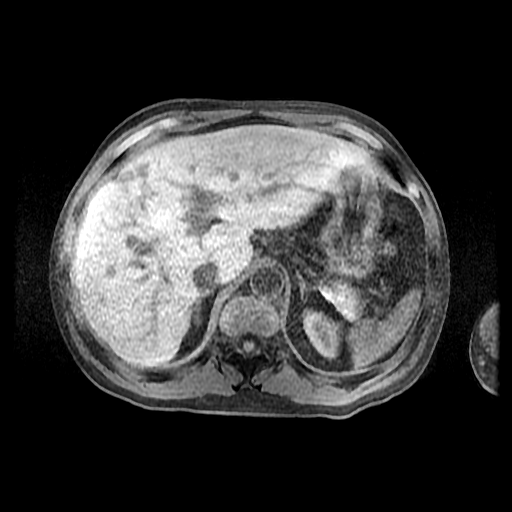
[im 88/88]
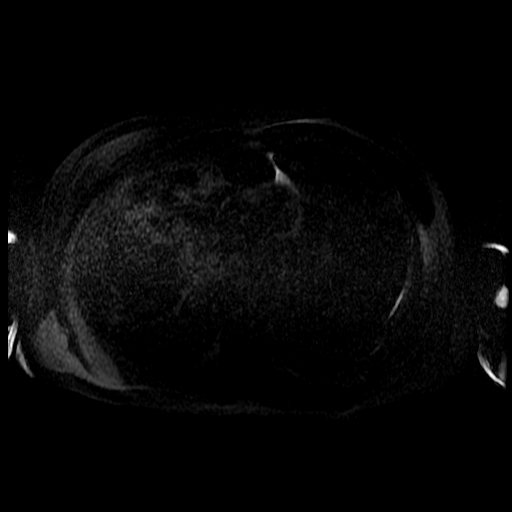

[Series 1001: T1 dynamic · axial · 5.6mm · 0.78mm/px · z∈[+7,+127]mm · 2 of 88 slices shown (2 of 2)]
[im 1/88]
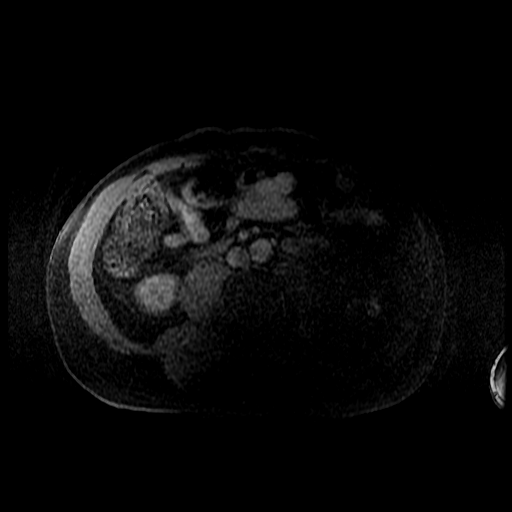
[im 44/88]
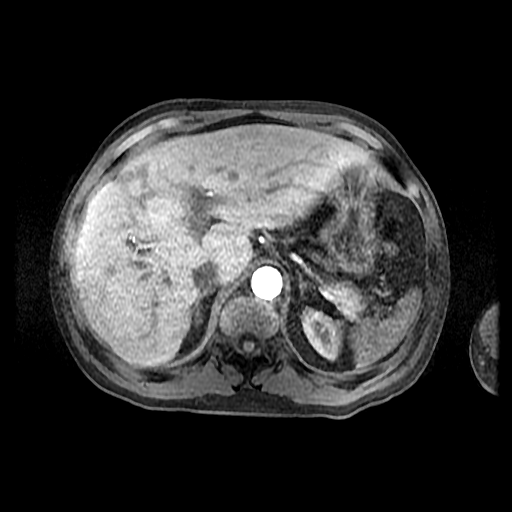

[20 of 48 positions shown; findings below may reference images not displayed]

FINDINGS: Lower chest: Unremarkable.

Hepatobiliary: Mild diffuse loss of signal intensity throughout the
hepatic parenchyma on out of phase dual echo images, indicative of a
background of hepatic steatosis. Liver has a slightly shrunken
appearance and nodular contour, indicative of a background of
cirrhosis. Multiple hepatic lesions are again noted, most of which
are T1 hypointense, slightly T2 hyperintense, and demonstrate
various degrees of enhancement but are generally centrally
hypovascular with predominantly peripheral enhancement. The largest
of these in segment 4A/8 is decreased in size compared to the prior
examination, currently measuring 4.1 x 3.6 cm (previously 4.2 x
cm on 06/14/2018). The majority of the smaller lesions also appear
generally decreased in size compared to the prior examination.
However, there are some new lesions. This includes a 2.2 x 1.8 cm
lesion in segment 8 of the liver just medial to the largest lesion
(axial image 36 of series [DATE]) which is not confidently identified
on the prior examination. There is also a 2.5 x 1.9 cm lesion in
segment 3 (axial image 50 of series [DATE]) which was also not
confidently identified on the prior study. No intra or extrahepatic
biliary ductal dilatation. Gallbladder is normal in appearance.

Pancreas: No pancreatic mass. No pancreatic ductal dilatation. No
pancreatic or peripancreatic fluid collections or inflammatory
changes.

Spleen:  Unremarkable.

Adrenals/Urinary Tract: Bilateral kidneys and adrenal glands are
normal in appearance. No hydroureteronephrosis in the visualized
portions of the abdomen.

Stomach/Bowel: Visualized portions are unremarkable.

Vascular/Lymphatic: No aneurysm identified in the visualized
abdominal vasculature. Portal vein remains patent. No
lymphadenopathy noted in the abdomen.

Other: No significant volume of ascites noted in the visualized
portions of the peritoneal cavity.

Musculoskeletal: No aggressive appearing osseous lesions are noted
in the visualized portions of the skeleton.
IMPRESSION: 1. Today's study demonstrates a mixed response to therapy. Overall,
most lesions are decreased in size compared to the prior
examination, with the exception of 2 new lesions in the liver, as
detailed above. Findings remain most compatible with multifocal
hepatocellular carcinoma.
2. Hepatic steatosis and hepatic cirrhosis.

## 2019-10-30 ENCOUNTER — Other Ambulatory Visit: Payer: Self-pay | Admitting: Emergency Medicine

## 2019-10-30 DIAGNOSIS — K259 Gastric ulcer, unspecified as acute or chronic, without hemorrhage or perforation: Secondary | ICD-10-CM

## 2019-10-30 MED ORDER — PANTOPRAZOLE SODIUM 40 MG PO TBEC: DELAYED_RELEASE_TABLET | ORAL | 0 refills | Status: AC

## 2019-10-30 NOTE — Telephone Encounter (Signed)
Rx request sent via fax and sent to pharmacy.

## 2019-11-02 DEATH — deceased

## 2023-06-23 NOTE — Telephone Encounter (Signed)
Telephone call
